# Patient Record
Sex: Male | Born: 1965 | Race: Black or African American | Hispanic: No | Marital: Single | Smoking: Current every day smoker
Health system: Southern US, Community
[De-identification: ages and names within clinical notes are randomized; demographics above are authoritative.]

## PROBLEM LIST (undated history)

## (undated) DIAGNOSIS — N200 Calculus of kidney: Secondary | ICD-10-CM

## (undated) DIAGNOSIS — F329 Major depressive disorder, single episode, unspecified: Secondary | ICD-10-CM

## (undated) DIAGNOSIS — K648 Other hemorrhoids: Secondary | ICD-10-CM

## (undated) DIAGNOSIS — M545 Low back pain, unspecified: Secondary | ICD-10-CM

## (undated) DIAGNOSIS — F32A Depression, unspecified: Secondary | ICD-10-CM

## (undated) DIAGNOSIS — K611 Rectal abscess: Secondary | ICD-10-CM

## (undated) DIAGNOSIS — F419 Anxiety disorder, unspecified: Secondary | ICD-10-CM

## (undated) DIAGNOSIS — G8929 Other chronic pain: Secondary | ICD-10-CM

## (undated) HISTORY — DX: Calculus of kidney: N20.0

## (undated) HISTORY — DX: Rectal abscess: K61.1

## (undated) HISTORY — DX: Other hemorrhoids: K64.8

## (undated) HISTORY — PX: HERNIA REPAIR: SHX51

---

## 2002-03-13 ENCOUNTER — Emergency Department (HOSPITAL_COMMUNITY): Admission: EM | Admit: 2002-03-13 | Discharge: 2002-03-13 | Payer: Self-pay | Admitting: Emergency Medicine

## 2003-06-21 ENCOUNTER — Emergency Department (HOSPITAL_COMMUNITY): Admission: EM | Admit: 2003-06-21 | Discharge: 2003-06-21 | Payer: Self-pay | Admitting: Family Medicine

## 2009-10-07 ENCOUNTER — Emergency Department (HOSPITAL_COMMUNITY): Admission: EM | Admit: 2009-10-07 | Discharge: 2009-10-07 | Payer: Self-pay | Admitting: Emergency Medicine

## 2009-12-19 ENCOUNTER — Emergency Department (HOSPITAL_COMMUNITY): Admission: EM | Admit: 2009-12-19 | Discharge: 2009-12-19 | Payer: Self-pay | Admitting: Emergency Medicine

## 2010-06-29 LAB — COMPREHENSIVE METABOLIC PANEL
CO2: 27 mEq/L (ref 19–32)
Calcium: 9.4 mg/dL (ref 8.4–10.5)
Chloride: 110 mEq/L (ref 96–112)
Creatinine, Ser: 0.83 mg/dL (ref 0.4–1.5)
GFR calc non Af Amer: >60 mL/min (ref >60)
Glucose, Bld: 114 mg/dL — ABNORMAL HIGH (ref 70–99)
Total Bilirubin: 0.6 mg/dL (ref 0.3–1.2)

## 2010-06-29 LAB — DIFFERENTIAL
Basophils Absolute: 0 10*3/uL (ref 0.0–0.1)
Eosinophils Absolute: 0 10*3/uL (ref 0.0–0.7)
Eosinophils Relative: 0 % (ref 0–5)
Lymphocytes Relative: 5 % — ABNORMAL LOW (ref 12–46)
Lymphs Abs: 0.6 10*3/uL — ABNORMAL LOW (ref 0.7–4.0)
Neutrophils Relative %: 93 % — ABNORMAL HIGH (ref 43–77)

## 2010-06-29 LAB — CBC
HCT: 45.5 % (ref 39.0–52.0)
Hemoglobin: 15.5 g/dL (ref 13.0–17.0)
MCH: 31.8 pg (ref 26.0–34.0)
MCHC: 34.1 g/dL (ref 30.0–36.0)
MCV: 93.2 fL (ref 78.0–100.0)
RBC: 4.88 MIL/uL (ref 4.22–5.81)

## 2010-06-29 LAB — URINALYSIS, ROUTINE W REFLEX MICROSCOPIC
Ketones, ur: 40 mg/dL — AB
Leukocytes, UA: NEGATIVE
Nitrite: NEGATIVE
Protein, ur: 30 mg/dL — AB
Urobilinogen, UA: 0.2 mg/dL (ref 0.0–1.0)
pH: 8.5 — ABNORMAL HIGH (ref 5.0–8.0)

## 2010-06-29 LAB — LIPASE, BLOOD: Lipase: 24 U/L (ref 11–59)

## 2010-07-02 LAB — URINALYSIS, ROUTINE W REFLEX MICROSCOPIC
Bilirubin Urine: NEGATIVE
Ketones, ur: NEGATIVE mg/dL
Leukocytes, UA: NEGATIVE
Nitrite: NEGATIVE
Specific Gravity, Urine: 1.024 (ref 1.005–1.030)
Urobilinogen, UA: 1 mg/dL (ref 0.0–1.0)
pH: 6 (ref 5.0–8.0)

## 2010-07-02 LAB — POCT I-STAT, CHEM 8
BUN: 10 mg/dL (ref 6–23)
Calcium, Ion: 1.15 mmol/L (ref 1.12–1.32)
Creatinine, Ser: 1 mg/dL (ref 0.4–1.5)
Hemoglobin: 16 g/dL (ref 13.0–17.0)
Sodium: 142 mEq/L (ref 135–145)
TCO2: 27 mmol/L (ref 0–100)

## 2010-07-02 LAB — URINE CULTURE

## 2010-10-15 HISTORY — PX: INCISION AND DRAINAGE PERIRECTAL ABSCESS: SHX1804

## 2010-10-27 ENCOUNTER — Emergency Department (HOSPITAL_COMMUNITY): Payer: Self-pay

## 2010-10-27 ENCOUNTER — Inpatient Hospital Stay (HOSPITAL_COMMUNITY)
Admission: EM | Admit: 2010-10-27 | Discharge: 2010-10-29 | DRG: 349 | Disposition: A | Discharge status: Home / Self Care | Payer: Self-pay | Attending: General Surgery | Admitting: General Surgery

## 2010-10-27 DIAGNOSIS — K219 Gastro-esophageal reflux disease without esophagitis: Secondary | ICD-10-CM | POA: Diagnosis present

## 2010-10-27 DIAGNOSIS — K649 Unspecified hemorrhoids: Secondary | ICD-10-CM | POA: Diagnosis present

## 2010-10-27 DIAGNOSIS — K612 Anorectal abscess: Secondary | ICD-10-CM

## 2010-10-27 DIAGNOSIS — F17200 Nicotine dependence, unspecified, uncomplicated: Secondary | ICD-10-CM | POA: Diagnosis present

## 2010-10-27 DIAGNOSIS — K5900 Constipation, unspecified: Secondary | ICD-10-CM | POA: Diagnosis present

## 2010-10-27 LAB — COMPREHENSIVE METABOLIC PANEL
AST: 13 U/L (ref 0–37)
CO2: 26 mEq/L (ref 19–32)
Calcium: 9.1 mg/dL (ref 8.4–10.5)
Chloride: 102 mEq/L (ref 96–112)
Creatinine, Ser: 0.85 mg/dL (ref 0.50–1.35)
GFR calc Af Amer: >60 mL/min (ref >60)
GFR calc non Af Amer: >60 mL/min (ref >60)
Glucose, Bld: 100 mg/dL — ABNORMAL HIGH (ref 70–99)
Total Bilirubin: 0.3 mg/dL (ref 0.3–1.2)

## 2010-10-27 LAB — CBC
HCT: 40.3 % (ref 39.0–52.0)
Hemoglobin: 14.4 g/dL (ref 13.0–17.0)
MCH: 32.2 pg (ref 26.0–34.0)
MCV: 90.2 fL (ref 78.0–100.0)
Platelets: 174 10*3/uL (ref 150–400)
RBC: 4.47 MIL/uL (ref 4.22–5.81)
WBC: 19.6 10*3/uL — ABNORMAL HIGH (ref 4.0–10.5)

## 2010-10-27 LAB — DIFFERENTIAL
Eosinophils Absolute: 0 10*3/uL (ref 0.0–0.7)
Lymphocytes Relative: 9 % — ABNORMAL LOW (ref 12–46)
Lymphs Abs: 1.8 10*3/uL (ref 0.7–4.0)
Monocytes Relative: 6 % (ref 3–12)
Neutro Abs: 16.5 10*3/uL — ABNORMAL HIGH (ref 1.7–7.7)
Neutrophils Relative %: 84 % — ABNORMAL HIGH (ref 43–77)

## 2010-10-28 MED ORDER — IOHEXOL 300 MG/ML  SOLN
100.0000 mL | Freq: Once | INTRAMUSCULAR | Status: AC | PRN
  Administered 2010-10-28: 100 mL via INTRAVENOUS

## 2010-10-28 NOTE — H&P (Signed)
  NAME:  Daniel [name], Daniel [name] NO.:  [id number]  MEDICAL RECORD NO.:  [id number]  LOCATION:  5125                         FACILITY:  [facility]  PHYSICIAN:  Juanetta Gosling, MDDATE OF BIRTH:  1966-03-24  DATE OF ADMISSION:  10/27/2010 DATE OF DISCHARGE:                             HISTORY & PHYSICAL   CONSULTING PHYSICIAN:  Bethann Berkshire, MD  CHIEF COMPLAINT:  Perirectal pain.  HISTORY OF PRESENT ILLNESS:  This a 45 year old otherwise fairly healthy male, has a history of hemorrhoids from which he mainly reduces.  These have been irritating for a number of years and have been unchanged.  For the past 3-4 days, he has developed some significant increased perirectal pain associated with subjective fevers.  He is attempted to pack his rectum with Vaseline as well as some home remedies for hemorrhoids, which have not really helped.  He is still having bowel movements, but he is very nervous do that.  He is having significant amount of pain.  He came in the night due to the fact that he had continued perirectal pain.  Nothing was relieving his pain and it was being aggravated by bowel movements and palpation and sitting.  PAST MEDICAL HISTORY:  Negative.  PAST SURGICAL HISTORY:  Hernia repair as an infant.  MEDICATIONS:  None.  He has no known drug allergies.  SOCIAL HISTORY:  He is a smoker and does drink occasionally.  He works in [employer].  REVIEW OF SYSTEMS:  Otherwise is negative.  PHYSICAL EXAMINATION:  VITAL SIGNS:  99.3, 73, 131/88, 20, and 100%. GENERAL:  He is a well-developed, well-nourished male in no apparent distress. NECK:  Supple without adenopathy. RESPIRATORY:  Clear bilaterally. HEART:  Regular rate and rhythm. ABDOMEN:  Soft, nontender, nondistended.  Bowel sounds present. RECTAL:  I am unable to really do a digital rectal exam due to pain, but he does have a palpable fluctuant mass at 9 o'clock with no erythema that is  noted.  LABORATORY EVALUATION:  White blood cell count of 19.6 with a left shift, hematocrit 40.3, platelets 174.  BUN 11, creatinine 0.85, potassium 3.4.  LFTs are normal.  He underwent a CT scan with the emergency room prior to my arrival, which shows a 2.9-cm perirectal abscess on the left side.  IMPRESSION:  Perirectal abscess.  PLAN:  We discussed I do not think he is going to be able to tolerate this in the emergency room, so we discussed a incision and drainage in the operating room.  I discussed he is going to be on antibiotics postoperatively, and we would leave this wound open.  We discussed the risks of procedure.  He also has a small amount of free fluid in his pelvis.  This may just be from the inflammation that is present as well, so we will plan on draining this placing on some antibiotics and following from there.     Juanetta Gosling, MD     MCW/MEDQ  D:  10/28/2010  T:  10/28/2010  Job:  [id number]  cc:   Bethann Berkshire, MD  Electronically Signed by Emelia Loron MD on 10/28/2010 06:58:15 PM

## 2010-10-28 NOTE — Op Note (Signed)
  NAME:  DARE, SANGER NO.:  [id number]  MEDICAL RECORD NO.:  [id number]  LOCATION:  5125                         FACILITY:  [facility]  PHYSICIAN:  Juanetta Gosling, MDDATE OF BIRTH:  [birth year]  DATE OF PROCEDURE:  10/28/2010 DATE OF DISCHARGE:                              OPERATIVE REPORT   PREOPERATIVE DIAGNOSIS:  Perirectal abscess.  POSTOPERATIVE DIAGNOSIS:  Perirectal abscess.  PROCEDURE:  Incision and drainage of perirectal abscess.  SURGEON:  Juanetta Gosling, MD  ASSISTANT:  None.  SUPERVISING ANESTHESIOLOGIST:  Bedelia Person, MD  ANESTHESIA:  General.  SPECIMEN:  Cultures to microbiology.  ESTIMATED BLOOD LOSS:  Minimal.  COMPLICATIONS:  None.  DISPOSITION:  The patient to recovery room in stable condition.  INDICATIONS:  A 45 year old male who is otherwise fairly healthy with a history of hemorrhoids with several days of perirectal pain.  On his exam, it could not really identify anything.  He did on the CT scan, however, have a left-sided perirectal abscess that was several centimeters inside of his rectum.  I discussed taking him to the operating room for anoscopy, incision and drainage.  PROCEDURE:  After informed consent was obtained, the patient was taken to the operating room.  He was administered 3 g of Unasyn prior to going to the operating room.  Sequential compression devices were placed on lower extremities prior to induction with anesthesia, was then placed under general anesthesia without complication.  He was rolled into the prone position and appropriately positioned and padded.  He was then prepped and draped in standard sterile surgical fashion.  A surgical time-out was then performed.  I dilated the anus with two fingers.  I then inserted the anoscope. There was nothing really external to be able to identify this.  Upon inserting the anoscope, and doing an digital examination there was certainly a palpable abscess  about 3 cm from the anal verge that when I palpated it, there was a lot of purulence that came transrectally from this area.  I did obtain cultures of this.  He had some very large hemorrhoids which made them somewhat difficult as well, but I was able to be pretty clearly see and felt that the abscess was drained for the most part.  I was somewhat concerned so I did make an incision in a radial fashion at about 10 o'clock.  I then dissected down to the area of the abscess to ensure there was no further purulence present.  I did not get any purulence out of this and so my feeling was that it drained completely transrectally, I did connect this area.  I did pack this and irrigated as well.  He tolerated this well.  I did do an anal block with 0.25% Marcaine upon completion.  The sterile dressing was placed.  He was extubated in the operating room and transferred to recovery room in stable condition.     Juanetta Gosling, MD     MCW/MEDQ  D:  10/28/2010  T:  10/28/2010  Job:  [id number]  Electronically Signed by Emelia Loron MD on 10/28/2010 06:58:23 PM

## 2010-10-29 LAB — CBC
Hemoglobin: 13.1 g/dL (ref 13.0–17.0)
MCHC: 34.8 g/dL (ref 30.0–36.0)
RDW: 12.9 % (ref 11.5–15.5)
WBC: 13.3 10*3/uL — ABNORMAL HIGH (ref 4.0–10.5)

## 2010-10-30 LAB — CULTURE, ROUTINE-ABSCESS

## 2010-11-02 LAB — ANAEROBIC CULTURE

## 2010-11-17 ENCOUNTER — Ambulatory Visit (INDEPENDENT_AMBULATORY_CARE_PROVIDER_SITE_OTHER): Payer: Self-pay | Admitting: General Surgery

## 2010-11-17 ENCOUNTER — Encounter (INDEPENDENT_AMBULATORY_CARE_PROVIDER_SITE_OTHER): Payer: Self-pay | Admitting: General Surgery

## 2010-11-17 DIAGNOSIS — K648 Other hemorrhoids: Secondary | ICD-10-CM | POA: Insufficient documentation

## 2010-11-17 DIAGNOSIS — K611 Rectal abscess: Secondary | ICD-10-CM | POA: Insufficient documentation

## 2010-11-17 DIAGNOSIS — K612 Anorectal abscess: Secondary | ICD-10-CM

## 2010-11-17 DIAGNOSIS — Z09 Encounter for follow-up examination after completed treatment for conditions other than malignant neoplasm: Secondary | ICD-10-CM

## 2010-11-17 MED ORDER — HYDROCORTISONE ACETATE 25 MG RE SUPP: 25.0000 mg | Freq: Two times a day (BID) | RECTAL | Status: AC

## 2010-11-17 NOTE — Progress Notes (Signed)
Subjective:     Patient ID: Daniel [name], male   DOB: [birth year], 45 y.o.   MRN: [id number]  HPI This is a 45 year old male I saw on call at [facility]. He had a perirectal abscess. I took him to the operating room and performed an exam under anesthesia. His perirectal abscess  drained transrectally. I did make an incision on the right side of his anus and was able to ensure that this was adequately drained and left is packed. This is now completely healed. He only notices a small lump at the site of his incision or scar is right now. He otherwise is having bowel movements normally. She reports no fevers. He does still however have bright red blood per rectum that he describes as coming out like water after he has a bowel movement. He does have a long history of hemorrhoids he did have some moderate internal hemorrhoids upon my examination but not entirely sure that this is completely the source of his bleeding.  Review of Systems     Objective:   Physical Exam No external hemorrhoids, incision healed    Assessment:     Internal hemorrhoids BRBPR S/p perirectal abscess I and D    Plan:       He is aware complaints after his abscess incision and drainage and from that standpoint he can return to do all of his normal activity. I am going to return him to full duty at work on Monday.  I think his bleeding is coming from his internal hemorrhoids at this point. I am giving given some Anusol suppositories. I also recommended he see gastroenterology for consideration of a colonoscopy.

## 2010-11-22 ENCOUNTER — Encounter: Payer: Self-pay | Admitting: Internal Medicine

## 2010-11-23 NOTE — Discharge Summary (Signed)
  NAME:  Daniel [name], Daniel [name] NO.:  [id number]  MEDICAL RECORD NO.:  [id number]  LOCATION:  5125                         FACILITY:  [facility]  PHYSICIAN:  Juanetta Gosling, MDDATE OF BIRTH:  December 05, 1965  DATE OF ADMISSION:  10/27/2010 DATE OF DISCHARGE:  10/29/2010                              DISCHARGE SUMMARY   DISCHARGE DIAGNOSES: 1. Perirectal abscess. 2. Hemorrhoids.  CONSULTANTS:  None.  PROCEDURES:  I and D perirectal abscess by Dr. Dwain Sarna.  HISTORY OF PRESENT ILLNESS:  This is a 45 year old black male who came in with significant perirectal pain with subjective fevers.  Examination revealed a perirectal abscess.  He was taken to the operating room where he had I and D and packing.  He has been transferred to floor for further care.  HOSPITAL COURSE:  The patient did well in the hospital.  He was able to tolerate a regular diet and did not have any problems with his wound. Packing was removed on the day of discharge without difficulty.  His white blood cell count had decreased from 19 to about 13.  He was able to be discharged home in good condition.  The patient did complain of significant constipation and wanted something to aid that.  I suggested to try some magnesium citrate and he is going to get that as an outpatient and tried at home.  DISCHARGE MEDICATIONS: 1. Percocet 5/325 take 2 p.o. q.4 hours p.r.n. pain #60, with no     refill. 2. Phenergan 25 mg take 1 p.o. q.6 hours p.r.n. nausea #30 with no     refill. 3. Augmentin 875 mg take 1 p.o. b.i.d. x7 days #14 with no refill. 4. Magnesium citrate 300 mL by mouth x1. 5. In addition, he may resume his Benadryl 25 mg by mouth daily as     needed for allergies. 6. Ibuprofen 200 mg 1-2 tablets by mouth every 8 hours as needed for     pain.  FOLLOWUP:  The patient will need to follow up with Dr. Dwain Sarna, and we will call the office for an appointment.     Earney Hamburg,  P.A.   ______________________________ Juanetta Gosling, MD    MJ/MEDQ  D:  10/29/2010  T:  10/29/2010  Job:  [id number]  Electronically Signed by Charma Igo P.A. on 11/15/2010 04:07:14 PM Electronically Signed by Emelia Loron MD on 11/23/2010 01:59:59 PM

## 2010-12-15 ENCOUNTER — Ambulatory Visit: Payer: Self-pay | Admitting: Internal Medicine

## 2012-03-08 ENCOUNTER — Encounter (HOSPITAL_COMMUNITY): Payer: Self-pay | Admitting: Emergency Medicine

## 2012-03-08 ENCOUNTER — Emergency Department (HOSPITAL_COMMUNITY)
Admission: EM | Admit: 2012-03-08 | Discharge: 2012-03-08 | Disposition: A | Discharge status: Home / Self Care | Payer: Self-pay | Attending: Emergency Medicine | Admitting: Emergency Medicine

## 2012-03-08 DIAGNOSIS — F17200 Nicotine dependence, unspecified, uncomplicated: Secondary | ICD-10-CM | POA: Insufficient documentation

## 2012-03-08 DIAGNOSIS — R197 Diarrhea, unspecified: Secondary | ICD-10-CM | POA: Insufficient documentation

## 2012-03-08 DIAGNOSIS — Z8719 Personal history of other diseases of the digestive system: Secondary | ICD-10-CM | POA: Insufficient documentation

## 2012-03-08 DIAGNOSIS — R112 Nausea with vomiting, unspecified: Secondary | ICD-10-CM | POA: Insufficient documentation

## 2012-03-08 DIAGNOSIS — E860 Dehydration: Secondary | ICD-10-CM | POA: Insufficient documentation

## 2012-03-08 DIAGNOSIS — Z9889 Other specified postprocedural states: Secondary | ICD-10-CM | POA: Insufficient documentation

## 2012-03-08 DIAGNOSIS — R1013 Epigastric pain: Secondary | ICD-10-CM | POA: Insufficient documentation

## 2012-03-08 LAB — COMPREHENSIVE METABOLIC PANEL
ALT: 16 U/L (ref 0–53)
Alkaline Phosphatase: 80 U/L (ref 39–117)
CO2: 26 mEq/L (ref 19–32)
Chloride: 103 mEq/L (ref 96–112)
GFR calc Af Amer: >90 mL/min (ref >90)
GFR calc non Af Amer: >90 mL/min (ref >90)
Glucose, Bld: 134 mg/dL — ABNORMAL HIGH (ref 70–99)
Potassium: 3.8 mEq/L (ref 3.5–5.1)
Sodium: 141 mEq/L (ref 135–145)
Total Bilirubin: 0.5 mg/dL (ref 0.3–1.2)

## 2012-03-08 LAB — CBC WITH DIFFERENTIAL/PLATELET
Hemoglobin: 15.6 g/dL (ref 13.0–17.0)
Lymphocytes Relative: 11 % — ABNORMAL LOW (ref 12–46)
Lymphs Abs: 1.5 10*3/uL (ref 0.7–4.0)
Neutrophils Relative %: 86 % — ABNORMAL HIGH (ref 43–77)
Platelets: 238 10*3/uL (ref 150–400)
RBC: 5 MIL/uL (ref 4.22–5.81)
WBC: 13.6 10*3/uL — ABNORMAL HIGH (ref 4.0–10.5)

## 2012-03-08 LAB — URINE MICROSCOPIC-ADD ON

## 2012-03-08 LAB — URINALYSIS, ROUTINE W REFLEX MICROSCOPIC
Glucose, UA: NEGATIVE mg/dL
Hgb urine dipstick: NEGATIVE
Specific Gravity, Urine: 1.021 (ref 1.005–1.030)
pH: 8 (ref 5.0–8.0)

## 2012-03-08 MED ORDER — SODIUM CHLORIDE 0.9 % IV BOLUS (SEPSIS)
1000.0000 mL | Freq: Once | INTRAVENOUS | Status: AC
  Administered 2012-03-08: 1000 mL via INTRAVENOUS

## 2012-03-08 MED ORDER — MORPHINE SULFATE 4 MG/ML IJ SOLN
4.0000 mg | Freq: Once | INTRAMUSCULAR | Status: AC
  Administered 2012-03-08: 4 mg via INTRAVENOUS
  Filled 2012-03-08: qty 1

## 2012-03-08 MED ORDER — PROMETHAZINE HCL 25 MG/ML IJ SOLN
25.0000 mg | Freq: Once | INTRAMUSCULAR | Status: AC
  Administered 2012-03-08: 25 mg via INTRAVENOUS
  Filled 2012-03-08: qty 1

## 2012-03-08 MED ORDER — OMEPRAZOLE 20 MG PO CPDR: 40.0000 mg | DELAYED_RELEASE_CAPSULE | Freq: Every day | ORAL | Status: DC

## 2012-03-08 MED ORDER — ONDANSETRON HCL 4 MG/2ML IJ SOLN
4.0000 mg | Freq: Once | INTRAMUSCULAR | Status: AC
  Administered 2012-03-08: 4 mg via INTRAVENOUS

## 2012-03-08 MED ORDER — ONDANSETRON HCL 4 MG/2ML IJ SOLN
INTRAMUSCULAR | Status: AC
  Filled 2012-03-08: qty 2

## 2012-03-08 MED ORDER — FENTANYL CITRATE 0.05 MG/ML IJ SOLN
50.0000 ug | Freq: Once | INTRAMUSCULAR | Status: AC
  Administered 2012-03-08: 50 ug via INTRAVENOUS

## 2012-03-08 MED ORDER — FAMOTIDINE IN NACL 20-0.9 MG/50ML-% IV SOLN
20.0000 mg | Freq: Once | INTRAVENOUS | Status: AC
  Administered 2012-03-08: 20 mg via INTRAVENOUS
  Filled 2012-03-08: qty 50

## 2012-03-08 MED ORDER — ONDANSETRON HCL 4 MG PO TABS: 4.0000 mg | ORAL_TABLET | Freq: Four times a day (QID) | ORAL | Status: DC

## 2012-03-08 MED ORDER — FENTANYL CITRATE 0.05 MG/ML IJ SOLN
INTRAMUSCULAR | Status: AC
  Filled 2012-03-08: qty 2

## 2012-03-08 NOTE — ED Notes (Signed)
Pt had episode of hematemesis while in room, prior to being given 4mg  of Zofran.

## 2012-03-08 NOTE — ED Notes (Signed)
Pt reports central abd pain with n/v/d since yesterday

## 2012-03-08 NOTE — ED Provider Notes (Signed)
History     CSN: [id number]  Arrival date & time 03/08/12  0126   First MD Initiated Contact with Patient 03/08/12 0250      Chief Complaint  Patient presents with  . Abdominal Pain    (Consider location/radiation/quality/duration/timing/severity/associated sxs/prior treatment) HPI  Please note that this is a late entry.  The patient is a 46 year old man in generally good health. He presents with complaints of diarrhea for the past 36 hours. His stools have been watery and nonbloody. He says that he has had innumerable episodes of diarrhea. In addition, he has developed some cramping midline epigastric pain. He is nauseated. He has vomited twice. He denies fever. He has a remote history of hernia repair but denies any other surgeries. He denies genitourinary symptoms. Known ill contacts.  The patient describes his epigastric pain as moderately severe and nonradiating. He has been able to tolerate fluids. He has not appreciated excacerbating or relieving factors with regards to his pain.   Past Medical History  Diagnosis Date  . Rectal abscess   . Internal hemorrhoids with other complication     Past Surgical History  Procedure Date  . Incision and drainage perirectal abscess   . Hernia repair     History reviewed. No pertinent family history.  History  Substance Use Topics  . Smoking status: Current Every Day Smoker -- 0.5 packs/day    Types: Cigarettes  . Smokeless tobacco: Not on file  . Alcohol Use: Yes     Comment: occasional      Review of Systems Gen: no weight loss, fevers, chills, night sweats Eyes: no discharge or drainage, no occular pain or visual changes Nose: no epistaxis or rhinorrhea Mouth: no dental pain, no sore throat Neck: no neck pain Lungs: no SOB, cough, wheezing CV: no chest pain, palpitations, dependent edema or orthopnea Abd: As per history of present illness, otherwise negative GU: no dysuria or gross hematuria MSK: no myalgias or  arthralgias Neuro: no headache, no focal neurologic deficits Skin: no rash Psyche: negative.  Allergies  Review of patient's allergies indicates no known allergies.  Home Medications  No current outpatient prescriptions on file.  BP 133/90  Pulse 73  Temp 97.8 F (36.6 C) (Oral)  Resp 20  SpO2 99%  Physical Exam Gen: well developed and well nourished appearing Head: NCAT Eyes: PERL, EOMI Nose: no epistaixis or rhinorrhea Mouth/throat: mucosa is moist and pink Neck: supple, no stridor Lungs: CTA B, no wheezing, rhonchi or rales CV: Regular rate and rhythm, no murmur, extremities are well perfused, no extremity edema. Abd: soft, notender, nondistended Back: no ttp, no cva ttp Skin: no rashese, wnl Neuro: CN ii-xii grossly intact, no focal deficits Psyche; normal affect,  calm and cooperative.   ED Course  Procedures (including critical care time)  Results for orders placed during the hospital encounter of 03/08/12 (from the past 24 hour(s))  CBC WITH DIFFERENTIAL     Status: Abnormal   Collection Time   03/08/12  2:01 AM      Component Value Range   WBC 13.6 (*) 4.0 - 10.5 K/uL   RBC 5.00  4.22 - 5.81 MIL/uL   Hemoglobin 15.6  13.0 - 17.0 g/dL   HCT 14.7  82.9 - 56.2 %   MCV 91.2  78.0 - 100.0 fL   MCH 31.2  26.0 - 34.0 pg   MCHC 34.2  30.0 - 36.0 g/dL   RDW 13.0  86.5 - 78.4 %   Platelets  238  150 - 400 K/uL   Neutrophils Relative 86 (*) 43 - 77 %   Neutro Abs 11.7 (*) 1.7 - 7.7 K/uL   Lymphocytes Relative 11 (*) 12 - 46 %   Lymphs Abs 1.5  0.7 - 4.0 K/uL   Monocytes Relative 3  3 - 12 %   Monocytes Absolute 0.4  0.1 - 1.0 K/uL   Eosinophils Relative 0  0 - 5 %   Eosinophils Absolute 0.0  0.0 - 0.7 K/uL   Basophils Relative 0  0 - 1 %   Basophils Absolute 0.0  0.0 - 0.1 K/uL  COMPREHENSIVE METABOLIC PANEL     Status: Abnormal   Collection Time   03/08/12  2:01 AM      Component Value Range   Sodium 141  135 - 145 mEq/L   Potassium 3.8  3.5 - 5.1 mEq/L     Chloride 103  96 - 112 mEq/L   CO2 26  19 - 32 mEq/L   Glucose, Bld 134 (*) 70 - 99 mg/dL   BUN 9  6 - 23 mg/dL   Creatinine, Ser 3.08  0.50 - 1.35 mg/dL   Calcium 65.7  8.4 - 84.6 mg/dL   Total Protein 8.6 (*) 6.0 - 8.3 g/dL   Albumin 4.5  3.5 - 5.2 g/dL   AST 21  0 - 37 U/L   ALT 16  0 - 53 U/L   Alkaline Phosphatase 80  39 - 117 U/L   Total Bilirubin 0.5  0.3 - 1.2 mg/dL   GFR calc non Af Amer >90  >90 mL/min   GFR calc Af Amer >90  >90 mL/min  LIPASE, BLOOD     Status: Normal   Collection Time   03/08/12  2:01 AM      Component Value Range   Lipase 25  11 - 59 U/L  URINALYSIS, ROUTINE W REFLEX MICROSCOPIC     Status: Abnormal   Collection Time   03/08/12  3:56 AM      Component Value Range   Color, Urine YELLOW  YELLOW   APPearance TURBID (*) CLEAR   Specific Gravity, Urine 1.021  1.005 - 1.030   pH 8.0  5.0 - 8.0   Glucose, UA NEGATIVE  NEGATIVE mg/dL   Hgb urine dipstick NEGATIVE  NEGATIVE   Bilirubin Urine NEGATIVE  NEGATIVE   Ketones, ur 15 (*) NEGATIVE mg/dL   Protein, ur 30 (*) NEGATIVE mg/dL   Urobilinogen, UA 1.0  0.0 - 1.0 mg/dL   Nitrite NEGATIVE  NEGATIVE   Leukocytes, UA SMALL (*) NEGATIVE  URINE MICROSCOPIC-ADD ON     Status: Abnormal   Collection Time   03/08/12  3:56 AM      Component Value Range   Squamous Epithelial / LPF FEW (*) RARE   WBC, UA 3-6  <3 WBC/hpf   RBC / HPF 3-6  <3 RBC/hpf   Bacteria, UA FEW (*) RARE   Casts GRANULAR CAST (*) NEGATIVE   Urine-Other AMORPHOUS URATES/PHOSPHATES      MDM  Ddx: acute pancreatitis, viral gastroenteritis, gall bladder disease, UTI/pyelonephritis, ibd, ibs, colitis, hepatitis.   Generally healthy 46 yo man with 36 h of watery diarrhea and a couple episodes of vomiting. Abdominal exam is benign. Patient is feeling better after tx with NS, antiemetic and pain medication. His labs are notable for modest leukocytosis and ketones in urine. We will tx with 2nd liter of NS and po challenge. Hepatitis, UTI,  and pancreatitis have been ruled out. The patient's repeat abdominal exam is benign. I do not feel that emergent imaging is indicated.  Anticipate d/c home with plan for continued symptomatic management, close outpatient follow up and counsel to return to the ED for red flag sx.         Brandt Loosen, MD 03/08/12 [phone]

## 2012-03-09 LAB — URINE CULTURE

## 2012-07-14 ENCOUNTER — Emergency Department (HOSPITAL_COMMUNITY)
Admission: EM | Admit: 2012-07-14 | Discharge: 2012-07-14 | Disposition: A | Discharge status: Home / Self Care | Payer: Self-pay | Attending: Emergency Medicine | Admitting: Emergency Medicine

## 2012-07-14 ENCOUNTER — Encounter (HOSPITAL_COMMUNITY): Payer: Self-pay | Admitting: *Deleted

## 2012-07-14 DIAGNOSIS — R112 Nausea with vomiting, unspecified: Secondary | ICD-10-CM | POA: Insufficient documentation

## 2012-07-14 DIAGNOSIS — F17200 Nicotine dependence, unspecified, uncomplicated: Secondary | ICD-10-CM | POA: Insufficient documentation

## 2012-07-14 DIAGNOSIS — Z872 Personal history of diseases of the skin and subcutaneous tissue: Secondary | ICD-10-CM | POA: Insufficient documentation

## 2012-07-14 DIAGNOSIS — R109 Unspecified abdominal pain: Secondary | ICD-10-CM

## 2012-07-14 DIAGNOSIS — R197 Diarrhea, unspecified: Secondary | ICD-10-CM | POA: Insufficient documentation

## 2012-07-14 DIAGNOSIS — R1084 Generalized abdominal pain: Secondary | ICD-10-CM | POA: Insufficient documentation

## 2012-07-14 DIAGNOSIS — Z8679 Personal history of other diseases of the circulatory system: Secondary | ICD-10-CM | POA: Insufficient documentation

## 2012-07-14 DIAGNOSIS — R51 Headache: Secondary | ICD-10-CM | POA: Insufficient documentation

## 2012-07-14 LAB — URINALYSIS, ROUTINE W REFLEX MICROSCOPIC
Bilirubin Urine: NEGATIVE
Glucose, UA: NEGATIVE mg/dL
Hgb urine dipstick: NEGATIVE
Ketones, ur: 40 mg/dL — AB
Protein, ur: 30 mg/dL — AB
pH: 8.5 — ABNORMAL HIGH (ref 5.0–8.0)

## 2012-07-14 LAB — COMPREHENSIVE METABOLIC PANEL
ALT: 18 U/L (ref 0–53)
AST: 24 U/L (ref 0–37)
Alkaline Phosphatase: 93 U/L (ref 39–117)
CO2: 27 mEq/L (ref 19–32)
Chloride: 105 mEq/L (ref 96–112)
Creatinine, Ser: 0.82 mg/dL (ref 0.50–1.35)
GFR calc non Af Amer: >90 mL/min (ref >90)
Potassium: 4 mEq/L (ref 3.5–5.1)
Total Bilirubin: 0.4 mg/dL (ref 0.3–1.2)

## 2012-07-14 LAB — CBC WITH DIFFERENTIAL/PLATELET
Basophils Absolute: 0 10*3/uL (ref 0.0–0.1)
HCT: 47.1 % (ref 39.0–52.0)
Hemoglobin: 16.4 g/dL (ref 13.0–17.0)
Lymphocytes Relative: 5 % — ABNORMAL LOW (ref 12–46)
Monocytes Absolute: 0.2 10*3/uL (ref 0.1–1.0)
Neutro Abs: 13 10*3/uL — ABNORMAL HIGH (ref 1.7–7.7)
Neutrophils Relative %: 94 % — ABNORMAL HIGH (ref 43–77)
RDW: 13.2 % (ref 11.5–15.5)
WBC: 13.9 10*3/uL — ABNORMAL HIGH (ref 4.0–10.5)

## 2012-07-14 LAB — URINE MICROSCOPIC-ADD ON

## 2012-07-14 MED ORDER — SODIUM CHLORIDE 0.9 % IV BOLUS (SEPSIS)
1000.0000 mL | Freq: Once | INTRAVENOUS | Status: AC
  Administered 2012-07-14: 1000 mL via INTRAVENOUS

## 2012-07-14 MED ORDER — ONDANSETRON HCL 4 MG/2ML IJ SOLN
4.0000 mg | Freq: Once | INTRAMUSCULAR | Status: AC
  Administered 2012-07-14: 4 mg via INTRAVENOUS
  Filled 2012-07-14: qty 2

## 2012-07-14 MED ORDER — PROMETHAZINE HCL 50 MG PO TABS: 50.0000 mg | ORAL_TABLET | Freq: Four times a day (QID) | ORAL | Status: DC | PRN

## 2012-07-14 MED ORDER — OXYCODONE-ACETAMINOPHEN 5-325 MG PO TABS
2.0000 | ORAL_TABLET | Freq: Once | ORAL | Status: AC
  Administered 2012-07-14: 2 via ORAL
  Filled 2012-07-14: qty 2

## 2012-07-14 MED ORDER — ONDANSETRON 4 MG PO TBDP
8.0000 mg | ORAL_TABLET | Freq: Once | ORAL | Status: AC
  Administered 2012-07-14: 8 mg via ORAL
  Filled 2012-07-14: qty 2

## 2012-07-14 MED ORDER — PROMETHAZINE HCL 25 MG/ML IJ SOLN
12.5000 mg | Freq: Once | INTRAMUSCULAR | Status: AC
  Administered 2012-07-14: 12.5 mg via INTRAVENOUS
  Filled 2012-07-14: qty 1

## 2012-07-14 MED ORDER — MORPHINE SULFATE 4 MG/ML IJ SOLN
2.0000 mg | Freq: Once | INTRAMUSCULAR | Status: AC
  Administered 2012-07-14: 4 mg via INTRAVENOUS

## 2012-07-14 NOTE — ED Notes (Signed)
Pt with acute, diffuse abd pain, diarrhea emesis and headache at 4 am.  Pt states he is dry heaving b/c he has nothing left to vomit.

## 2012-07-14 NOTE — ED Provider Notes (Signed)
Medical screening examination/treatment/procedure(s) were performed by non-physician practitioner and as supervising physician I was immediately available for consultation/collaboration.  Doug Sou, MD 07/14/12 [phone]

## 2012-07-14 NOTE — ED Notes (Signed)
Pt vomiting.

## 2012-07-14 NOTE — ED Provider Notes (Signed)
History     CSN: [id number]  Arrival date & time 07/14/12  1147   First MD Initiated Contact with Patient 07/14/12 1216      Chief Complaint  Patient presents with  . Abdominal Pain    (Consider location/radiation/quality/duration/timing/severity/associated sxs/prior treatment) HPI  Daniel [name] is a 47 y.o. male complaining of watery diarrhea onset this a.m. At 4:30 this was followed by nausea and vomiting, and subsequent diffuse abdominal pain. Patient denies fever, sick contacts, history of prior abdominal surgeries. Emesis has been nonbloody, nonbilious, non-coffee ground. Patient also endorses a generalized headache rated at 6/10.  Past Medical History  Diagnosis Date  . Rectal abscess   . Internal hemorrhoids with other complication     Past Surgical History  Procedure Laterality Date  . Incision and drainage perirectal abscess    . Hernia repair      No family history on file.  History  Substance Use Topics  . Smoking status: Current Every Day Smoker -- 0.50 packs/day    Types: Cigarettes  . Smokeless tobacco: Not on file  . Alcohol Use: Yes     Comment: occasional      Review of Systems  Constitutional: Negative for fever.  Respiratory: Negative for shortness of breath.   Cardiovascular: Negative for chest pain.  Gastrointestinal: Positive for nausea, vomiting, abdominal pain and diarrhea.  All other systems reviewed and are negative.    Allergies  Review of patient's allergies indicates no known allergies.  Home Medications   Current Outpatient Rx  Name  Route  Sig  Dispense  Refill  . omeprazole (PRILOSEC) 20 MG capsule   Oral   Take 2 capsules (40 mg total) by mouth daily.   30 capsule   0   . ondansetron (ZOFRAN) 4 MG tablet   Oral   Take 1 tablet (4 mg total) by mouth every 6 (six) hours.   12 tablet   0     BP 131/77  Pulse 61  Temp(Src) 97.4 F (36.3 C) (Oral)  Resp 22  Ht 5\' 9"  (1.753 m)  Wt 150 lb (68.04 kg)  BMI 22.14  kg/m2  SpO2 100%  Physical Exam  Nursing note and vitals reviewed. Constitutional: He is oriented to person, place, and time. He appears well-developed and well-nourished. No distress.  HENT:  Head: Normocephalic.  Mouth/Throat: Oropharynx is clear and moist.  Eyes: Conjunctivae and EOM are normal. Pupils are equal, round, and reactive to light.  Neck: Normal range of motion.  Cardiovascular: Normal rate, regular rhythm and intact distal pulses.   Pulmonary/Chest: Effort normal and breath sounds normal. No stridor. No respiratory distress. He has no wheezes. He has no rales. He exhibits no tenderness.  Abdominal: Soft. He exhibits no distension and no mass. There is tenderness. There is no rebound and no guarding.  Hyperactive bowel sounds, diffuse, mild, tenderness to palpation with no guarding or rebound.  Musculoskeletal: Normal range of motion.  Neurological: He is alert and oriented to person, place, and time.  Psychiatric: He has a normal mood and affect.    ED Course  Procedures (including critical care time)  Labs Reviewed  CBC WITH DIFFERENTIAL - Abnormal; Notable for the following:    WBC 13.9 (*)    Neutrophils Relative 94 (*)    Neutro Abs 13.0 (*)    Lymphocytes Relative 5 (*)    Monocytes Relative 1 (*)    All other components within normal limits  COMPREHENSIVE METABOLIC PANEL - Abnormal;  Notable for the following:    Glucose, Bld 120 (*)    Total Protein 8.9 (*)    All other components within normal limits  URINALYSIS, ROUTINE W REFLEX MICROSCOPIC - Abnormal; Notable for the following:    pH 8.5 (*)    Ketones, ur 40 (*)    Protein, ur 30 (*)    Leukocytes, UA SMALL (*)    All other components within normal limits  URINE MICROSCOPIC-ADD ON - Abnormal; Notable for the following:    Bacteria, UA FEW (*)    All other components within normal limits  URINE CULTURE  LIPASE, BLOOD   No results found.  1505 patient seen and examined at the bedside. He states  that the abdominal pain is now gone. He does report a lingering nausea. Abdominal exam remains nonsurgical with mild diffuse tenderness to palpation, but abdominal sounds, no guarding or rebound.   1. Nausea vomiting and diarrhea   2. Abdominal pain       MDM   Daniel [name] is a 47 y.o. male with nausea vomiting diarrhea abdominal pain.  Patient has no electrolyte abnormalities, CBC is remarkable for leukocytosis of 13.9. Urinalysis has 11-20 white blood cells and small leukocytes.  Patient is tolerating by mouth. Serial abdominal exams remained benign. Vital signs are stable and patient is amenable to discharge at this time. Return precautions given.   Filed Vitals:   07/14/12 1152  BP: 131/77  Pulse: 61  Temp: 97.4 F (36.3 C)  TempSrc: Oral  Resp: 22  Height: 5\' 9"  (1.753 m)  Weight: 150 lb (68.04 kg)  SpO2: 100%     Pt verbalized understanding and agrees with care plan. Outpatient follow-up and return precautions given.    New Prescriptions   PROMETHAZINE (PHENERGAN) 50 MG TABLET    Take 1 tablet (50 mg total) by mouth every 6 (six) hours as needed for nausea.           Wynetta Emery, PA-C 07/14/12 1649

## 2012-07-14 NOTE — ED Notes (Addendum)
Pt given 2 percocet and water.  Pt shivering and sweating

## 2012-07-15 LAB — URINE CULTURE
Colony Count: NO GROWTH
Culture: NO GROWTH

## 2012-11-11 ENCOUNTER — Observation Stay (HOSPITAL_COMMUNITY)
Admission: EM | Admit: 2012-11-11 | Discharge: 2012-11-12 | Disposition: A | Discharge status: Home / Self Care | Payer: MEDICAID | Attending: Internal Medicine | Admitting: Internal Medicine

## 2012-11-11 ENCOUNTER — Emergency Department (HOSPITAL_COMMUNITY): Payer: Self-pay

## 2012-11-11 ENCOUNTER — Encounter (HOSPITAL_COMMUNITY): Payer: Self-pay | Admitting: Emergency Medicine

## 2012-11-11 DIAGNOSIS — R197 Diarrhea, unspecified: Secondary | ICD-10-CM

## 2012-11-11 DIAGNOSIS — R918 Other nonspecific abnormal finding of lung field: Secondary | ICD-10-CM

## 2012-11-11 DIAGNOSIS — R1013 Epigastric pain: Secondary | ICD-10-CM | POA: Insufficient documentation

## 2012-11-11 DIAGNOSIS — J189 Pneumonia, unspecified organism: Secondary | ICD-10-CM

## 2012-11-11 DIAGNOSIS — D72829 Elevated white blood cell count, unspecified: Secondary | ICD-10-CM

## 2012-11-11 DIAGNOSIS — F17200 Nicotine dependence, unspecified, uncomplicated: Secondary | ICD-10-CM | POA: Insufficient documentation

## 2012-11-11 DIAGNOSIS — F1210 Cannabis abuse, uncomplicated: Secondary | ICD-10-CM | POA: Insufficient documentation

## 2012-11-11 DIAGNOSIS — R112 Nausea with vomiting, unspecified: Principal | ICD-10-CM

## 2012-11-11 HISTORY — DX: Depression, unspecified: F32.A

## 2012-11-11 HISTORY — DX: Low back pain, unspecified: M54.50

## 2012-11-11 HISTORY — DX: Anxiety disorder, unspecified: F41.9

## 2012-11-11 HISTORY — DX: Low back pain: M54.5

## 2012-11-11 HISTORY — DX: Other chronic pain: G89.29

## 2012-11-11 HISTORY — DX: Major depressive disorder, single episode, unspecified: F32.9

## 2012-11-11 LAB — URINALYSIS, ROUTINE W REFLEX MICROSCOPIC
Nitrite: NEGATIVE
Protein, ur: 30 mg/dL — AB
Specific Gravity, Urine: 1.017 (ref 1.005–1.030)
Urobilinogen, UA: 0.2 mg/dL (ref 0.0–1.0)

## 2012-11-11 LAB — COMPREHENSIVE METABOLIC PANEL
Alkaline Phosphatase: 91 U/L (ref 39–117)
BUN: 13 mg/dL (ref 6–23)
GFR calc Af Amer: >90 mL/min (ref >90)
GFR calc non Af Amer: >90 mL/min (ref >90)
Glucose, Bld: 139 mg/dL — ABNORMAL HIGH (ref 70–99)
Potassium: 3.5 mEq/L (ref 3.5–5.1)
Total Protein: 7.9 g/dL (ref 6.0–8.3)

## 2012-11-11 LAB — CBC WITH DIFFERENTIAL/PLATELET
Basophils Absolute: 0 10*3/uL (ref 0.0–0.1)
Basophils Relative: 0 % (ref 0–1)
Hemoglobin: 14.8 g/dL (ref 13.0–17.0)
MCHC: 33.9 g/dL (ref 30.0–36.0)
Neutro Abs: 15.4 10*3/uL — ABNORMAL HIGH (ref 1.7–7.7)
Neutrophils Relative %: 95 % — ABNORMAL HIGH (ref 43–77)
Platelets: 197 10*3/uL (ref 150–400)
RDW: 12.8 % (ref 11.5–15.5)

## 2012-11-11 LAB — RAPID URINE DRUG SCREEN, HOSP PERFORMED
Amphetamines: NOT DETECTED
Barbiturates: NOT DETECTED
Opiates: POSITIVE — AB
Tetrahydrocannabinol: POSITIVE — AB

## 2012-11-11 LAB — LIPASE, BLOOD: Lipase: 24 U/L (ref 11–59)

## 2012-11-11 LAB — URINE MICROSCOPIC-ADD ON

## 2012-11-11 MED ORDER — AZITHROMYCIN 500 MG PO TABS
500.0000 mg | ORAL_TABLET | Freq: Every day | ORAL | Status: DC
  Administered 2012-11-11 – 2012-11-12 (×2): 500 mg via ORAL
  Filled 2012-11-11 (×2): qty 1

## 2012-11-11 MED ORDER — ONDANSETRON HCL 4 MG/2ML IJ SOLN
4.0000 mg | Freq: Once | INTRAMUSCULAR | Status: AC
  Administered 2012-11-11: 4 mg via INTRAVENOUS
  Filled 2012-11-11: qty 2

## 2012-11-11 MED ORDER — IOHEXOL 300 MG/ML  SOLN
25.0000 mL | INTRAMUSCULAR | Status: AC
  Administered 2012-11-11: 25 mL via ORAL

## 2012-11-11 MED ORDER — IOHEXOL 300 MG/ML  SOLN
100.0000 mL | Freq: Once | INTRAMUSCULAR | Status: AC | PRN
  Administered 2012-11-11: 75 mL via INTRAVENOUS

## 2012-11-11 MED ORDER — HYDROMORPHONE HCL PF 1 MG/ML IJ SOLN
0.5000 mg | Freq: Once | INTRAMUSCULAR | Status: AC
  Administered 2012-11-11: 0.5 mg via INTRAVENOUS
  Filled 2012-11-11: qty 1

## 2012-11-11 MED ORDER — MORPHINE SULFATE 4 MG/ML IJ SOLN
4.0000 mg | Freq: Once | INTRAMUSCULAR | Status: AC
  Administered 2012-11-11: 4 mg via INTRAVENOUS
  Filled 2012-11-11: qty 1

## 2012-11-11 MED ORDER — ONDANSETRON HCL 4 MG/2ML IJ SOLN
4.0000 mg | Freq: Four times a day (QID) | INTRAMUSCULAR | Status: DC | PRN
  Administered 2012-11-11 – 2012-11-12 (×2): 4 mg via INTRAVENOUS
  Filled 2012-11-11 (×2): qty 2

## 2012-11-11 MED ORDER — ONDANSETRON HCL 4 MG PO TABS: 4.0000 mg | ORAL_TABLET | Freq: Four times a day (QID) | ORAL | Status: DC | PRN

## 2012-11-11 MED ORDER — ACETAMINOPHEN 650 MG RE SUPP: 650.0000 mg | Freq: Four times a day (QID) | RECTAL | Status: DC | PRN

## 2012-11-11 MED ORDER — CEFTRIAXONE SODIUM 1 G IJ SOLR
1.0000 g | INTRAMUSCULAR | Status: DC
  Administered 2012-11-11: 1 g via INTRAVENOUS
  Filled 2012-11-11 (×2): qty 10

## 2012-11-11 MED ORDER — METOCLOPRAMIDE HCL 5 MG/ML IJ SOLN
10.0000 mg | Freq: Once | INTRAMUSCULAR | Status: AC
  Administered 2012-11-11: 10 mg via INTRAVENOUS
  Filled 2012-11-11: qty 2

## 2012-11-11 MED ORDER — ACETAMINOPHEN 325 MG PO TABS: 650.0000 mg | ORAL_TABLET | Freq: Four times a day (QID) | ORAL | Status: DC | PRN

## 2012-11-11 MED ORDER — SODIUM CHLORIDE 0.9 % IV BOLUS (SEPSIS)
2000.0000 mL | Freq: Once | INTRAVENOUS | Status: AC
  Administered 2012-11-11: 2000 mL via INTRAVENOUS

## 2012-11-11 MED ORDER — HYDROCODONE-ACETAMINOPHEN 5-325 MG PO TABS
1.0000 | ORAL_TABLET | ORAL | Status: DC | PRN
  Administered 2012-11-11 (×2): 2 via ORAL
  Administered 2012-11-12: 1 via ORAL
  Filled 2012-11-11 (×2): qty 2
  Filled 2012-11-11: qty 1

## 2012-11-11 MED ORDER — SODIUM CHLORIDE 0.9 % IV SOLN
INTRAVENOUS | Status: DC
  Administered 2012-11-11 – 2012-11-12 (×2): via INTRAVENOUS
  Filled 2012-11-11 (×4): qty 1000

## 2012-11-11 MED ORDER — ENOXAPARIN SODIUM 40 MG/0.4ML ~~LOC~~ SOLN
40.0000 mg | SUBCUTANEOUS | Status: DC
  Administered 2012-11-11: 40 mg via SUBCUTANEOUS
  Filled 2012-11-11 (×2): qty 0.4

## 2012-11-11 NOTE — ED Notes (Signed)
To ED via private vehicle from home with c/o nausea/vomiting/diarrhea and severe abd cramping started Friday evening. Dry heaves on arrival , writhing in bed from abd cramps

## 2012-11-11 NOTE — ED Notes (Signed)
Has been staying at sister's house-- not drinking ETOH,

## 2012-11-11 NOTE — ED Notes (Addendum)
Sister-- [phone]-- Daniel [name]--- Please call with update. Sister took wallet home

## 2012-11-11 NOTE — ED Provider Notes (Signed)
CSN: [id number]     Arrival date & time 11/11/12  6578 History     First MD Initiated Contact with Patient 11/11/12 [phone]     No chief complaint on file.  (Consider location/radiation/quality/duration/timing/severity/associated sxs/prior Treatment) HPI Complains of epigastric pain accompanied by multiple episodes of vomiting diarrhea onset 4 days ago.. States emesis may have had slight amount of blood. No blood per rectum he also admits to subjective fevers. Nothing makes symptoms better or worse. Pain is crampy and severe. No blood per rectum. He himself with Pepto-Bismol, without relief. Past Medical History  Diagnosis Date  . Rectal abscess   . Internal hemorrhoids with other complication    Past Surgical History  Procedure Laterality Date  . Incision and drainage perirectal abscess    . Hernia repair     No family history on file. History  Substance Use Topics  . Smoking status: Current Every Day Smoker -- 0.50 packs/day    Types: Cigarettes  . Smokeless tobacco: Not on file  . Alcohol Use: Yes     Comment: occasional   positive marijuana use and occasional alcohol use  Review of Systems  Constitutional: Positive for fever and chills.  HENT: Negative.   Respiratory: Negative.   Cardiovascular: Negative.   Gastrointestinal: Positive for nausea, vomiting, abdominal pain and diarrhea.  Musculoskeletal: Negative.   Skin: Negative.   Neurological: Negative.   Psychiatric/Behavioral: Negative.   All other systems reviewed and are negative.    Allergies  Review of patient's allergies indicates no known allergies.  Home Medications   Current Outpatient Rx  Name  Route  Sig  Dispense  Refill  . omeprazole (PRILOSEC) 20 MG capsule   Oral   Take 40 mg by mouth daily.         . ondansetron (ZOFRAN) 4 MG tablet   Oral   Take 4 mg by mouth every 6 (six) hours as needed for nausea.         . promethazine (PHENERGAN) 50 MG tablet   Oral   Take 1 tablet (50 mg  total) by mouth every 6 (six) hours as needed for nausea.   30 tablet   0    BP 146/80  Pulse 62  Temp(Src) 98.2 F (36.8 C) (Oral)  Resp 18  SpO2 98% Physical Exam  Nursing note and vitals reviewed. Constitutional: He appears well-developed and well-nourished.  HENT:  Head: Normocephalic and atraumatic.  Eyes: Conjunctivae are normal. Pupils are equal, round, and reactive to light.  Neck: Neck supple. No tracheal deviation present. No thyromegaly present.  Cardiovascular: Normal rate and regular rhythm.   No murmur heard. Pulmonary/Chest: Effort normal and breath sounds normal.  Abdominal: Soft. Bowel sounds are normal. He exhibits no distension and no mass. There is tenderness. There is no rebound and no guarding.  Diffusely tender  Genitourinary: Penis normal.  Normal scrotum  Musculoskeletal: Normal range of motion. He exhibits no edema and no tenderness.  Neurological: He is alert. Coordination normal.  Skin: Skin is warm and dry. No rash noted.  Psychiatric: He has a normal mood and affect.    ED Course   Procedures (including critical care time)  Labs Reviewed - No data to display No results found. No diagnosis found. 10 AM feels improved after treatment with intravenous fluids, opioids and antiemetics. 4:15 PM patient comfortable. Asking for something to drink. CT scan discuss with radiologist. Multiple nodules, possibly infectious and other possibility includes multifocal adenocarcinoma. Patient also has a 9  mm nodule at the right middle lobe which has increased in size since 2012 MDM  Spoke with Dr.Tat plan 23 hour observation med surge floor. Urine sent for culture Diagnoses #1 abdominal pain #2 nausea and vomiting diarrhea #3 lung nodules #4 tobacco abuse  Doug Sou, MD 11/11/12 [phone]

## 2012-11-11 NOTE — ED Notes (Signed)
Speaking with sister on phone, will call back later.

## 2012-11-11 NOTE — H&P (Signed)
[facility] History and Physical  Daniel [name]:[id number] DOB: [birth year] DOA: 11/11/2012   PCP: No PCP Per Patient   Chief Complaint: Intractable nausea, vomiting, diarrhea  HPI:  47 year old male with history of rectal abscess without any other major chronic medical problems presents with four-day history of nausea, vomiting. He started having loose stools today. He had 4 loose bowel movements today. Since coming to the emergency department, the patient has not had any emesis nor any bowel movements. He believes he may have seen some blood in one of his episodes of emesis. He denies any hematochezia or melena. He has not had any recent travels. He denies eating any raw or undercooked foods. He has not had any sick contacts. The patient denies any fevers, chills, chest pain, shortness breath, coughing, hemoptysis. The patient denies any recent antibiotics. He denies any dysuria, hematuria. The patient had some dizziness which has improved with IV fluids in the ED. The patient states that he has been smoking some marijuana, but denies any other illegal drug use. He denies any alcohol. He smokes 15 cigarettes per day for the past 20 years. He takes no chronic medications. He denies taking any NSAIDs. In ED, the patient was given 2 L of normal saline and morphine as well as hydromorphone. CT of the abdomen and pelvis did not reveal any acute intra-abdominal processes. CT of the chest did reveal several groundglass nodules scattered in the bilateral lungs as well as a groundglass opacity in the right middle lobe with a central lucency was concerning for possible malignancy. WBC was noted to be 16.3. BMP was unremarkable. Hepatic enzymes were negative. Lipase was 24. Urinalysis showed 11-20 WBCs. The patient was hemodynamically stable. Assessment/Plan: Community acquired pneumonia/pulmonary nodules -Will treat these opacities as an infectious process/CAP -The patient will need close followup and  surveillance CT versus PET scan in the future -Blood cultures x2 sets -Urine Legionella antigen -Urine Streptococcus pneumonia antigen -Start the patient on ceftriaxone and azithromycin -HIV test Intractable nausea/vomiting/diarrhea -Upon my examination of the patient, the patient was extremely comfortable, nontoxi--he wanted a regular diet -Urine drug screen -Cdiff PCR -GI Stool pathogen pane -When necessary antiemetic -IV fluids Abdominal pain with pyuria -Followup urine culture -CT abdomen/pelvis negative -Workup as discussed above Tobacco abuse -Tobacco cessation discussed         Past Medical History  Diagnosis Date  . Rectal abscess   . Internal hemorrhoids with other complication    Past Surgical History  Procedure Laterality Date  . Incision and drainage perirectal abscess    . Hernia repair     Social History:  reports that he has been smoking Cigarettes.  He has been smoking about 0.50 packs per day. He does not have any smokeless tobacco history on file. He reports that  drinks alcohol. He reports that he uses illicit drugs (Marijuana).   Family history reviewed: No pertinent family history  No Known Allergies    Prior to Admission medications   Not on File    Review of Systems:  Constitutional:  No weight loss, night sweats, Fevers,  fatigue.  Head&Eyes: No headache.  No vision loss.  No eye pain or scotoma ENT:  No Difficulty swallowing,Tooth/dental problems,Sore throat,   Cardio-vascular:  No chest pain, Orthopnea, PND, swelling in lower extremities,   palpitations  GI:  Complains of nausea, vomiting, diarrhea. No hematochezia or melena. Resp:  No shortness of breath with exertion or at rest. No cough. No coughing up of blood .  No wheezing.No chest wall deformity  Skin:  no rash or lesions.  GU:  no dysuria, change in color of urine, no urgency or frequency. No flank pain.  Musculoskeletal:  No joint pain or swelling. No decreased  range of motion. No back pain.  Psych:  No change in mood or affect. No depression or anxiety. Neurologic: No headache, no dysesthesia, no focal weakness, no vision loss. No syncope  Physical Exam: Filed Vitals:   11/11/12 1415 11/11/12 1430 11/11/12 1445 11/11/12 1500  BP: 156/84 129/64 126/74 116/63  Pulse: 56 76 74 70  Temp:      TempSrc:      Resp:      SpO2: 100% 100% 100% 100%   General:  A&O x 3, NAD, nontoxic, pleasant/cooperative Head/Eye: No conjunctival hemorrhage, no icterus, [place]/AT, No nystagmus ENT:  No icterus,  No thrush, poor dentition, no pharyngeal exudate Neck:  No masses, no lymphadenpathy, no bruits CV:  RRR, no rub, no gallop, no S3 Lung:  Bibasilar crackles. Left clear to auscultation. No wheezes. Abdomen: soft/NT, +BS, nondistended, no peritoneal signs Ext: No cyanosis, No rashes, No petechiae, No lymphangitis, No edema Neuro: CNII-XII intact, strength 4/5 in bilateral upper and lower extremities, no dysmetria  Labs on Admission:  Basic Metabolic Panel:  Recent Labs Lab 11/11/12 0931  NA 141  K 3.5  CL 102  CO2 26  GLUCOSE 139*  BUN 13  CREATININE 0.84  CALCIUM 9.7   Liver Function Tests:  Recent Labs Lab 11/11/12 0931  AST 15  ALT 13  ALKPHOS 91  BILITOT 0.6  PROT 7.9  ALBUMIN 4.1    Recent Labs Lab 11/11/12 0931  LIPASE 24   No results found for this basename: AMMONIA,  in the last 168 hours CBC:  Recent Labs Lab 11/11/12 0931  WBC 16.3*  NEUTROABS 15.4*  HGB 14.8  HCT 43.7  MCV 92.0  PLT 197   Cardiac Enzymes: No results found for this basename: CKTOTAL, CKMB, CKMBINDEX, TROPONINI,  in the last 168 hours BNP: No components found with this basename: POCBNP,  CBG: No results found for this basename: GLUCAP,  in the last 168 hours  Radiological Exams on Admission: Ct Chest Wo Contrast  11/11/2012   *RADIOLOGY REPORT*  Clinical Data: Nodule seen on CT abdomen.  CT CHEST WITHOUT CONTRAST  Technique:  Multidetector  CT imaging of the chest was performed following the standard protocol without IV contrast.  Comparison: Same date abdomen CT 11/11/2012 as well as abdomen CT 10/28/2010.  Findings: There is no supraclavicular lymphadenopathy seen. Dependent opacity within the trachea (series 2, image 14) is likely a result of secretions.  There is normal contour and caliber of the thoracic aorta.  The main pulmonary artery is of normal caliber. Heart size is normal.  There is no pericardial effusion.  There is no mediastinal or hilar lymphadenopathy seen.  There is no axillary lymphadenopathy.  There is paraseptal emphysema predominately within the upper lobes with bulla in the left lung apex.  Irregular somewhat nodular opacities within the lung apices bilaterally may be related to scarring, with a somewhat ground glass appearing opacity in the right lung apex (series 2, image 12) measuring 1.1 cm.  Additional scattered ground-glass opacities are present, with a ground glass opacity in the left upper lobe (series 2, image 28) measuring 1 cm. A ground glass opacity with central lucency in the right middle lobe (series 2, image 44) measures 0.9 cm, previously 0.7 cm.  No  pneumothorax seen.  No pleural effusions.  Limited noncontrast evaluation through the upper abdomen.  Contrast within the collecting systems is seen compatible with recent IV contrast administration.  There are no acute or aggressive appearing osseous lesions seen.  There is a metallic foreign body within the soft tissues of the left shoulder.  IMPRESSION:   Slight interval enlargement of a ground glass opacity containing a central lucency within the right middle lobe when compared to prior exam from 2012, concerning for a slow growing primary lung malignancy. In addition, several additional ground-glass pulmonary nodules are present which could be infectious or inflammatory, although malignancy not excluded.  Consider short-term interval (3 month) follow-up chest CT  for these additional ground-glass nodules.  Findings were discussed with Dr. Ethelda Chick 4:00 p.m. 11/11/2012.   Original Report Authenticated By: Edwin Cap, M.D.   Ct Abdomen Pelvis W Contrast  11/11/2012   *RADIOLOGY REPORT*  Clinical Data: Periumbilical pain with cramping and vomiting for 5 days.  CT ABDOMEN AND PELVIS WITH CONTRAST  Technique:  Multidetector CT imaging of the abdomen and pelvis was performed following the standard protocol during bolus administration of intravenous contrast.  Contrast: 75mL OMNIPAQUE IOHEXOL 300 MG/ML  SOLN  Comparison: Abdominal pelvic CTs dated 10/28/2010 and 12/19/2009.  Findings: There is motion on the images through the lung bases. There is a focal ground-glass density in the right middle lobe on image #1 which is poorly evaluated due to the motion.  This does appear larger than on the prior studies.  The lung bases are otherwise clear.  There is no pleural or pericardial effusion.  Multiple tiny hepatic cysts are stable.  There is an area of altered density anteriorly in the left hepatic lobe on images 11 - 13 which is unchanged.  This may reflect focal fat or a hemangioma. The spleen, gallbladder, biliary system and pancreas appear normal.  There is no adrenal mass.  The left kidney appears normal.  There is a nonobstructing calculus in the lower pole of the right kidney which measures up to 10 mm on the coronal images.  There is no hydronephrosis or delay in contrast excretion.  There is a paucity of intra-abdominal fat.  No focal inflammatory changes or fluid collections are identified.  The appendix is air- filled and normal in caliber, best seen on axial image 61.  The bladder and prostate gland appear unremarkable.  No recurrent perirectal fluid collection is identified.  The osseous structures appear unremarkable.  IMPRESSION:  1.  No specific explanation for the patient's current symptoms is demonstrated.  The appendix appears normal. 2.  Nonobstructing  calculus lower pole of the right kidney. 3.  Suspected enlargement of a focal ground-glass nodule in the right middle lobe, suboptimally evaluated due to motion. Based on the suspected enlargement, this is worrisome enough to require further evaluation at this time to exclude the possibility of adenocarcinoma.  Full chest CT is recommended.  That does not require additional intravenous contrast.   Original Report Authenticated By: Carey Bullocks, M.D.        Time spent:60 minutes Code Status:   full Family Communication:   No Family at bedside   Dailan Pfalzgraf, DO  [facility] Pager [phone]  If 7PM-7AM, please contact night-coverage [email or web address] Password [facility] 11/11/2012, 5:06 PM

## 2012-11-12 LAB — BASIC METABOLIC PANEL
CO2: 27 mEq/L (ref 19–32)
Chloride: 105 mEq/L (ref 96–112)
GFR calc non Af Amer: >90 mL/min (ref >90)
Glucose, Bld: 94 mg/dL (ref 70–99)
Potassium: 3.7 mEq/L (ref 3.5–5.1)
Sodium: 138 mEq/L (ref 135–145)

## 2012-11-12 LAB — URINE CULTURE
Colony Count: NO GROWTH
Culture: NO GROWTH

## 2012-11-12 LAB — CBC
HCT: 40.2 % (ref 39.0–52.0)
Hemoglobin: 13.7 g/dL (ref 13.0–17.0)
MCH: 31.9 pg (ref 26.0–34.0)
RBC: 4.3 MIL/uL (ref 4.22–5.81)

## 2012-11-12 MED ORDER — AZITHROMYCIN 500 MG PO TABS: 500.0000 mg | ORAL_TABLET | Freq: Every day | ORAL | Status: AC

## 2012-11-12 MED ORDER — ALBUTEROL SULFATE HFA 108 (90 BASE) MCG/ACT IN AERS: 2.0000 | INHALATION_SPRAY | Freq: Four times a day (QID) | RESPIRATORY_TRACT | Status: DC | PRN

## 2012-11-12 MED ORDER — PROMETHAZINE HCL 12.5 MG PO TABS: 12.5000 mg | ORAL_TABLET | Freq: Three times a day (TID) | ORAL | Status: DC | PRN

## 2012-11-12 NOTE — Discharge Summary (Signed)
Physician Discharge Summary  Quade Ramirez Farney ZOX:[id number] DOB: 01-26-1966 DOA: 11/11/2012  PCP: No PCP Per Patient  Admit date: 11/11/2012 Discharge date: 11/12/2012  Time spent: 25 minutes  Recommendations for Outpatient Follow-up:  1. Patient getting referral to [facility]. 2. Patient will have a followup CT scan done without contrast in 3 months to evaluate further his lung nodule  Discharge Diagnoses:  Active Problems:   Lung nodules   CAP (community acquired pneumonia)   Leukocytosis   Diarrhea   Discharge Condition: Improved, being discharged home  Diet recommendation: Regular  Filed Weights   11/12/12 1155  Weight: 68.04 kg (150 lb)    History of present illness:  47 year old male with history of rectal abscess without any other major chronic medical problems presents with four-day history of nausea, vomiting. He started having loose stools today. He had 4 loose bowel movements today. Since coming to the emergency department, the patient has not had any emesis nor any bowel movements.  The patient states that he has been smoking some marijuana, but denies any other illegal drug use.  He smokes 15 cigarettes per day for the past 20 years. He takes no chronic medications. He denies taking any NSAIDs. In ED, the patient was given 2 L of normal saline and morphine as well as hydromorphone. CT of the abdomen and pelvis did not reveal any acute intra-abdominal processes. CT of the chest did reveal several groundglass nodules scattered in the bilateral lungs as well as a groundglass opacity in the right middle lobe with a central lucency was concerning for possible malignancy. WBC was noted to be 16.3.   Hospital Course:  Active Problems:   Lung nodules: CT scan noted enlarged nodule compared to x-ray 3 years ago. If possibly malignant, very slow growing. Recommended CT scan of chest without contrast in 3 months. We'll schedule as outpatient. Was tablet  patient with [facility] to have CT followed up on.    CAP (community acquired pneumonia): Vague findings. Patient not having any respiratory complaints. Given 2 days of IV antibiotics and will get 3 more days of Zithromax by mouth.    Leukocytosis: Gastroenteritis and/or pneumonia. Resolved by the second day.   Nausea and vomiting and Diarrhea: Suspected gastroenteritis. Result. Patient tolerating by mouth.  THC abuse: May because of nausea and vomiting. Counseled.  Tobacco abuse: Patient counseled.  Procedures:  None  Consultations:  None  Discharge Exam: Filed Vitals:   11/12/12 0536 11/12/12 1030 11/12/12 1155 11/12/12 1300  BP: 115/72 138/83  118/72  Pulse: 65 78  76  Temp: 98.6 F (37 C) 98.3 F (36.8 C)  98.2 F (36.8 C)  TempSrc: Oral Oral  Oral  Resp: 18 18  18   Height:   5\' 7"  (1.702 m)   Weight:   68.04 kg (150 lb)   SpO2: 100% 100%  100%    General: Alert and oriented x3 Cardiovascular: Regular rate and rhythm, S1-S2 Respiratory: Clear to auscultation bilaterally Abdomen: Soft, nontender, nondistended, positive bowel sounds Extremities: No clubbing or cyanosis or edema  Discharge Instructions  Discharge Orders   Future Orders Complete By Expires     Diet general  As directed     Increase activity slowly  As directed         Medication List         albuterol 108 (90 BASE) MCG/ACT inhaler  Commonly known as:  PROVENTIL HFA;VENTOLIN HFA  Inhale 2 puffs into the lungs  every 6 (six) hours as needed for wheezing.     azithromycin 500 MG tablet  Commonly known as:  ZITHROMAX  Take 1 tablet (500 mg total) by mouth daily.  Start taking on:  11/13/2012     promethazine 12.5 MG tablet  Commonly known as:  PHENERGAN  Take 1 tablet (12.5 mg total) by mouth every 8 (eight) hours as needed for nausea.       No Known Allergies     Follow-up Information   Follow up with [facility] In 3 months. (CT scan of Chest in 3  months to follow up lung nodule)    Contact information:   [phone]       The results of significant diagnostics from this hospitalization (including imaging, microbiology, ancillary and laboratory) are listed below for reference.    Significant Diagnostic Studies: Ct Chest Wo Contrast  11/11/2012   IMPRESSION:   Slight interval enlargement of a ground glass opacity containing a central lucency within the right middle lobe when compared to prior exam from 2012, concerning for a slow growing primary lung malignancy. In addition, several additional ground-glass pulmonary nodules are present which could be infectious or inflammatory, although malignancy not excluded.  Consider short-term interval (3 month) follow-up chest CT for these additional ground-glass nodules.  Findings were discussed with Dr. Ethelda Chick 4:00 p.m. 11/11/2012.   Original Report Authenticated By: Edwin Cap, M.D.   Ct Abdomen Pelvis W Contrast  11/11/2012     IMPRESSION:  1.  No specific explanation for the patient's current symptoms is demonstrated.  The appendix appears normal. 2.  Nonobstructing calculus lower pole of the right kidney. 3.  Suspected enlargement of a focal ground-glass nodule in the right middle lobe, suboptimally evaluated due to motion. Based on the suspected enlargement, this is worrisome enough to require further evaluation at this time to exclude the possibility of adenocarcinoma.  Full chest CT is recommended.  That does not require additional intravenous contrast.   Original Report Authenticated By: Carey Bullocks, M.D.     Labs: Basic Metabolic Panel:  Recent Labs Lab 11/11/12 0931 11/12/12 0500  NA 141 138  K 3.5 3.7  CL 102 105  CO2 26 27  GLUCOSE 139* 94  BUN 13 8  CREATININE 0.84 0.95  CALCIUM 9.7 9.0   Liver Function Tests:  Recent Labs Lab 11/11/12 0931  AST 15  ALT 13  ALKPHOS 91  BILITOT 0.6  PROT 7.9  ALBUMIN 4.1    Recent Labs Lab 11/11/12 0931  LIPASE  24   CBC:  Recent Labs Lab 11/11/12 0931 11/12/12 0500  WBC 16.3* 9.7  NEUTROABS 15.4*  --   HGB 14.8 13.7  HCT 43.7 40.2  MCV 92.0 93.5  PLT 197 187     Signed:  Kemani Demarais K  [facility] 11/12/2012, 5:43 PM

## 2012-11-12 NOTE — Progress Notes (Signed)
Patient discharged to home with instructions. 

## 2012-11-12 NOTE — Progress Notes (Signed)
MATCH letter given and explained to patient for medication assistance . Patient voiced understanding .   Unable to reach [facility] and [facility] by phone . Emailed Maryan Char requesting follow up appointment , with patient's contact information . Patient aware.   Gave patient Contact information for [employer] and [employer] .

## 2012-11-18 LAB — CULTURE, BLOOD (ROUTINE X 2)
Culture: NO GROWTH
Culture: NO GROWTH

## 2012-11-20 ENCOUNTER — Encounter (HOSPITAL_COMMUNITY): Payer: Self-pay | Admitting: *Deleted

## 2012-11-20 ENCOUNTER — Emergency Department (HOSPITAL_COMMUNITY)
Admission: EM | Admit: 2012-11-20 | Discharge: 2012-11-20 | Disposition: A | Discharge status: Home / Self Care | Payer: Self-pay | Attending: Emergency Medicine | Admitting: Emergency Medicine

## 2012-11-20 DIAGNOSIS — Z8679 Personal history of other diseases of the circulatory system: Secondary | ICD-10-CM | POA: Insufficient documentation

## 2012-11-20 DIAGNOSIS — F17200 Nicotine dependence, unspecified, uncomplicated: Secondary | ICD-10-CM | POA: Insufficient documentation

## 2012-11-20 DIAGNOSIS — R109 Unspecified abdominal pain: Secondary | ICD-10-CM | POA: Insufficient documentation

## 2012-11-20 DIAGNOSIS — R1110 Vomiting, unspecified: Secondary | ICD-10-CM | POA: Insufficient documentation

## 2012-11-20 DIAGNOSIS — G8929 Other chronic pain: Secondary | ICD-10-CM | POA: Insufficient documentation

## 2012-11-20 DIAGNOSIS — Z8719 Personal history of other diseases of the digestive system: Secondary | ICD-10-CM | POA: Insufficient documentation

## 2012-11-20 DIAGNOSIS — R197 Diarrhea, unspecified: Secondary | ICD-10-CM | POA: Insufficient documentation

## 2012-11-20 DIAGNOSIS — Z8659 Personal history of other mental and behavioral disorders: Secondary | ICD-10-CM | POA: Insufficient documentation

## 2012-11-20 LAB — URINALYSIS, ROUTINE W REFLEX MICROSCOPIC
Glucose, UA: NEGATIVE mg/dL
Leukocytes, UA: NEGATIVE
Specific Gravity, Urine: 1.013 (ref 1.005–1.030)
pH: 8 (ref 5.0–8.0)

## 2012-11-20 LAB — COMPREHENSIVE METABOLIC PANEL
AST: 22 U/L (ref 0–37)
Albumin: 4.4 g/dL (ref 3.5–5.2)
Calcium: 10 mg/dL (ref 8.4–10.5)
Creatinine, Ser: 0.82 mg/dL (ref 0.50–1.35)
Sodium: 142 mEq/L (ref 135–145)

## 2012-11-20 LAB — CBC WITH DIFFERENTIAL/PLATELET
Basophils Absolute: 0 10*3/uL (ref 0.0–0.1)
Basophils Relative: 0 % (ref 0–1)
Eosinophils Relative: 0 % (ref 0–5)
HCT: 44.5 % (ref 39.0–52.0)
MCHC: 35.5 g/dL (ref 30.0–36.0)
Monocytes Absolute: 0.3 10*3/uL (ref 0.1–1.0)
Neutro Abs: 15.2 10*3/uL — ABNORMAL HIGH (ref 1.7–7.7)
Platelets: 212 10*3/uL (ref 150–400)
RDW: 12.9 % (ref 11.5–15.5)

## 2012-11-20 MED ORDER — METOCLOPRAMIDE HCL 5 MG/ML IJ SOLN
10.0000 mg | Freq: Once | INTRAMUSCULAR | Status: AC
  Administered 2012-11-20: 10 mg via INTRAVENOUS
  Filled 2012-11-20: qty 2

## 2012-11-20 MED ORDER — LORAZEPAM 2 MG/ML IJ SOLN
1.0000 mg | Freq: Once | INTRAMUSCULAR | Status: AC
  Administered 2012-11-20: 1 mg via INTRAVENOUS
  Filled 2012-11-20: qty 1

## 2012-11-20 MED ORDER — SODIUM CHLORIDE 0.9 % IV BOLUS (SEPSIS)
2000.0000 mL | Freq: Once | INTRAVENOUS | Status: AC
  Administered 2012-11-20: 1000 mL via INTRAVENOUS

## 2012-11-20 MED ORDER — PROMETHAZINE HCL 25 MG PO TABS: 25.0000 mg | ORAL_TABLET | Freq: Four times a day (QID) | ORAL | Status: DC | PRN

## 2012-11-20 MED ORDER — HYDROMORPHONE HCL PF 1 MG/ML IJ SOLN
1.0000 mg | Freq: Once | INTRAMUSCULAR | Status: AC
  Administered 2012-11-20: 1 mg via INTRAVENOUS
  Filled 2012-11-20: qty 1

## 2012-11-20 MED ORDER — OXYCODONE-ACETAMINOPHEN 5-325 MG PO TABS: 1.0000 | ORAL_TABLET | Freq: Four times a day (QID) | ORAL | Status: DC | PRN

## 2012-11-20 NOTE — ED Notes (Signed)
Sister notified of discharge-- will be here to pick up in approx 45 minutes

## 2012-11-20 NOTE — ED Notes (Signed)
Pt denies further pain, sleeping without complaints-- will wait for sister in hallway of pod A in w/c

## 2012-11-20 NOTE — ED Notes (Signed)
Patient with c/o n/v and abdominal pain. Patient seen for same last week with improvement but pain started again this am

## 2012-11-20 NOTE — ED Notes (Signed)
Bednar, MD at bedside. 

## 2012-11-20 NOTE — ED Provider Notes (Signed)
Care assumed at the change of shift. Pt with diffuse abdominal pain, vomiting, similar to previous. Has leukocytosis similar to previous. Pain and vomiting improved. Tolerating PO fluids now and ready to go home. States he is working to get GI followup.   Jung Yurchak B. Bernette Mayers, MD 11/20/12 1000

## 2012-11-20 NOTE — ED Provider Notes (Signed)
CSN: [id number]     Arrival date & time 11/20/12  0614 History     First MD Initiated Contact with Patient 11/20/12 [phone]     Chief Complaint  Patient presents with  . Abdominal Pain  . Emesis   (Consider location/radiation/quality/duration/timing/severity/associated sxs/prior Treatment) HPI This 47 year old male has between 6-7 hours of sudden onset diffuse constant waxing and waning moderate to severe abdominal pain with multiple episodes of nonbloody vomiting and diarrhea today, he is no chest pain cough shortness breath fever confusion rash testicular pain or dysuria, he recently had a CT scan for perirectal abscess, he then presented with an episode of abdominal pain vomiting and diarrhea and a CT scan which showed incidental lung nodule, he then had CT scan of the chest confirming lung nodule and he will follow up with a CT scan of his chest to 3 months for that. There is no treatment prior to arrival. Past Medical History  Diagnosis Date  . Rectal abscess   . Internal hemorrhoids with other complication   . Chronic lower back pain   . Depression   . Anxiety    Past Surgical History  Procedure Laterality Date  . Incision and drainage perirectal abscess  10/2010  . Hernia repair     No family history on file. History  Substance Use Topics  . Smoking status: Current Every Day Smoker -- 0.75 packs/day for 28 years    Types: Cigarettes  . Smokeless tobacco: Not on file  . Alcohol Use: Yes     Comment: 11/11/2012 "probably 2, 40oz beers/month"    Review of Systems 10 Systems reviewed and are negative for acute change except as noted in the HPI. Allergies  Review of patient's allergies indicates no known allergies.  Home Medications   Current Outpatient Rx  Name  Route  Sig  Dispense  Refill  . albuterol (PROVENTIL HFA;VENTOLIN HFA) 108 (90 BASE) MCG/ACT inhaler   Inhalation   Inhale 2 puffs into the lungs every 6 (six) hours as needed for wheezing.   1 Inhaler   2   .  oxyCODONE-acetaminophen (PERCOCET/ROXICET) 5-325 MG per tablet   Oral   Take 1-2 tablets by mouth every 6 (six) hours as needed for pain.   20 tablet   0   . promethazine (PHENERGAN) 25 MG tablet   Oral   Take 1 tablet (25 mg total) by mouth every 6 (six) hours as needed for nausea.   10 tablet   0    BP 128/80  Pulse 66  Temp(Src) 98.8 F (37.1 C) (Oral)  Resp 16  Ht 5\' 8"  (1.727 m)  Wt 150 lb (68.04 kg)  BMI 22.81 kg/m2  SpO2 99% Physical Exam  Nursing note and vitals reviewed. Constitutional:  Awake, alert, nontoxic appearance.  HENT:  Head: Atraumatic.  Eyes: Right eye exhibits no discharge. Left eye exhibits no discharge.  Neck: Neck supple.  Cardiovascular: Normal rate and regular rhythm.   No murmur heard. Pulmonary/Chest: Effort normal and breath sounds normal. No respiratory distress. He has no wheezes. He has no rales. He exhibits no tenderness.  Abdominal: Soft. Bowel sounds are normal. He exhibits no distension and no mass. There is tenderness. There is no rebound and no guarding.  Mild diffuse tenderness  Genitourinary:  Testicles nontender, no palpable inguinal hernias  Musculoskeletal: He exhibits no tenderness.  Baseline ROM, no obvious new focal weakness.  Neurological: He is alert.  Mental status and motor strength appears baseline for patient and  situation.  Skin: No rash noted.  Psychiatric:  Appears anxious    ED Course  Patient / Family / Caregiver understand and agree with initial ED impression and plan with expectations set for ED visit. Procedures (including critical care time)  Labs Reviewed  CBC WITH DIFFERENTIAL - Abnormal; Notable for the following:    WBC 16.4 (*)    Neutrophils Relative % 92 (*)    Neutro Abs 15.2 (*)    Lymphocytes Relative 6 (*)    Monocytes Relative 2 (*)    All other components within normal limits  COMPREHENSIVE METABOLIC PANEL - Abnormal; Notable for the following:    Glucose, Bld 134 (*)    All other  components within normal limits  URINALYSIS, ROUTINE W REFLEX MICROSCOPIC - Abnormal; Notable for the following:    APPearance HAZY (*)    All other components within normal limits  LIPASE, BLOOD   No results found. 1. Abdominal pain     MDM  *Dispo pending; care endorsed to Dr. Bernette Mayers.  Hurman Horn, MD 11/20/12 2056

## 2012-11-21 NOTE — Progress Notes (Signed)
   CARE MANAGEMENT ED NOTE 11/21/2012  Patient:  Daniel [name],Daniel [name]   Account Number:  [id number]  Date Initiated:  11/21/2012  Documentation initiated by:  [facility]  Subjective/Objective Assessment:   presented to ED yesterday with c/o diffuse abdominal pain     Subjective/Objective Assessment Detail:     Action/Plan:   Action/Plan Detail:   Anticipated DC Date:       Status Recommendation to Physician:   Result of Recommendation:      DC Planning Services  CM consult  Medication Assistance    Choice offered to / List presented to:            Status of service:    ED Comments:   ED Comments Detail:  11/21/12-1148-J.Danaka Llera,RN,BSN [phone]      Received call from patient regarding medication assistance. States,"I came back to the emergency department yesterday and they increased the dose of my medications. But that letter expired 11/19/12." Patient confirmed that he had in fact utilized the [facility] letter he was discharged home with. Explained to patient that MATCH was only [name] once [name] rolling calendar year tool and that he would not be elligible for it again until next August. He verbalized understanding. No further needs identified.

## 2013-01-14 ENCOUNTER — Ambulatory Visit: Payer: Self-pay

## 2013-01-22 ENCOUNTER — Ambulatory Visit: Payer: Self-pay

## 2014-05-17 ENCOUNTER — Encounter (HOSPITAL_COMMUNITY): Payer: Self-pay | Admitting: Neurology

## 2014-05-17 ENCOUNTER — Emergency Department (HOSPITAL_COMMUNITY)
Admission: EM | Admit: 2014-05-17 | Discharge: 2014-05-17 | Disposition: A | Discharge status: Home / Self Care | Payer: Self-pay | Attending: Emergency Medicine | Admitting: Emergency Medicine

## 2014-05-17 DIAGNOSIS — Z8659 Personal history of other mental and behavioral disorders: Secondary | ICD-10-CM | POA: Insufficient documentation

## 2014-05-17 DIAGNOSIS — Z79899 Other long term (current) drug therapy: Secondary | ICD-10-CM | POA: Insufficient documentation

## 2014-05-17 DIAGNOSIS — R112 Nausea with vomiting, unspecified: Secondary | ICD-10-CM | POA: Insufficient documentation

## 2014-05-17 DIAGNOSIS — G8929 Other chronic pain: Secondary | ICD-10-CM | POA: Insufficient documentation

## 2014-05-17 DIAGNOSIS — R197 Diarrhea, unspecified: Secondary | ICD-10-CM | POA: Insufficient documentation

## 2014-05-17 DIAGNOSIS — Z8719 Personal history of other diseases of the digestive system: Secondary | ICD-10-CM | POA: Insufficient documentation

## 2014-05-17 DIAGNOSIS — R1084 Generalized abdominal pain: Secondary | ICD-10-CM | POA: Insufficient documentation

## 2014-05-17 DIAGNOSIS — R51 Headache: Secondary | ICD-10-CM | POA: Insufficient documentation

## 2014-05-17 DIAGNOSIS — Z720 Tobacco use: Secondary | ICD-10-CM | POA: Insufficient documentation

## 2014-05-17 LAB — URINE MICROSCOPIC-ADD ON

## 2014-05-17 LAB — URINALYSIS, ROUTINE W REFLEX MICROSCOPIC
Bilirubin Urine: NEGATIVE
Glucose, UA: NEGATIVE mg/dL
Ketones, ur: 40 mg/dL — AB
Nitrite: NEGATIVE
Protein, ur: 100 mg/dL — AB
SPECIFIC GRAVITY, URINE: 1.021 (ref 1.005–1.030)
UROBILINOGEN UA: 0.2 mg/dL (ref 0.0–1.0)
pH: 7.5 (ref 5.0–8.0)

## 2014-05-17 LAB — COMPREHENSIVE METABOLIC PANEL
ALBUMIN: 4.4 g/dL (ref 3.5–5.2)
ALK PHOS: 93 U/L (ref 39–117)
ALT: 21 U/L (ref 0–53)
AST: 28 U/L (ref 0–37)
Anion gap: 12 (ref 5–15)
BUN: 8 mg/dL (ref 6–23)
CO2: 24 mmol/L (ref 19–32)
CREATININE: 0.88 mg/dL (ref 0.50–1.35)
Calcium: 9.9 mg/dL (ref 8.4–10.5)
Chloride: 107 mmol/L (ref 96–112)
GFR calc Af Amer: >90 mL/min (ref >90)
Glucose, Bld: 124 mg/dL — ABNORMAL HIGH (ref 70–99)
POTASSIUM: 4.1 mmol/L (ref 3.5–5.1)
SODIUM: 143 mmol/L (ref 135–145)
TOTAL PROTEIN: 8.8 g/dL — AB (ref 6.0–8.3)
Total Bilirubin: 0.6 mg/dL (ref 0.3–1.2)

## 2014-05-17 LAB — CBC WITH DIFFERENTIAL/PLATELET
BASOS PCT: 0 % (ref 0–1)
Basophils Absolute: 0 10*3/uL (ref 0.0–0.1)
EOS ABS: 0 10*3/uL (ref 0.0–0.7)
EOS PCT: 0 % (ref 0–5)
HEMATOCRIT: 46 % (ref 39.0–52.0)
Hemoglobin: 15.3 g/dL (ref 13.0–17.0)
LYMPHS PCT: 7 % — AB (ref 12–46)
Lymphs Abs: 1.1 10*3/uL (ref 0.7–4.0)
MCH: 30.7 pg (ref 26.0–34.0)
MCHC: 33.3 g/dL (ref 30.0–36.0)
MCV: 92.4 fL (ref 78.0–100.0)
MONOS PCT: 1 % — AB (ref 3–12)
Monocytes Absolute: 0.2 10*3/uL (ref 0.1–1.0)
NEUTROS ABS: 13.3 10*3/uL — AB (ref 1.7–7.7)
Neutrophils Relative %: 92 % — ABNORMAL HIGH (ref 43–77)
PLATELETS: 264 10*3/uL (ref 150–400)
RBC: 4.98 MIL/uL (ref 4.22–5.81)
RDW: 13.3 % (ref 11.5–15.5)
WBC: 14.6 10*3/uL — AB (ref 4.0–10.5)

## 2014-05-17 LAB — LIPASE, BLOOD: LIPASE: 28 U/L (ref 11–59)

## 2014-05-17 MED ORDER — DICYCLOMINE HCL 20 MG PO TABS: 20.0000 mg | ORAL_TABLET | Freq: Two times a day (BID) | ORAL | Status: DC

## 2014-05-17 MED ORDER — ONDANSETRON HCL 4 MG/2ML IJ SOLN
4.0000 mg | INTRAMUSCULAR | Status: AC
  Administered 2014-05-17: 4 mg via INTRAVENOUS
  Filled 2014-05-17: qty 2

## 2014-05-17 MED ORDER — ONDANSETRON 4 MG PO TBDP
8.0000 mg | ORAL_TABLET | Freq: Once | ORAL | Status: AC
  Administered 2014-05-17: 8 mg via ORAL
  Filled 2014-05-17: qty 2

## 2014-05-17 MED ORDER — SODIUM CHLORIDE 0.9 % IV BOLUS (SEPSIS)
1000.0000 mL | Freq: Once | INTRAVENOUS | Status: AC
  Administered 2014-05-17: 1000 mL via INTRAVENOUS

## 2014-05-17 MED ORDER — PROMETHAZINE HCL 25 MG/ML IJ SOLN
25.0000 mg | Freq: Once | INTRAMUSCULAR | Status: AC
  Administered 2014-05-17: 25 mg via INTRAVENOUS
  Filled 2014-05-17 (×2): qty 1

## 2014-05-17 MED ORDER — ONDANSETRON 4 MG PO TBDP: 4.0000 mg | ORAL_TABLET | Freq: Three times a day (TID) | ORAL | Status: DC | PRN

## 2014-05-17 MED ORDER — MORPHINE SULFATE 4 MG/ML IJ SOLN
4.0000 mg | Freq: Once | INTRAMUSCULAR | Status: AC
  Administered 2014-05-17: 4 mg via INTRAVENOUS
  Filled 2014-05-17: qty 1

## 2014-05-17 NOTE — ED Notes (Signed)
Pt reports abdominal pain, n/v/d since 0400 today. Has headache as well. Pt is a x 4

## 2014-05-17 NOTE — Discharge Instructions (Signed)
Nausea and Vomiting Nausea is a sick feeling that often comes before throwing up (vomiting). Vomiting is a reflex where stomach contents come out of your mouth. Vomiting can cause severe loss of body fluids (dehydration). Children and elderly adults can become dehydrated quickly, especially if they also have diarrhea. Nausea and vomiting are symptoms of a condition or disease. It is important to find the cause of your symptoms. CAUSES   Direct irritation of the stomach lining. This irritation can result from increased acid production (gastroesophageal reflux disease), infection, food poisoning, taking certain medicines (such as nonsteroidal anti-inflammatory drugs), alcohol use, or tobacco use.  Signals from the brain.These signals could be caused by a headache, heat exposure, an inner ear disturbance, increased pressure in the brain from injury, infection, a tumor, or a concussion, pain, emotional stimulus, or metabolic problems.  An obstruction in the gastrointestinal tract (bowel obstruction).  Illnesses such as diabetes, hepatitis, gallbladder problems, appendicitis, kidney problems, cancer, sepsis, atypical symptoms of a heart attack, or eating disorders.  Medical treatments such as chemotherapy and radiation.  Receiving medicine that makes you sleep (general anesthetic) during surgery. DIAGNOSIS Your caregiver may ask for tests to be done if the problems do not improve after a few days. Tests may also be done if symptoms are severe or if the reason for the nausea and vomiting is not clear. Tests may include:  Urine tests.  Blood tests.  Stool tests.  Cultures (to look for evidence of infection).  X-rays or other imaging studies. Test results can help your caregiver make decisions about treatment or the need for additional tests. TREATMENT You need to stay well hydrated. Drink frequently but in small amounts.You may wish to drink water, sports drinks, clear broth, or eat frozen  ice pops or gelatin dessert to help stay hydrated.When you eat, eating slowly may help prevent nausea.There are also some antinausea medicines that may help prevent nausea. HOME CARE INSTRUCTIONS   Take all medicine as directed by your caregiver.  If you do not have an appetite, do not force yourself to eat. However, you must continue to drink fluids.  If you have an appetite, eat a normal diet unless your caregiver tells you differently.  Eat a variety of complex carbohydrates (rice, wheat, potatoes, bread), lean meats, yogurt, fruits, and vegetables.  Avoid high-fat foods because they are more difficult to digest.  Drink enough water and fluids to keep your urine clear or pale yellow.  If you are dehydrated, ask your caregiver for specific rehydration instructions. Signs of dehydration may include:  Severe thirst.  Dry lips and mouth.  Dizziness.  Dark urine.  Decreasing urine frequency and amount.  Confusion.  Rapid breathing or pulse. SEEK IMMEDIATE MEDICAL CARE IF:   You have blood or brown flecks (like coffee grounds) in your vomit.  You have black or bloody stools.  You have a severe headache or stiff neck.  You are confused.  You have severe abdominal pain.  You have chest pain or trouble breathing.  You do not urinate at least once every 8 hours.  You develop cold or clammy skin.  You continue to vomit for longer than 24 to 48 hours.  You have a fever. MAKE SURE YOU:   Understand these instructions.  Will watch your condition.  Will get help right away if you are not doing well or get worse. Document Released: 04/02/2005 Document Revised: 06/25/2011 Document Reviewed: 08/30/2010 ExitCare Patient Information 2015 [employer]. This information is not intended   to replace advice given to you by your health care provider. Make sure you discuss any questions you have with your health care provider. Diarrhea Diarrhea is frequent loose and watery  bowel movements. It can cause you to feel weak and dehydrated. Dehydration can cause you to become tired and thirsty, have a dry mouth, and have decreased urination that often is dark yellow. Diarrhea is a sign of another problem, most often an infection that will not last long. In most cases, diarrhea typically lasts 2-3 days. However, it can last longer if it is a sign of something more serious. It is important to treat your diarrhea as directed by your caregiver to lessen or prevent future episodes of diarrhea. CAUSES  Some common causes include:  Gastrointestinal infections caused by viruses, bacteria, or parasites.  Food poisoning or food allergies.  Certain medicines, such as antibiotics, chemotherapy, and laxatives.  Artificial sweeteners and fructose.  Digestive disorders. HOME CARE INSTRUCTIONS  Ensure adequate fluid intake (hydration): Have 1 cup (8 oz) of fluid for each diarrhea episode. Avoid fluids that contain simple sugars or sports drinks, fruit juices, whole milk products, and sodas. Your urine should be clear or pale yellow if you are drinking enough fluids. Hydrate with an oral rehydration solution that you can purchase at pharmacies, retail stores, and online. You can prepare an oral rehydration solution at home by mixing the following ingredients together:   - tsp table salt.   tsp baking soda.   tsp salt substitute containing potassium chloride.  1  tablespoons sugar.  1 L (34 oz) of water.  Certain foods and beverages may increase the speed at which food moves through the gastrointestinal (GI) tract. These foods and beverages should be avoided and include:  Caffeinated and alcoholic beverages.  High-fiber foods, such as raw fruits and vegetables, nuts, seeds, and whole grain breads and cereals.  Foods and beverages sweetened with sugar alcohols, such as xylitol, sorbitol, and mannitol.  Some foods may be well tolerated and may help thicken stool  including:  Starchy foods, such as rice, toast, pasta, low-sugar cereal, oatmeal, grits, baked potatoes, crackers, and bagels.  Bananas.  Applesauce.  Add probiotic-rich foods to help increase healthy bacteria in the GI tract, such as yogurt and fermented milk products.  Wash your hands well after each diarrhea episode.  Only take over-the-counter or prescription medicines as directed by your caregiver.  Take a warm bath to relieve any burning or pain from frequent diarrhea episodes. SEEK IMMEDIATE MEDICAL CARE IF:   You are unable to keep fluids down.  You have persistent vomiting.  You have blood in your stool, or your stools are black and tarry.  You do not urinate in 6-8 hours, or there is only a small amount of very dark urine.  You have abdominal pain that increases or localizes.  You have weakness, dizziness, confusion, or light-headedness.  You have a severe headache.  Your diarrhea gets worse or does not get better.  You have a fever or persistent symptoms for more than 2-3 days.  You have a fever and your symptoms suddenly get worse. MAKE SURE YOU:   Understand these instructions.  Will watch your condition.  Will get help right away if you are not doing well or get worse. Document Released: 03/23/2002 Document Revised: 08/17/2013 Document Reviewed: 12/09/2011 [facility] Patient Information 2015 [place], [place]. This information is not intended to replace advice given to you by your health care provider. Make sure you discuss any  questions you have with your health care provider.  Abdominal Pain Many things can cause abdominal pain. Usually, abdominal pain is not caused by a disease and will improve without treatment. It can often be observed and treated at home. Your health care provider will do a physical exam and possibly order blood tests and X-rays to help determine the seriousness of your pain. However, in many cases, more time must pass before a clear  cause of the pain can be found. Before that point, your health care provider may not know if you need more testing or further treatment. HOME CARE INSTRUCTIONS  Monitor your abdominal pain for any changes. The following actions may help to alleviate any discomfort you are experiencing:  Only take over-the-counter or prescription medicines as directed by your health care provider.  Do not take laxatives unless directed to do so by your health care provider.  Try a clear liquid diet (broth, tea, or water) as directed by your health care provider. Slowly move to a bland diet as tolerated. SEEK MEDICAL CARE IF:  You have unexplained abdominal pain.  You have abdominal pain associated with nausea or diarrhea.  You have pain when you urinate or have a bowel movement.  You experience abdominal pain that wakes you in the night.  You have abdominal pain that is worsened or improved by eating food.  You have abdominal pain that is worsened with eating fatty foods.  You have a fever. SEEK IMMEDIATE MEDICAL CARE IF:   Your pain does not go away within 2 hours.  You keep throwing up (vomiting).  Your pain is felt only in portions of the abdomen, such as the right side or the left lower portion of the abdomen.  You pass bloody or black tarry stools. MAKE SURE YOU:  Understand these instructions.   Will watch your condition.   Will get help right away if you are not doing well or get worse.  Document Released: 01/10/2005 Document Revised: 04/07/2013 Document Reviewed: 12/10/2012 [facility] Patient Information 2015 [place]. This information is not intended to replace advice given to you by your health care provider. Make sure you discuss any questions you have with your health care provider.

## 2014-05-17 NOTE — ED Notes (Signed)
Pt able to tolerate PO liquids.

## 2014-05-17 NOTE — ED Provider Notes (Signed)
CSN: [id number]     Arrival date & time 05/17/14  1225 History   First MD Initiated Contact with Patient 05/17/14 1334     Chief Complaint  Patient presents with  . Abdominal Pain  . Headache   Daniel [name] is a 49 y.o. male with a history of depression, anxiety and chronic low back pain who presents to the emergency department complaining of generalized abdominal pain, nausea, vomiting and diarrhea since 4 AM this morning. He also reports developing a headache after onset of his symptoms. Patient reports he started having nausea and vomiting around 4 AM this morning and progressed to mid abdominal pain as well as diarrhea. Patient reports 10 episodes of vomiting and 6 episodes of diarrhea today. Patient rates his abdominal pain at 8 out of 10 and sharp. He reports it's constant. Patient rates his headache at 8 out of 10 as well. Patient procedures alcohol occasionally and hasn't drank any alcohol recently. Patient denies sick contacts. He denies previous abdominal surgeries. Patient reports chills but no fevers. Patient denies lightheadedness, dizziness, cough, chest pain, shortness of breath, hematemesis, hematochezia, dysuria, hematuria, or changes to his vision.  (Consider location/radiation/quality/duration/timing/severity/associated sxs/prior Treatment) HPI  Past Medical History  Diagnosis Date  . Rectal abscess   . Internal hemorrhoids with other complication   . Chronic lower back pain   . Depression   . Anxiety    Past Surgical History  Procedure Laterality Date  . Incision and drainage perirectal abscess  10/2010  . Hernia repair     No family history on file. History  Substance Use Topics  . Smoking status: Current Every Day Smoker -- 0.75 packs/day for 28 years    Types: Cigarettes  . Smokeless tobacco: Not on file  . Alcohol Use: Yes     Comment: 11/11/2012 "probably 2, 40oz beers/month"    Review of Systems  Constitutional: Positive for chills. Negative for fever.   HENT: Negative for congestion, ear pain, sore throat and trouble swallowing.   Eyes: Negative for pain and visual disturbance.  Respiratory: Negative for cough, shortness of breath and wheezing.   Cardiovascular: Negative for chest pain.  Gastrointestinal: Positive for nausea, vomiting, abdominal pain and diarrhea. Negative for blood in stool.  Genitourinary: Negative for dysuria, hematuria and flank pain.  Musculoskeletal: Negative for back pain and neck pain.  Skin: Negative for rash.  Neurological: Positive for headaches. Negative for dizziness, syncope, weakness, light-headedness and numbness.      Allergies  Review of patient's allergies indicates no known allergies.  Home Medications   Prior to Admission medications   Medication Sig Start Date End Date Taking? Authorizing Provider  albuterol (PROVENTIL HFA;VENTOLIN HFA) 108 (90 BASE) MCG/ACT inhaler Inhale 2 puffs into the lungs every 6 (six) hours as needed for wheezing. 11/12/12   Hollice Espy, MD  dicyclomine (BENTYL) 20 MG tablet Take 1 tablet (20 mg total) by mouth 2 (two) times daily. 05/17/14   Einar Gip Deyanira Fesler, PA-C  ondansetron (ZOFRAN ODT) 4 MG disintegrating tablet Take 1 tablet (4 mg total) by mouth every 8 (eight) hours as needed for nausea or vomiting. 05/17/14   Einar Gip Haivyn Oravec, PA-C  oxyCODONE-acetaminophen (PERCOCET/ROXICET) 5-325 MG per tablet Take 1-2 tablets by mouth every 6 (six) hours as needed for pain. 11/20/12   Charles B. Bernette Mayers, MD  promethazine (PHENERGAN) 25 MG tablet Take 1 tablet (25 mg total) by mouth every 6 (six) hours as needed for nausea. 11/20/12   Charles B. Bernette Mayers,  MD   BP 128/92 mmHg  Pulse 60  Temp(Src) 98.5 F (36.9 C) (Oral)  Resp 16  Ht 5\' 9"  (1.753 m)  Wt 145 lb (65.772 kg)  BMI 21.40 kg/m2  SpO2 91% Physical Exam  Constitutional: He is oriented to person, place, and time. He appears well-developed and well-nourished. No distress.  HENT:  Head: Normocephalic and  atraumatic.  Right Ear: External ear normal.  Left Ear: External ear normal.  Nose: Nose normal.  Mouth/Throat: Oropharynx is clear and moist. No oropharyngeal exudate.  Bilateral tympanic membranes are pearly-gray without erythema or loss of landmarks. No temporal edema.  Eyes: Conjunctivae and EOM are normal. Pupils are equal, round, and reactive to light. Right eye exhibits no discharge. Left eye exhibits no discharge.  Neck: Normal range of motion. Neck supple.  Cardiovascular: Normal rate, regular rhythm, normal heart sounds and intact distal pulses.  Exam reveals no gallop and no friction rub.   No murmur heard. Pulmonary/Chest: Effort normal and breath sounds normal. No respiratory distress. He has no wheezes. He has no rales.  Abdominal: Soft. Bowel sounds are normal. He exhibits no distension and no mass. There is tenderness. There is no rebound and no guarding.  Abdomen is soft. Patient's abdomen is generally tender to palpation. No rebound tenderness. Negative psoas and obturator sign.   Musculoskeletal: He exhibits no edema.  Lymphadenopathy:    He has no cervical adenopathy.  Neurological: He is alert and oriented to person, place, and time. No cranial nerve deficit. Coordination normal.  Skin: Skin is warm and dry. No rash noted. He is not diaphoretic. No erythema. No pallor.  Psychiatric: He has a normal mood and affect. His behavior is normal.  Nursing note and vitals reviewed.   ED Course  Procedures (including critical care time) Labs Review Labs Reviewed  CBC WITH DIFFERENTIAL/PLATELET - Abnormal; Notable for the following:    WBC 14.6 (*)    Neutrophils Relative % 92 (*)    Neutro Abs 13.3 (*)    Lymphocytes Relative 7 (*)    Monocytes Relative 1 (*)    All other components within normal limits  COMPREHENSIVE METABOLIC PANEL - Abnormal; Notable for the following:    Glucose, Bld 124 (*)    Total Protein 8.8 (*)    All other components within normal limits   URINALYSIS, ROUTINE W REFLEX MICROSCOPIC - Abnormal; Notable for the following:    APPearance CLOUDY (*)    Hgb urine dipstick SMALL (*)    Ketones, ur 40 (*)    Protein, ur 100 (*)    Leukocytes, UA SMALL (*)    All other components within normal limits  URINE MICROSCOPIC-ADD ON - Abnormal; Notable for the following:    Squamous Epithelial / LPF FEW (*)    Bacteria, UA FEW (*)    All other components within normal limits  LIPASE, BLOOD    Imaging Review No results found.   EKG Interpretation None      Filed Vitals:   05/17/14 1236 05/17/14 1339 05/17/14 1515 05/17/14 1530  BP: 148/94 130/100 133/93 128/92  Pulse: 64 56 77 60  Temp: 98.5 F (36.9 C)     TempSrc: Oral     Resp: 16     Height: 5\' 9"  (1.753 m)     Weight: 145 lb (65.772 kg)     SpO2: 99% 95% 100% 91%     MDM   Final diagnoses:  Nausea vomiting and diarrhea  Generalized abdominal pain  This is a 49 y.o. male with a history of depression, anxiety and chronic low back pain who presents to the emergency department complaining of generalized abdominal pain, nausea, vomiting and diarrhea since 4 AM this morning. He also reports onset of HA after nausea, vomiting and diarrhea. He is afebrile and non-toxic appearing. Patient has generalized abdominal tenderness. No peritoneal signs. Urinalysis is negative for infection. Patient has a slightly elevated white count of 14.6. Patient's CBC is otherwise unremarkable. Patient's CMP is unremarkable. Lipase is 28.   At revaluation at 1500 the patient reports his pain has resolved, but still has some nausea. He has not had any vomiting or diarrhea since arrival. Will give zofran and then try PO trial with water and revaluate.  At revaluation at 1545 the patient reports feeling better after Zofran and has tolerated water by mouth. Will discharge with zofran and bentyl. Education provided on symptomatic treatment of viral GI illness. I advised the patient to follow-up with  their primary care provider this week. I advised the patient to return to the emergency department with new or worsening symptoms or new concerns. The patient verbalized understanding and agreement with plan.   This patient was discussed with Dr. Anitra Lauth who agrees with assessment and plan.      Lawana Chambers, PA-C 05/17/14 4098  Gwyneth Sprout, MD 05/17/14 [phone]

## 2015-05-03 DIAGNOSIS — H543 Unqualified visual loss, both eyes: Secondary | ICD-10-CM

## 2015-05-03 NOTE — Congregational Nurse Program (Signed)
Congregational Nurse Program Note  Date of Encounter: 05/03/2015  Past Medical History: Past Medical History  Diagnosis Date  . Rectal abscess   . Internal hemorrhoids with other complication   . Chronic lower back pain   . Depression   . Anxiety     Encounter Details:     CNP Questionnaire - 05/03/15 1112    Patient Demographics   Is this a new or existing patient? New   Patient is considered a/an Not Applicable   Race African-American/Black   Patient Assistance   Location of Patient Assistance Not Applicable   Patient's financial/insurance status Self-Pay   Uninsured Patient No   Patient referred to apply for the following financial assistance Not Applicable   Food insecurities addressed Not Applicable   Transportation assistance No   Assistance securing medications No   Educational health offerings Not Applicable   Encounter Details   Primary purpose of visit Other   Was an Emergency Department visit averted? No   Does patient have a medical provider? No   Was a mental health screening completed? (GAINS tool) No   Does patient have dental issues? No   Since previous encounter, have you referred patient for abnormal blood pressure that resulted in a new diagnosis or medication change? No   Since previous encounter, have you referred patient for abnormal blood glucose that resulted in a new diagnosis or medication change? No   For Abstraction Use Only   Does patient have insurance? Yes     S-forty 50 year old black single male seen at [facility]  O- Complained about not being able to see well from both eyes  A- Impaired vision  P- Client needs an eye exam referred to [facility]

## 2015-05-25 ENCOUNTER — Emergency Department (HOSPITAL_COMMUNITY)
Admission: EM | Admit: 2015-05-25 | Discharge: 2015-05-26 | Disposition: A | Discharge status: Home / Self Care | Payer: Self-pay | Attending: Emergency Medicine | Admitting: Emergency Medicine

## 2015-05-25 ENCOUNTER — Encounter (HOSPITAL_COMMUNITY): Payer: Self-pay

## 2015-05-25 DIAGNOSIS — G8929 Other chronic pain: Secondary | ICD-10-CM | POA: Insufficient documentation

## 2015-05-25 DIAGNOSIS — Z8659 Personal history of other mental and behavioral disorders: Secondary | ICD-10-CM | POA: Insufficient documentation

## 2015-05-25 DIAGNOSIS — Z79899 Other long term (current) drug therapy: Secondary | ICD-10-CM | POA: Insufficient documentation

## 2015-05-25 DIAGNOSIS — R6883 Chills (without fever): Secondary | ICD-10-CM | POA: Insufficient documentation

## 2015-05-25 DIAGNOSIS — Z9889 Other specified postprocedural states: Secondary | ICD-10-CM | POA: Insufficient documentation

## 2015-05-25 DIAGNOSIS — Z8719 Personal history of other diseases of the digestive system: Secondary | ICD-10-CM | POA: Insufficient documentation

## 2015-05-25 DIAGNOSIS — R1084 Generalized abdominal pain: Secondary | ICD-10-CM | POA: Insufficient documentation

## 2015-05-25 DIAGNOSIS — F1210 Cannabis abuse, uncomplicated: Secondary | ICD-10-CM | POA: Insufficient documentation

## 2015-05-25 DIAGNOSIS — F17210 Nicotine dependence, cigarettes, uncomplicated: Secondary | ICD-10-CM | POA: Insufficient documentation

## 2015-05-25 DIAGNOSIS — R112 Nausea with vomiting, unspecified: Secondary | ICD-10-CM | POA: Insufficient documentation

## 2015-05-25 LAB — I-STAT CHEM 8, ED
BUN: 11 mg/dL (ref 6–20)
CALCIUM ION: 1.01 mmol/L — AB (ref 1.12–1.23)
CREATININE: 0.8 mg/dL (ref 0.61–1.24)
Chloride: 105 mmol/L (ref 101–111)
GLUCOSE: 107 mg/dL — AB (ref 65–99)
HCT: 48 % (ref 39.0–52.0)
HEMOGLOBIN: 16.3 g/dL (ref 13.0–17.0)
POTASSIUM: 3.8 mmol/L (ref 3.5–5.1)
Sodium: 143 mmol/L (ref 135–145)
TCO2: 27 mmol/L (ref 0–100)

## 2015-05-25 LAB — CBC WITH DIFFERENTIAL/PLATELET
BASOS ABS: 0 10*3/uL (ref 0.0–0.1)
Basophils Relative: 0 %
EOS PCT: 0 %
Eosinophils Absolute: 0 10*3/uL (ref 0.0–0.7)
HEMATOCRIT: 46 % (ref 39.0–52.0)
Hemoglobin: 15.1 g/dL (ref 13.0–17.0)
LYMPHS ABS: 1.3 10*3/uL (ref 0.7–4.0)
LYMPHS PCT: 10 %
MCH: 31.2 pg (ref 26.0–34.0)
MCHC: 32.8 g/dL (ref 30.0–36.0)
MCV: 95 fL (ref 78.0–100.0)
MONO ABS: 0.3 10*3/uL (ref 0.1–1.0)
MONOS PCT: 2 %
NEUTROS ABS: 11.2 10*3/uL — AB (ref 1.7–7.7)
Neutrophils Relative %: 88 %
PLATELETS: 204 10*3/uL (ref 150–400)
RBC: 4.84 MIL/uL (ref 4.22–5.81)
RDW: 13.4 % (ref 11.5–15.5)
WBC: 12.8 10*3/uL — ABNORMAL HIGH (ref 4.0–10.5)

## 2015-05-25 MED ORDER — KETOROLAC TROMETHAMINE 60 MG/2ML IM SOLN
60.0000 mg | Freq: Once | INTRAMUSCULAR | Status: AC
  Administered 2015-05-25: 60 mg via INTRAMUSCULAR
  Filled 2015-05-25: qty 2

## 2015-05-25 MED ORDER — HALOPERIDOL LACTATE 5 MG/ML IJ SOLN
2.0000 mg | Freq: Once | INTRAMUSCULAR | Status: AC
  Administered 2015-05-25: 2 mg via INTRAMUSCULAR
  Filled 2015-05-25: qty 1

## 2015-05-25 NOTE — ED Provider Notes (Signed)
CSN: [id number]     Arrival date & time 05/25/15  2234 History   By signing my name below, I, Arlan Organ, attest that this documentation has been prepared under the direction and in the presence of Theodore Rahrig, MD.  Electronically Signed: Arlan Organ, ED Scribe. 05/25/2015. 11:25 PM.   Chief Complaint  Patient presents with  . Abdominal Pain  . Emesis   Patient is a 50 y.o. male presenting with abdominal pain. The history is provided by the patient. No language interpreter was used.  Abdominal Pain Pain location:  Generalized Pain quality: cramping   Pain radiates to:  Does not radiate Pain severity:  Moderate Onset quality:  Gradual Duration:  3 hours Timing:  Constant Progression:  Unchanged Chronicity:  New Context: not alcohol use and not eating   Relieved by:  None tried Worsened by:  Nothing tried Ineffective treatments:  None tried Associated symptoms: chills, nausea and vomiting   Associated symptoms: no chest pain, no constipation, no cough, no dysuria, no fever and no shortness of breath   Risk factors: no alcohol abuse and no recent hospitalization     HPI Comments: Daniel [name] is a 50 y.o. male without any pertinent past medical history who presents to the Emergency Department complaining of constant, ongoing diffuse abdominal pain onset 8:00 PM this evening. Pt also reports ongoing nausea and vomiting. 7 episodes of vomiting reported since onset of symptoms. No aggravating or alleviating factors at this time. No OTC medications or home remedies attempted prior to arrival. No recent fever, chills, diarrhea, dysuria, or constipation. No known sick contacts. Pt is an every day smoker and typically smokes half a pack a day. He denies any illicit drug use or alcohol consumption.  PCP: No PCP Per Patient    Past Medical History  Diagnosis Date  . Rectal abscess   . Internal hemorrhoids with other complication   . Chronic lower back pain   . Depression   . Anxiety     Past Surgical History  Procedure Laterality Date  . Incision and drainage perirectal abscess  10/2010  . Hernia repair     History reviewed. No pertinent family history. Social History  Substance Use Topics  . Smoking status: Current Every Day Smoker -- 0.75 packs/day for 28 years    Types: Cigarettes  . Smokeless tobacco: None  . Alcohol Use: Yes     Comment: 11/11/2012 "probably 2, 40oz beers/month"    Review of Systems  Constitutional: Positive for chills. Negative for fever.  Respiratory: Negative for cough and shortness of breath.   Cardiovascular: Negative for chest pain.  Gastrointestinal: Positive for nausea, vomiting and abdominal pain. Negative for constipation.  Genitourinary: Negative for dysuria.  Musculoskeletal: Negative for back pain.  Neurological: Negative for headaches.  Psychiatric/Behavioral: Negative for confusion.  All other systems reviewed and are negative.     Allergies  Review of patient's allergies indicates no known allergies.  Home Medications   Prior to Admission medications   Medication Sig Start Date End Date Taking? Authorizing Provider  albuterol (PROVENTIL HFA;VENTOLIN HFA) 108 (90 BASE) MCG/ACT inhaler Inhale 2 puffs into the lungs every 6 (six) hours as needed for wheezing. 11/12/12  Yes Hollice Espy, MD  dicyclomine (BENTYL) 20 MG tablet Take 1 tablet (20 mg total) by mouth 2 (two) times daily. Patient not taking: Reported on 05/25/2015 05/17/14   Everlene Farrier, PA-C  ondansetron (ZOFRAN ODT) 4 MG disintegrating tablet Take 1 tablet (4 mg total)  by mouth every 8 (eight) hours as needed for nausea or vomiting. Patient not taking: Reported on 05/25/2015 05/17/14   Everlene Farrier, PA-C  oxyCODONE-acetaminophen (PERCOCET/ROXICET) 5-325 MG per tablet Take 1-2 tablets by mouth every 6 (six) hours as needed for pain. Patient not taking: Reported on 05/25/2015 11/20/12   Susy Frizzle, MD  promethazine (PHENERGAN) 25 MG tablet Take 1 tablet (25  mg total) by mouth every 6 (six) hours as needed for nausea. Patient not taking: Reported on 05/25/2015 11/20/12   Susy Frizzle, MD   Triage Vitals: BP 147/75 mmHg  Pulse 65  Temp(Src) 98.4 F (36.9 C) (Oral)  Resp 18  SpO2 100%   Physical Exam  Constitutional: He is oriented to person, place, and time. He appears well-developed and well-nourished. No distress.  HENT:  Head: Normocephalic and atraumatic.  Mouth/Throat: Oropharynx is clear and moist. No oropharyngeal exudate.  Trachea is midline  Eyes: EOM are normal. Pupils are equal, round, and reactive to light.  Neck: Normal range of motion. Neck supple.  No carotid bruits   Cardiovascular: Normal rate, regular rhythm, normal heart sounds and intact distal pulses.   Pulmonary/Chest: Effort normal and breath sounds normal. No stridor. No respiratory distress. He has no wheezes. He has no rales.  Abdominal: Soft. He exhibits no distension. There is no tenderness. There is no rebound and no guarding.  Hyperactive bowel sounds   Musculoskeletal: Normal range of motion.  Neurological: He is alert and oriented to person, place, and time. He has normal reflexes.  Skin: Skin is warm and dry.  Psychiatric: He has a normal mood and affect. Judgment normal.  Nursing note and vitals reviewed.   ED Course  Procedures (including critical care time)  DIAGNOSTIC STUDIES: Oxygen Saturation is 100% on RA, Normal by my interpretation.    COORDINATION OF CARE: 11:09 PM- Will order CBC, i-stat chem 8, hepatic function panel, lipase, and urinalysis. Discussed treatment plan with pt at bedside and pt agreed to plan.     Labs Review Labs Reviewed  CBC WITH DIFFERENTIAL/PLATELET - Abnormal; Notable for the following:    WBC 12.8 (*)    Neutro Abs 11.2 (*)    All other components within normal limits  HEPATIC FUNCTION PANEL - Abnormal; Notable for the following:    Total Protein 8.2 (*)    All other components within normal limits   URINALYSIS, ROUTINE W REFLEX MICROSCOPIC (NOT AT [facility]) - Abnormal; Notable for the following:    APPearance CLOUDY (*)    Hgb urine dipstick MODERATE (*)    Ketones, ur 40 (*)    Protein, ur 30 (*)    Leukocytes, UA SMALL (*)    All other components within normal limits  URINE RAPID DRUG SCREEN, HOSP PERFORMED - Abnormal; Notable for the following:    Tetrahydrocannabinol POSITIVE (*)    All other components within normal limits  URINE MICROSCOPIC-ADD ON - Abnormal; Notable for the following:    Squamous Epithelial / LPF 0-5 (*)    Bacteria, UA FEW (*)    Casts GRANULAR CAST (*)    All other components within normal limits  I-STAT CHEM 8, ED - Abnormal; Notable for the following:    Glucose, Bld 107 (*)    Calcium, Ion 1.01 (*)    All other components within normal limits  URINE CULTURE  LIPASE, BLOOD    Imaging Review No results found. I have personally reviewed and evaluated these lab results as part of my medical decision-making.  EKG Interpretation None      MDM   Final diagnoses:  None   Medications  ketorolac (TORADOL) injection 60 mg (60 mg Intramuscular Given 05/25/15 2342)  haloperidol lactate (HALDOL) injection 2 mg (2 mg Intramuscular Given 05/25/15 2341)  gi cocktail (Maalox,Lidocaine,Donnatal) (30 mLs Oral Given 05/26/15 0043)  dicyclomine (BENTYL) injection 20 mg (20 mg Intramuscular Given 05/26/15 0042)  haloperidol lactate (HALDOL) injection 5 mg (5 mg Intramuscular Given 05/26/15 0229)   Results for orders placed or performed during the hospital encounter of 05/25/15  CBC with Differential/Platelet  Result Value Ref Range   WBC 12.8 (H) 4.0 - 10.5 K/uL   RBC 4.84 4.22 - 5.81 MIL/uL   Hemoglobin 15.1 13.0 - 17.0 g/dL   HCT 16.1 09.6 - 04.5 %   MCV 95.0 78.0 - 100.0 fL   MCH 31.2 26.0 - 34.0 pg   MCHC 32.8 30.0 - 36.0 g/dL   RDW 40.9 81.1 - 91.4 %   Platelets 204 150 - 400 K/uL   Neutrophils Relative % 88 %   Neutro Abs 11.2 (H) 1.7 - 7.7 K/uL    Lymphocytes Relative 10 %   Lymphs Abs 1.3 0.7 - 4.0 K/uL   Monocytes Relative 2 %   Monocytes Absolute 0.3 0.1 - 1.0 K/uL   Eosinophils Relative 0 %   Eosinophils Absolute 0.0 0.0 - 0.7 K/uL   Basophils Relative 0 %   Basophils Absolute 0.0 0.0 - 0.1 K/uL  Hepatic function panel  Result Value Ref Range   Total Protein 8.2 (H) 6.5 - 8.1 g/dL   Albumin 4.4 3.5 - 5.0 g/dL   AST 36 15 - 41 U/L   ALT 22 17 - 63 U/L   Alkaline Phosphatase 80 38 - 126 U/L   Total Bilirubin 0.5 0.3 - 1.2 mg/dL   Bilirubin, Direct 0.1 0.1 - 0.5 mg/dL   Indirect Bilirubin 0.4 0.3 - 0.9 mg/dL  Lipase, blood  Result Value Ref Range   Lipase 27 11 - 51 U/L  Urinalysis, Routine w reflex microscopic (not at [facility]  Result Value Ref Range   Color, Urine YELLOW YELLOW   APPearance CLOUDY (A) CLEAR   Specific Gravity, Urine 1.023 1.005 - 1.030   pH 7.0 5.0 - 8.0   Glucose, UA NEGATIVE NEGATIVE mg/dL   Hgb urine dipstick MODERATE (A) NEGATIVE   Bilirubin Urine NEGATIVE NEGATIVE   Ketones, ur 40 (A) NEGATIVE mg/dL   Protein, ur 30 (A) NEGATIVE mg/dL   Nitrite NEGATIVE NEGATIVE   Leukocytes, UA SMALL (A) NEGATIVE  Urine rapid drug screen (hosp performed)  Result Value Ref Range   Opiates NONE DETECTED NONE DETECTED   Cocaine NONE DETECTED NONE DETECTED   Benzodiazepines NONE DETECTED NONE DETECTED   Amphetamines NONE DETECTED NONE DETECTED   Tetrahydrocannabinol POSITIVE (A) NONE DETECTED   Barbiturates NONE DETECTED NONE DETECTED  Urine microscopic-add on  Result Value Ref Range   Squamous Epithelial / LPF 0-5 (A) NONE SEEN   WBC, UA 6-30 0 - 5 WBC/hpf   RBC / HPF 6-30 0 - 5 RBC/hpf   Bacteria, UA FEW (A) NONE SEEN   Casts GRANULAR CAST (A) NEGATIVE  I-stat chem 8, ed  Result Value Ref Range   Sodium 143 135 - 145 mmol/L   Potassium 3.8 3.5 - 5.1 mmol/L   Chloride 105 101 - 111 mmol/L   BUN 11 6 - 20 mg/dL   Creatinine, Ser 7.82 0.61 - 1.24 mg/dL   Glucose, Bld  107 (H) 65 - 99 mg/dL   Calcium,  Ion 4.09 (L) 1.12 - 1.23 mmol/L   TCO2 27 0 - 100 mmol/L   Hemoglobin 16.3 13.0 - 17.0 g/dL   HCT 81.1 91.4 - 78.2 %   Dg Abd Acute W/chest  05/26/2015  CLINICAL DATA:  Acute onset of vomiting and diarrhea. Lower mid abdominal pain and fever. Initial encounter. EXAM: DG ABDOMEN ACUTE W/ 1V CHEST COMPARISON:  CT of the chest, abdomen and pelvis from 11/11/2012 FINDINGS: The lungs are well-aerated. Vascular congestion is noted. There is no evidence of focal opacification, pleural effusion or pneumothorax. The cardiomediastinal silhouette is within normal limits. The visualized bowel gas pattern is unremarkable. Scattered stool and air are seen within the colon; there is no evidence of small bowel dilatation to suggest obstruction. No free intra-abdominal air is identified on the provided decubitus view. A 1.3 cm stone is noted at the right kidney. No acute osseous abnormalities are seen; the sacroiliac joints are unremarkable in appearance. IMPRESSION: 1. Unremarkable bowel gas pattern; no free intra-abdominal air seen. Small amount of stool noted in the colon. 2. Vascular congestion noted.  Lungs remain grossly clear. 3. 1.3 cm nonobstructing right renal stone noted. Electronically Signed   By: Roanna Raider M.D.   On: 05/26/2015 03:18      Marijuana induced hyperemesis and abdominal pain.  PO challenged successfully in the ED  I personally performed the services described in this documentation, which was scribed in my presence. The recorded information has been reviewed and is accurate.     Cy Blamer, MD 05/26/15 [phone]

## 2015-05-25 NOTE — ED Notes (Signed)
BIB EMS. Started having abdominal pain since 2000. Pt states that he drank coffee and began having abdominal pain and vomiting. Vomited > 5 times since 2000. Pain in upper abdomen. 8/10.  CBG 111 150/80 HR80 98%

## 2015-05-25 NOTE — ED Notes (Signed)
Bed: WA07 Expected date:  Expected time:  Means of arrival:  Comments: EMS 50yo M abd pain / Vomiting

## 2015-05-26 ENCOUNTER — Emergency Department (HOSPITAL_COMMUNITY): Payer: Self-pay

## 2015-05-26 LAB — URINALYSIS, ROUTINE W REFLEX MICROSCOPIC
Bilirubin Urine: NEGATIVE
GLUCOSE, UA: NEGATIVE mg/dL
KETONES UR: 40 mg/dL — AB
NITRITE: NEGATIVE
PH: 7 (ref 5.0–8.0)
Protein, ur: 30 mg/dL — AB
SPECIFIC GRAVITY, URINE: 1.023 (ref 1.005–1.030)

## 2015-05-26 LAB — RAPID URINE DRUG SCREEN, HOSP PERFORMED
AMPHETAMINES: NOT DETECTED
BARBITURATES: NOT DETECTED
BENZODIAZEPINES: NOT DETECTED
COCAINE: NOT DETECTED
Opiates: NOT DETECTED
Tetrahydrocannabinol: POSITIVE — AB

## 2015-05-26 LAB — HEPATIC FUNCTION PANEL
ALBUMIN: 4.4 g/dL (ref 3.5–5.0)
ALK PHOS: 80 U/L (ref 38–126)
ALT: 22 U/L (ref 17–63)
AST: 36 U/L (ref 15–41)
Bilirubin, Direct: 0.1 mg/dL (ref 0.1–0.5)
Indirect Bilirubin: 0.4 mg/dL (ref 0.3–0.9)
TOTAL PROTEIN: 8.2 g/dL — AB (ref 6.5–8.1)
Total Bilirubin: 0.5 mg/dL (ref 0.3–1.2)

## 2015-05-26 LAB — URINE MICROSCOPIC-ADD ON

## 2015-05-26 LAB — LIPASE, BLOOD: Lipase: 27 U/L (ref 11–51)

## 2015-05-26 MED ORDER — HALOPERIDOL LACTATE 5 MG/ML IJ SOLN
5.0000 mg | Freq: Once | INTRAMUSCULAR | Status: AC
  Administered 2015-05-26: 5 mg via INTRAMUSCULAR
  Filled 2015-05-26: qty 1

## 2015-05-26 MED ORDER — HALOPERIDOL LACTATE 5 MG/ML IJ SOLN: 5.0000 mg | Freq: Once | INTRAMUSCULAR | Status: DC

## 2015-05-26 MED ORDER — ONDANSETRON 8 MG PO TBDP: ORAL_TABLET | ORAL | Status: DC

## 2015-05-26 MED ORDER — DICYCLOMINE HCL 10 MG/ML IM SOLN
20.0000 mg | Freq: Once | INTRAMUSCULAR | Status: AC
  Administered 2015-05-26: 20 mg via INTRAMUSCULAR
  Filled 2015-05-26: qty 2

## 2015-05-26 MED ORDER — GI COCKTAIL ~~LOC~~
30.0000 mL | Freq: Once | ORAL | Status: AC
  Administered 2015-05-26: 30 mL via ORAL
  Filled 2015-05-26: qty 30

## 2015-05-26 NOTE — ED Notes (Signed)
Pt was able to tolerate 8 oz of ginger ale without vomiting.

## 2015-05-26 NOTE — Discharge Instructions (Signed)

## 2015-05-27 LAB — URINE CULTURE: SPECIAL REQUESTS: NORMAL

## 2015-08-04 ENCOUNTER — Encounter (HOSPITAL_COMMUNITY): Payer: Self-pay | Admitting: Emergency Medicine

## 2015-08-04 ENCOUNTER — Emergency Department (HOSPITAL_COMMUNITY)
Admission: EM | Admit: 2015-08-04 | Discharge: 2015-08-04 | Disposition: A | Discharge status: Home / Self Care | Payer: Self-pay | Attending: Emergency Medicine | Admitting: Emergency Medicine

## 2015-08-04 ENCOUNTER — Emergency Department (HOSPITAL_COMMUNITY): Payer: Self-pay

## 2015-08-04 DIAGNOSIS — R109 Unspecified abdominal pain: Secondary | ICD-10-CM | POA: Insufficient documentation

## 2015-08-04 DIAGNOSIS — R112 Nausea with vomiting, unspecified: Secondary | ICD-10-CM | POA: Insufficient documentation

## 2015-08-04 DIAGNOSIS — Z8719 Personal history of other diseases of the digestive system: Secondary | ICD-10-CM | POA: Insufficient documentation

## 2015-08-04 DIAGNOSIS — F17210 Nicotine dependence, cigarettes, uncomplicated: Secondary | ICD-10-CM | POA: Insufficient documentation

## 2015-08-04 DIAGNOSIS — Z8659 Personal history of other mental and behavioral disorders: Secondary | ICD-10-CM | POA: Insufficient documentation

## 2015-08-04 DIAGNOSIS — G8929 Other chronic pain: Secondary | ICD-10-CM | POA: Insufficient documentation

## 2015-08-04 LAB — COMPREHENSIVE METABOLIC PANEL
ALT: 19 U/L (ref 17–63)
ANION GAP: 17 — AB (ref 5–15)
AST: 30 U/L (ref 15–41)
Albumin: 4.9 g/dL (ref 3.5–5.0)
Alkaline Phosphatase: 82 U/L (ref 38–126)
BILIRUBIN TOTAL: 1.1 mg/dL (ref 0.3–1.2)
BUN: 11 mg/dL (ref 6–20)
CO2: 24 mmol/L (ref 22–32)
Calcium: 10.3 mg/dL (ref 8.9–10.3)
Chloride: 104 mmol/L (ref 101–111)
Creatinine, Ser: 0.94 mg/dL (ref 0.61–1.24)
GFR calc Af Amer: >60 mL/min (ref >60)
Glucose, Bld: 132 mg/dL — ABNORMAL HIGH (ref 65–99)
POTASSIUM: 3.5 mmol/L (ref 3.5–5.1)
Sodium: 145 mmol/L (ref 135–145)
TOTAL PROTEIN: 9.3 g/dL — AB (ref 6.5–8.1)

## 2015-08-04 LAB — CBC WITH DIFFERENTIAL/PLATELET
BASOS PCT: 0 %
Basophils Absolute: 0 10*3/uL (ref 0.0–0.1)
EOS PCT: 0 %
Eosinophils Absolute: 0 10*3/uL (ref 0.0–0.7)
HEMATOCRIT: 45.2 % (ref 39.0–52.0)
Hemoglobin: 15.7 g/dL (ref 13.0–17.0)
LYMPHS PCT: 6 %
Lymphs Abs: 0.9 10*3/uL (ref 0.7–4.0)
MCH: 30.7 pg (ref 26.0–34.0)
MCHC: 34.7 g/dL (ref 30.0–36.0)
MCV: 88.3 fL (ref 78.0–100.0)
MONO ABS: 0.3 10*3/uL (ref 0.1–1.0)
MONOS PCT: 2 %
NEUTROS ABS: 12.6 10*3/uL — AB (ref 1.7–7.7)
Neutrophils Relative %: 92 %
PLATELETS: 280 10*3/uL (ref 150–400)
RBC: 5.12 MIL/uL (ref 4.22–5.81)
RDW: 13.6 % (ref 11.5–15.5)
WBC: 13.7 10*3/uL — ABNORMAL HIGH (ref 4.0–10.5)

## 2015-08-04 LAB — LIPASE, BLOOD: LIPASE: 22 U/L (ref 11–51)

## 2015-08-04 MED ORDER — HALOPERIDOL LACTATE 5 MG/ML IJ SOLN
5.0000 mg | Freq: Once | INTRAMUSCULAR | Status: AC
  Administered 2015-08-04: 5 mg via INTRAMUSCULAR
  Filled 2015-08-04: qty 1

## 2015-08-04 MED ORDER — DICYCLOMINE HCL 20 MG PO TABS: 20.0000 mg | ORAL_TABLET | Freq: Two times a day (BID) | ORAL | Status: DC

## 2015-08-04 MED ORDER — ONDANSETRON HCL 4 MG PO TABS: 4.0000 mg | ORAL_TABLET | Freq: Four times a day (QID) | ORAL | Status: DC

## 2015-08-04 MED ORDER — ONDANSETRON 4 MG PO TBDP
4.0000 mg | ORAL_TABLET | Freq: Once | ORAL | Status: AC
  Administered 2015-08-04: 4 mg via ORAL
  Filled 2015-08-04: qty 1

## 2015-08-04 MED ORDER — KETOROLAC TROMETHAMINE 60 MG/2ML IM SOLN
60.0000 mg | Freq: Once | INTRAMUSCULAR | Status: AC
  Administered 2015-08-04: 60 mg via INTRAMUSCULAR
  Filled 2015-08-04: qty 2

## 2015-08-04 NOTE — ED Notes (Signed)
Bed: WA02 Expected date:  Expected time:  Means of arrival:  Comments: EMS nausea and vomiting

## 2015-08-04 NOTE — ED Provider Notes (Signed)
CSN: [id number]     Arrival date & time 08/04/15  0228 History   First MD Initiated Contact with Patient 08/04/15 0234     Chief Complaint  Patient presents with  . Abdominal Pain     (Consider location/radiation/quality/duration/timing/severity/associated sxs/prior Treatment) HPI   Level V caveat- pt unable to speak due to clinched jaw.  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 He presents to the ER tearful and voicing that his jaw is locked. He is also complaining of diffuse abdominal pain and vomiting x multiple episodes that started at 6 pm on 08/03/2015. The patient has been here for the same cyclical vomiting that is linked to marijuana usage.   Past Medical History  Diagnosis Date  . Rectal abscess   . Internal hemorrhoids with other complication   . Chronic lower back pain   . Depression   . Anxiety    Past Surgical History  Procedure Laterality Date  . Incision and drainage perirectal abscess  10/2010  . Hernia repair     History reviewed. No pertinent family history. Social History  Substance Use Topics  . Smoking status: Current Every Day Smoker -- 0.75 packs/day for 28 years    Types: Cigarettes  . Smokeless tobacco: None  . Alcohol Use: Yes     Comment: 11/11/2012 "probably 2, 40oz beers/month"    Review of Systems   Level V caveat- pt unable to speak due to clinched jaw.   Allergies  Review of patient's allergies indicates no known allergies.  Home Medications   Prior to Admission medications   Medication Sig Start Date End Date Taking? Authorizing Provider  albuterol (PROVENTIL HFA;VENTOLIN HFA) 108 (90 BASE) MCG/ACT inhaler Inhale 2 puffs into the lungs every 6 (six) hours as needed for wheezing. Patient not taking: Reported on 08/04/2015 11/12/12   Hollice Espy, MD  dicyclomine (BENTYL) 20 MG tablet Take 1 tablet (20 mg total) by mouth 2 (two) times  daily. 08/04/15   Makayla Lanter Neva Seat, PA-C  ondansetron (ZOFRAN ODT) 8 MG disintegrating tablet  ODT q8 hours prn nausea Patient not taking: Reported on 08/04/2015 05/26/15   April Palumbo, MD  ondansetron (ZOFRAN) 4 MG tablet Take 1 tablet (4 mg total) by mouth every 6 (six) hours. 08/04/15   Darianna Amy Neva Seat, PA-C   BP 142/82 mmHg  Pulse 69  Temp(Src) 98.6 F (37 C) (Oral)  Resp 26  Ht  (1.753 m)  Wt 63.504 kg  BMI 20.67 kg/m2  SpO2 100% Physical Exam  Constitutional: He appears well-developed and well-nourished. No distress.  HENT:  Head: Normocephalic and atraumatic.  Right Ear: Tympanic membrane and ear canal normal.  Left Ear: Tympanic membrane and ear canal normal.  Nose: Nose normal.  Mouth/Throat: Uvula is midline, oropharynx is clear and moist and mucous membranes are normal.  Patient says he is unable to open his jaw but opens it to dry heave.  Eyes: Pupils are equal, round, and reactive to light.  Neck: Normal range of motion. Neck supple.  Cardiovascular: Normal rate and regular rhythm.   Pulmonary/Chest: Effort normal.  Abdominal: Soft. Bowel sounds are normal. There is tenderness (no peritoneal signs; mild). There is no rigidity, no rebound, no guarding and no CVA tenderness.  No signs of abdominal distention Pt dry heaves but no vomitus noted during exam  Musculoskeletal:  No LE swelling  Neurological: He is alert.  Acting at baseline  Skin: Skin is warm and dry. No rash noted.  Nursing note and vitals reviewed.   ED Course  Procedures (including critical care time) Labs Review Labs Reviewed  CBC WITH DIFFERENTIAL/PLATELET - Abnormal; Notable for the following:    WBC 13.7 (*)    Neutro Abs 12.6 (*)    All other components within normal limits  COMPREHENSIVE METABOLIC PANEL - Abnormal; Notable for the following:    Glucose, Bld 132 (*)    Total Protein 9.3 (*)    Anion gap 17 (*)    All other components within normal limits  LIPASE, BLOOD    Imaging  Review Dg Abd 2 Views  08/04/2015  CLINICAL DATA:  Lower abdominal pain and nausea since 08/03/2015 EXAM: ABDOMEN - 2 VIEW COMPARISON:  05/26/2015 FINDINGS: Scattered gas and stool in the colon. No small or large bowel distention. No free intra-abdominal air. No abnormal air-fluid levels. Right upper quadrant calcification likely corresponds to intrarenal stones seen on previous CT 11/11/2012. Visualized bones appear intact. IMPRESSION: Normal nonobstructive bowel gas pattern. Calcification in the right upper quadrant probably representing a renal stone. Electronically Signed   By: Burman NievesWilliam  Stevens M.D.   On: 08/04/2015 04:30   I have personally reviewed and evaluated these images and lab results as part of my medical decision-making.   EKG Interpretation None      MDM   Final diagnoses:  Abdominal pain  Non-intractable vomiting with nausea, vomiting of unspecified type    Patient discussed with Dr. Daun PeacockPalombo who recommends giving IM Toradol, IM haloperidol and Zofran. His CBC, CMP and lipase are unremarkable. The x-ray of his abdomen shows no significant findings.  After receiving the medications the patient is now able to open his mouth and talk normally. He also endorses improvement of pain is able to tolerate fluid challenge. Patient encouraged to stop smoking marijuana and given a prescription for Bentyl and Zofran.  Discussed return precautions and given follow-up information for primary care doctor and gastroenterology.  Marlon Peliffany Berl Bonfanti, PA-C 08/05/15 [phone]  April Palumbo, MD 08/05/15 [phone]

## 2015-08-04 NOTE — ED Notes (Signed)
Pt tearful.

## 2015-08-04 NOTE — Discharge Instructions (Signed)
Nausea and Vomiting °Nausea is a sick feeling that often comes before throwing up (vomiting). Vomiting is a reflex where stomach contents come out of your mouth. Vomiting can cause severe loss of body fluids (dehydration). Children and elderly adults can become dehydrated quickly, especially if they also have diarrhea. Nausea and vomiting are symptoms of a condition or disease. It is important to find the cause of your symptoms. °CAUSES  °· Direct irritation of the stomach lining. This irritation can result from increased acid production (gastroesophageal reflux disease), infection, food poisoning, taking certain medicines (such as nonsteroidal anti-inflammatory drugs), alcohol use, or tobacco use. °· Signals from the brain. These signals could be caused by a headache, heat exposure, an inner ear disturbance, increased pressure in the brain from injury, infection, a tumor, or a concussion, pain, emotional stimulus, or metabolic problems. °· An obstruction in the gastrointestinal tract (bowel obstruction). °· Illnesses such as diabetes, hepatitis, gallbladder problems, appendicitis, kidney problems, cancer, sepsis, atypical symptoms of a heart attack, or eating disorders. °· Medical treatments such as chemotherapy and radiation. °· Receiving medicine that makes you sleep (general anesthetic) during surgery. °DIAGNOSIS °Your caregiver may ask for tests to be done if the problems do not improve after a few days. Tests may also be done if symptoms are severe or if the reason for the nausea and vomiting is not clear. Tests may include: °· Urine tests. °· Blood tests. °· Stool tests. °· Cultures (to look for evidence of infection). °· X-rays or other imaging studies. °Test results can help your caregiver make decisions about treatment or the need for additional tests. °TREATMENT °You need to stay well hydrated. Drink frequently but in small amounts. You may wish to drink water, sports drinks, clear broth, or eat frozen  ice pops or gelatin dessert to help stay hydrated. When you eat, eating slowly may help prevent nausea. There are also some antinausea medicines that may help prevent nausea. °HOME CARE INSTRUCTIONS  °· Take all medicine as directed by your caregiver. °· If you do not have an appetite, do not force yourself to eat. However, you must continue to drink fluids. °· If you have an appetite, eat a normal diet unless your caregiver tells you differently. °· Eat a variety of complex carbohydrates (rice, wheat, potatoes, bread), lean meats, yogurt, fruits, and vegetables. °· Avoid high-fat foods because they are more difficult to digest. °· Drink enough water and fluids to keep your urine clear or pale yellow. °· If you are dehydrated, ask your caregiver for specific rehydration instructions. Signs of dehydration may include: °· Severe thirst. °· Dry lips and mouth. °· Dizziness. °· Dark urine. °· Decreasing urine frequency and amount. °· Confusion. °· Rapid breathing or pulse. °SEEK IMMEDIATE MEDICAL CARE IF:  °· You have blood or brown flecks (like coffee grounds) in your vomit. °· You have black or bloody stools. °· You have a severe headache or stiff neck. °· You are confused. °· You have severe abdominal pain. °· You have chest pain or trouble breathing. °· You do not urinate at least once every 8 hours. °· You develop cold or clammy skin. °· You continue to vomit for longer than 24 to 48 hours. °· You have a fever. °MAKE SURE YOU:  °· Understand these instructions. °· Will watch your condition. °· Will get help right away if you are not doing well or get worse. °  °This information is not intended to replace advice given to you by your health care provider. Make sure   you discuss any questions you have with your health care provider. °  °Document Released: 04/02/2005 Document Revised: 06/25/2011 Document Reviewed: 08/30/2010 °Elsevier Interactive Patient Education ©2016 [employer] ° °Abdominal Pain, Adult °Many  things can cause abdominal pain. Usually, abdominal pain is not caused by a disease and will improve without treatment. It can often be observed and treated at home. Your health care provider will do a physical exam and possibly order blood tests and X-rays to help determine the seriousness of your pain. However, in many cases, more time must pass before a clear cause of the pain can be found. Before that point, your health care provider may not know if you need more testing or further treatment. °HOME CARE INSTRUCTIONS °Monitor your abdominal pain for any changes. The following actions may help to alleviate any discomfort you are experiencing: °· Only take over-the-counter or prescription medicines as directed by your health care provider. °· Do not take laxatives unless directed to do so by your health care provider. °· Try a clear liquid diet (broth, tea, or water) as directed by your health care provider. Slowly move to a bland diet as tolerated. °SEEK MEDICAL CARE IF: °· You have unexplained abdominal pain. °· You have abdominal pain associated with nausea or diarrhea. °· You have pain when you urinate or have a bowel movement. °· You experience abdominal pain that wakes you in the night. °· You have abdominal pain that is worsened or improved by eating food. °· You have abdominal pain that is worsened with eating fatty foods. °· You have a fever. °SEEK IMMEDIATE MEDICAL CARE IF: °· Your pain does not go away within 2 hours. °· You keep throwing up (vomiting). °· Your pain is felt only in portions of the abdomen, such as the right side or the left lower portion of the abdomen. °· You pass bloody or black tarry stools. °MAKE SURE YOU: °· Understand these instructions. °· Will watch your condition. °· Will get help right away if you are not doing well or get worse. °  °This information is not intended to replace advice given to you by your health care provider. Make sure you discuss any questions you have with  your health care provider. °  °Document Released: 01/10/2005 Document Revised: 12/22/2014 Document Reviewed: 12/10/2012 °Elsevier Interactive Patient Education ©2016 [employer] ° °

## 2015-08-04 NOTE — ED Notes (Signed)
Pt presents to ED via EMS with c/o abdominal pain that associated with nausea/vomiting, onset around 1800.

## 2015-08-04 NOTE — ED Notes (Signed)
Urine sent to lab 

## 2015-10-08 ENCOUNTER — Encounter (HOSPITAL_COMMUNITY): Payer: Self-pay | Admitting: Emergency Medicine

## 2015-10-08 ENCOUNTER — Emergency Department (HOSPITAL_COMMUNITY): Payer: Self-pay

## 2015-10-08 ENCOUNTER — Emergency Department (HOSPITAL_COMMUNITY)
Admission: EM | Admit: 2015-10-08 | Discharge: 2015-10-08 | Disposition: A | Discharge status: Home / Self Care | Payer: Self-pay | Attending: Emergency Medicine | Admitting: Emergency Medicine

## 2015-10-08 DIAGNOSIS — R112 Nausea with vomiting, unspecified: Secondary | ICD-10-CM | POA: Insufficient documentation

## 2015-10-08 DIAGNOSIS — R109 Unspecified abdominal pain: Secondary | ICD-10-CM

## 2015-10-08 DIAGNOSIS — F17210 Nicotine dependence, cigarettes, uncomplicated: Secondary | ICD-10-CM | POA: Insufficient documentation

## 2015-10-08 LAB — CBC WITH DIFFERENTIAL/PLATELET
BASOS ABS: 0 10*3/uL (ref 0.0–0.1)
BASOS PCT: 0 %
EOS ABS: 0 10*3/uL (ref 0.0–0.7)
EOS PCT: 0 %
HCT: 46 % (ref 39.0–52.0)
Hemoglobin: 15.3 g/dL (ref 13.0–17.0)
LYMPHS ABS: 1.2 10*3/uL (ref 0.7–4.0)
Lymphocytes Relative: 10 %
MCH: 30.5 pg (ref 26.0–34.0)
MCHC: 33.3 g/dL (ref 30.0–36.0)
MCV: 91.8 fL (ref 78.0–100.0)
Monocytes Absolute: 0.2 10*3/uL (ref 0.1–1.0)
Monocytes Relative: 2 %
Neutro Abs: 11.3 10*3/uL — ABNORMAL HIGH (ref 1.7–7.7)
Neutrophils Relative %: 88 %
PLATELETS: 281 10*3/uL (ref 150–400)
RBC: 5.01 MIL/uL (ref 4.22–5.81)
RDW: 13 % (ref 11.5–15.5)
WBC: 12.8 10*3/uL — AB (ref 4.0–10.5)

## 2015-10-08 LAB — COMPREHENSIVE METABOLIC PANEL
ALK PHOS: 92 U/L (ref 38–126)
ALT: 23 U/L (ref 17–63)
AST: 33 U/L (ref 15–41)
Albumin: 4.4 g/dL (ref 3.5–5.0)
Anion gap: 13 (ref 5–15)
BILIRUBIN TOTAL: 0.4 mg/dL (ref 0.3–1.2)
BUN: 7 mg/dL (ref 6–20)
CALCIUM: 9.8 mg/dL (ref 8.9–10.3)
CO2: 24 mmol/L (ref 22–32)
CREATININE: 0.76 mg/dL (ref 0.61–1.24)
Chloride: 106 mmol/L (ref 101–111)
GFR calc Af Amer: >60 mL/min (ref >60)
Glucose, Bld: 89 mg/dL (ref 65–99)
Potassium: 4 mmol/L (ref 3.5–5.1)
Sodium: 143 mmol/L (ref 135–145)
TOTAL PROTEIN: 9.1 g/dL — AB (ref 6.5–8.1)

## 2015-10-08 LAB — URINE MICROSCOPIC-ADD ON: Squamous Epithelial / LPF: NONE SEEN

## 2015-10-08 LAB — URINALYSIS, ROUTINE W REFLEX MICROSCOPIC
Bilirubin Urine: NEGATIVE
GLUCOSE, UA: NEGATIVE mg/dL
Ketones, ur: 40 mg/dL — AB
Nitrite: NEGATIVE
PH: 7.5 (ref 5.0–8.0)
Protein, ur: 100 mg/dL — AB
Specific Gravity, Urine: 1.018 (ref 1.005–1.030)

## 2015-10-08 LAB — RAPID URINE DRUG SCREEN, HOSP PERFORMED
AMPHETAMINES: NOT DETECTED
Barbiturates: NOT DETECTED
Benzodiazepines: NOT DETECTED
COCAINE: POSITIVE — AB
OPIATES: NOT DETECTED
TETRAHYDROCANNABINOL: POSITIVE — AB

## 2015-10-08 LAB — LIPASE, BLOOD: LIPASE: 19 U/L (ref 11–51)

## 2015-10-08 MED ORDER — LORAZEPAM 2 MG/ML IJ SOLN: 1.0000 mg | Freq: Once | INTRAMUSCULAR | Status: DC

## 2015-10-08 MED ORDER — ONDANSETRON 4 MG PO TBDP
4.0000 mg | ORAL_TABLET | Freq: Once | ORAL | Status: AC
  Administered 2015-10-08: 4 mg via ORAL
  Filled 2015-10-08: qty 1

## 2015-10-08 MED ORDER — HALOPERIDOL LACTATE 5 MG/ML IJ SOLN
5.0000 mg | Freq: Once | INTRAMUSCULAR | Status: AC
  Administered 2015-10-08: 5 mg via INTRAMUSCULAR
  Filled 2015-10-08: qty 1

## 2015-10-08 MED ORDER — KETOROLAC TROMETHAMINE 30 MG/ML IJ SOLN: 30.0000 mg | Freq: Once | INTRAMUSCULAR | Status: DC

## 2015-10-08 MED ORDER — KETOROLAC TROMETHAMINE 60 MG/2ML IM SOLN
60.0000 mg | Freq: Once | INTRAMUSCULAR | Status: AC
  Administered 2015-10-08: 60 mg via INTRAMUSCULAR
  Filled 2015-10-08: qty 2

## 2015-10-08 MED ORDER — PROMETHAZINE HCL 25 MG/ML IJ SOLN
25.0000 mg | Freq: Once | INTRAMUSCULAR | Status: DC
  Filled 2015-10-08: qty 1

## 2015-10-08 MED ORDER — ONDANSETRON 4 MG PO TBDP: 4.0000 mg | ORAL_TABLET | Freq: Three times a day (TID) | ORAL | Status: DC | PRN

## 2015-10-08 NOTE — ED Notes (Signed)
Pt rolling around in bed howling and crying; family attempting to console.

## 2015-10-08 NOTE — ED Notes (Signed)
Patient is resting comfortably. 

## 2015-10-08 NOTE — ED Provider Notes (Signed)
CSN: [id number]     Arrival date & time 10/08/15  1215 History   First MD Initiated Contact with Patient 10/08/15 1245     Chief Complaint  Patient presents with  . Abdominal Pain    (Consider location/radiation/quality/duration/timing/severity/associated sxs/prior Treatment) The history is provided by the patient and medical records. No language interpreter was used.     Daniel [name] is a 50 y.o. male  who presents to the Emergency Department complaining of generalized abdominal pain that began this morning and is associated with nausea and vomiting. Patient states he has a wedding today and was "partying" last night and wedding events. He has hx of marijuana induced cyclic n/v - denies drug use to me today, however endorsed drug use to RN earlier today. Patient states he has been having this pain off and on over the last year. Symptoms today no different from prior incidents. Denies fever. Had BM this morning and passing gas but states that he does feel constipated. No blood in stool. Denies any other associated symptoms. No alleviating or aggravating factors noted. No treatments prior to arrival.    Past Medical History  Diagnosis Date  . Rectal abscess   . Internal hemorrhoids with other complication   . Chronic lower back pain   . Depression   . Anxiety    Past Surgical History  Procedure Laterality Date  . Incision and drainage perirectal abscess  10/2010  . Hernia repair     History reviewed. No pertinent family history. Social History  Substance Use Topics  . Smoking status: Current Every Day Smoker -- 0.75 packs/day for 28 years    Types: Cigarettes  . Smokeless tobacco: None  . Alcohol Use: Yes     Comment: 11/11/2012 "probably 2, 40oz beers/month"    Review of Systems  Constitutional: Negative for fever and chills.  HENT: Negative for congestion.   Eyes: Negative for visual disturbance.  Respiratory: Negative for cough and shortness of breath.   Cardiovascular:  Negative.   Gastrointestinal: Positive for nausea, vomiting, abdominal pain and constipation. Negative for diarrhea and blood in stool.  Genitourinary: Negative for dysuria.  Musculoskeletal: Negative for back pain and neck pain.  Skin: Negative for rash.  Neurological: Negative for headaches.      Allergies  Review of patient's allergies indicates no known allergies.  Home Medications   Prior to Admission medications   Medication Sig Start Date End Date Taking? Authorizing Provider  loratadine (CLARITIN) 10 MG tablet Take 10 mg by mouth daily as needed for allergies.   Yes Historical Provider, MD  ondansetron (ZOFRAN ODT) 4 MG disintegrating tablet Take 1 tablet (4 mg total) by mouth every 8 (eight) hours as needed for nausea or vomiting. 10/08/15   Chase PicketJaime Pilcher Ward, PA-C   BP 186/77 mmHg  Pulse 55  Temp(Src) 98.2 F (36.8 C) (Oral)  Resp 28  SpO2 97% Physical Exam  Constitutional: He is oriented to person, place, and time. He appears well-developed and well-nourished.  NAD  HENT:  Head: Normocephalic and atraumatic.  Neck: Normal range of motion. Neck supple.  Cardiovascular: Normal rate, regular rhythm and normal heart sounds.   Pulmonary/Chest: Effort normal and breath sounds normal. No respiratory distress. He has no wheezes. He has no rales. He exhibits no tenderness.  Abdominal: Soft. Bowel sounds are normal. He exhibits no distension. There is tenderness (Diffuse, generalized).  Musculoskeletal: He exhibits no edema.  Lymphadenopathy:    He has no cervical adenopathy.  Neurological: He  is alert and oriented to person, place, and time.  Nursing note and vitals reviewed.   ED Course  Procedures (including critical care time) Labs Review Labs Reviewed  URINE RAPID DRUG SCREEN, HOSP PERFORMED - Abnormal; Notable for the following:    Cocaine POSITIVE (*)    Tetrahydrocannabinol POSITIVE (*)    All other components within normal limits  URINALYSIS, ROUTINE W  REFLEX MICROSCOPIC (NOT [facility]) - Abnormal; Notable for the following:    APPearance CLOUDY (*)    Hgb urine dipstick SMALL (*)    Ketones, ur 40 (*)    Protein, ur 100 (*)    Leukocytes, UA SMALL (*)    All other components within normal limits  COMPREHENSIVE METABOLIC PANEL - Abnormal; Notable for the following:    Total Protein 9.1 (*)    All other components within normal limits  CBC WITH DIFFERENTIAL/PLATELET - Abnormal; Notable for the following:    WBC 12.8 (*)    Neutro Abs 11.3 (*)    All other components within normal limits  URINE MICROSCOPIC-ADD ON - Abnormal; Notable for the following:    Bacteria, UA FEW (*)    All other components within normal limits  LIPASE, BLOOD    Imaging Review Dg Abd Acute W/chest  10/08/2015  CLINICAL DATA:  Abdominal pain with vomiting and constipation. EXAM: DG ABDOMEN ACUTE W/ 1V CHEST COMPARISON:  05/26/2015 and abdominal CT 11/11/2012 FINDINGS: Lungs are clear. Normal appearance of the heart and mediastinum. The trachea is midline. Negative for free air. Stable 1.5 cm calcification in the right abdomen and compatible with a right kidney stone. Nonobstructive bowel gas pattern. Small amount of stool in the abdomen and pelvis. IMPRESSION: No acute abnormality in the chest or abdomen. Chronic right kidney stone. Electronically Signed   By: Richarda OverlieAdam  Henn M.D.   On: 10/08/2015 14:42   I have personally reviewed and evaluated these images and lab results as part of my medical decision-making.   EKG Interpretation None      MDM   Final diagnoses:  Abdominal pain  Non-intractable vomiting with nausea, vomiting of unspecified type   Daniel [name] presents to ED for abdominal pain, nausea, vomiting. Hx of same. On exam, nonsurgical abdomen. Likely marijuana induced nausea vomiting.   2:04 PM - Per nursing staff patient is refusing abdominal x-ray and labwork at this time. Meds were given.   Abdominal x-ray was obtained which is reassuring with  a nonobstructive bowel gas pattern. Lab work reviewed.  3:38 PM - A shot reevaluated and symptoms have improved. Patient states he is ready for discharge to home. PCP follow up strongly recommended. Return precautions discussed. All questions answered.     [facility]ime Pilcher Ward, PA-C 10/08/15 1540  Nelva Nayobert Beaton, MD 10/09/15 [phone]

## 2015-10-08 NOTE — ED Notes (Signed)
Pt refused transport to xray at this time.

## 2015-10-08 NOTE — ED Notes (Signed)
Pt drank cup of water no issues.

## 2015-10-08 NOTE — Discharge Instructions (Signed)
Follow-up with your primary care physician in regards to today's visit.  Please seek immediate care if you develop any of the following symptoms: The pain does not go away.  You have a fever.  You keep throwing up (vomiting).  You pass bloody or black tarry stools.  There is bright red blood in the stool.  The constipation stays for more than 4 days There is rectal pain.  You do not seem to be getting better.  You have any questions or concerns.

## 2015-10-08 NOTE — ED Notes (Addendum)
Pt has eyes closed and is diaphoretic. Diffuse belly pain; occasionally drinks; was drinking last night for wedding party. Poor historian. Only "smokes reefer". States pain comes and goes since about a year. Has been seen for this before.

## 2015-10-08 NOTE — ED Notes (Signed)
Attempted IV by 2 RN's

## 2015-10-08 NOTE — ED Notes (Signed)
Phlebotomy was in room attempting to draw blood pt refused and family started to yell at phlebotomist saying " no your not going to get blood, if you were in this much pain would you want to get your blood drawn" family member got louder yelling at phlebotomist " I asked you a question".

## 2015-11-25 ENCOUNTER — Encounter (HOSPITAL_COMMUNITY): Payer: Self-pay | Admitting: *Deleted

## 2015-11-25 ENCOUNTER — Emergency Department (HOSPITAL_COMMUNITY): Payer: Worker's Compensation

## 2015-11-25 ENCOUNTER — Emergency Department (HOSPITAL_COMMUNITY)
Admission: EM | Admit: 2015-11-25 | Discharge: 2015-11-25 | Disposition: A | Discharge status: Home / Self Care | Payer: Worker's Compensation | Attending: Emergency Medicine | Admitting: Emergency Medicine

## 2015-11-25 DIAGNOSIS — Y990 Civilian activity done for income or pay: Secondary | ICD-10-CM | POA: Insufficient documentation

## 2015-11-25 DIAGNOSIS — Y9302 Activity, running: Secondary | ICD-10-CM | POA: Diagnosis not present

## 2015-11-25 DIAGNOSIS — S81812A Laceration without foreign body, left lower leg, initial encounter: Secondary | ICD-10-CM

## 2015-11-25 DIAGNOSIS — Z23 Encounter for immunization: Secondary | ICD-10-CM | POA: Diagnosis not present

## 2015-11-25 DIAGNOSIS — Y929 Unspecified place or not applicable: Secondary | ICD-10-CM | POA: Insufficient documentation

## 2015-11-25 DIAGNOSIS — W458XXA Other foreign body or object entering through skin, initial encounter: Secondary | ICD-10-CM | POA: Diagnosis not present

## 2015-11-25 DIAGNOSIS — F17210 Nicotine dependence, cigarettes, uncomplicated: Secondary | ICD-10-CM | POA: Insufficient documentation

## 2015-11-25 MED ORDER — HYDROCODONE-ACETAMINOPHEN 5-325 MG PO TABS: 1.0000 | ORAL_TABLET | Freq: Four times a day (QID) | ORAL | 0 refills | Status: DC | PRN

## 2015-11-25 MED ORDER — NAPROXEN 500 MG PO TABS: 500.0000 mg | ORAL_TABLET | Freq: Two times a day (BID) | ORAL | 0 refills | Status: DC

## 2015-11-25 MED ORDER — TETANUS-DIPHTH-ACELL PERTUSSIS 5-2.5-18.5 LF-MCG/0.5 IM SUSP
0.5000 mL | Freq: Once | INTRAMUSCULAR | Status: AC
Start: 2015-11-25 — End: 2015-11-25
  Administered 2015-11-25: 0.5 mL via INTRAMUSCULAR
  Filled 2015-11-25: qty 0.5

## 2015-11-25 MED ORDER — LIDOCAINE HCL 2 % IJ SOLN
5.0000 mL | Freq: Once | INTRAMUSCULAR | Status: AC
  Administered 2015-11-25: 100 mg
  Filled 2015-11-25: qty 20

## 2015-11-25 NOTE — ED Notes (Signed)
See providers assessment.  

## 2015-11-25 NOTE — Discharge Instructions (Signed)
Do not drive while taking the narcotic pain medication as it will make you sleepy. If you develop any signs of infection return immediately.  You will need to follow up in 10 days for staple removal. You can go to Urgent Care or return here.

## 2015-11-25 NOTE — ED Provider Notes (Signed)
[facility]-EMERGENCY DEPT Provider Note   CSN: [id number] Arrival date & time: 11/25/15  1456  First Provider Contact:   First MD Initiated Contact with Patient 11/25/15 1604      By signing my name below, I, Placido Sou, attest that this documentation has been prepared under the direction and in the presence of Kerrie Buffalo, NP. Electronically Signed: Placido Sou, ED Scribe. 11/25/15. 4:20 PM.    History   Chief Complaint Chief Complaint  Patient presents with  . Laceration    HPI  HPI Comments: Daniel [name] is a 50 y.o. male who presents to the Emergency Department complaining of a moderate laceration with no active bleeding to his left lateral calf which occurred at 2:45 PM this afternoon. 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 He reports associated 10/10 pain across the region. Pt denies his tetanus vaccination is UTD. He denies any other associated symptoms at this time.    The history is provided by the patient. No language interpreter was used.  Laceration   The incident occurred 1 to 2 hours ago. The laceration is located on the left leg. Size: 7.5 cm. The laceration mechanism was a a metal edge. The pain is at a severity of 10/10. The pain is moderate. The pain has been constant since onset. It is unknown if a foreign body is present. His tetanus status is out of date.   Past Medical History:  Diagnosis Date  . Anxiety   . Chronic lower back pain   . Depression   . Internal hemorrhoids with other complication   . Rectal abscess     Patient Active Problem List   Diagnosis Date Noted  . Lung nodules 11/11/2012  . CAP (community acquired pneumonia) 11/11/2012  . Leukocytosis 11/11/2012  . Diarrhea 11/11/2012  . Abscess, perirectal 11/17/2010  . Hemorrhoids, internal, with bleeding 11/17/2010    Past Surgical History:    Procedure Laterality Date  . HERNIA REPAIR    . INCISION AND DRAINAGE PERIRECTAL ABSCESS  10/2010       Home Medications    Prior to Admission medications   Medication Sig Start Date End Date Taking? Authorizing Provider  HYDROcodone-acetaminophen (NORCO) 5-325 MG tablet Take 1 tablet by mouth every 6 (six) hours as needed. 11/25/15   Colette Dicamillo Orlene Och, NP  loratadine (CLARITIN) 10 MG tablet Take 10 mg by mouth daily as needed for allergies.    Historical Provider, MD  naproxen (NAPROSYN) 500 MG tablet Take 1 tablet (500 mg total) by mouth 2 (two) times daily. 11/25/15   Lenvil Swaim Orlene Och, NP  ondansetron (ZOFRAN ODT) 4 MG disintegrating tablet Take 1 tablet (4 mg total) by mouth every 8 (eight) hours as needed for nausea or vomiting. 10/08/15   Chase Picket Ward, PA-C    Family History History reviewed. No pertinent family history.  Social History Social History  Substance Use Topics  . Smoking status: Current Every Day Smoker    Packs/day: 0.75    Years: 28.00    Types: Cigarettes  . Smokeless tobacco: Former Neurosurgeon  . Alcohol use Yes     Comment: 11/11/2012 "probably 2, 40oz beers/month"     Allergies   Review of patient's allergies indicates no known allergies.   Review of Systems Review of Systems  Constitutional: Negative for chills and fever.  Musculoskeletal: Positive for  myalgias.  Skin: Positive for wound. Negative for color change.  All other systems reviewed and are negative.  Physical Exam Updated Vital Signs BP 127/83 (BP Location: Left Arm)   Pulse 83   Temp 99.9 F (37.7 C) (Oral)   Resp 18   Ht  (1.753 m)   Wt 68 kg   SpO2 97%   BMI 22.15 kg/m   Physical Exam  Constitutional: He is oriented to person, place, and time. He appears well-developed and well-nourished.  HENT:  Head: Normocephalic and atraumatic.  Eyes: EOM are normal.  Neck: Normal range of motion.  Cardiovascular: Normal rate and intact distal pulses.   Left leg: Pt pulse 2+   Pulmonary/Chest: Effort normal. No respiratory distress.  Abdominal: Soft.  Musculoskeletal: Normal range of motion. He exhibits tenderness.  Left leg: Plantar flexion and dorsiflexion w/o difficulty; strength nml  Neurological: He is alert and oriented to person, place, and time. He has normal strength.  Skin: Skin is warm and dry. Laceration noted.  7.5 cm laceration to the left lateral calf which spans to the muscle but not through the muscle.  Psychiatric: He has a normal mood and affect.  Nursing note and vitals reviewed.  ED Treatments / Results  Labs (all labs ordered are listed, but only abnormal results are displayed) Labs Reviewed - No data to display   Radiology Dg Tibia/fibula Left  Result Date: 11/25/2015 CLINICAL DATA:  Pt was at work when another employee accidentally hit leg with weed eater; laceration to posterior left leg proximal to ankle on lateral side EXAM: LEFT TIBIA AND FIBULA - 2 VIEW COMPARISON:  None. FINDINGS: Laceration is identified along the posterior-lateral aspect of the lower leg not associated with radiopaque foreign body or fracture. IMPRESSION: Laceration.  No acute fracture. Electronically Signed   By: Norva Pavlov M.D.   On: 11/25/2015 17:22    Procedures .Marland KitchenLaceration Repair Date/Time: 11/25/2015 4:28 PM Performed by: Janne Napoleon Authorized by: Marily Memos   Consent:    Consent obtained:  Verbal   Consent given by:  Patient   Risks discussed:  Infection, pain, retained foreign body, poor cosmetic result and poor wound healing   Alternatives discussed:  No treatment and delayed treatment Anesthesia (see MAR for exact dosages):    Anesthesia method:  Local infiltration   Local anesthetic:  Lidocaine 2% w/o epi Laceration details:    Location:  Leg   Leg location:  L lower leg   Length (cm):  7.5 Repair type:    Repair type:  Complex Pre-procedure details:    Preparation:  Patient was prepped and draped in usual sterile fashion  and imaging obtained to evaluate for foreign bodies Exploration:    Wound exploration: wound explored through full range of motion and entire depth of wound probed and visualized     Contaminated: no   Treatment:    Wound cleansed with: betadine scrub brush.   Amount of cleaning:  Extensive   Irrigation solution:  Sterile saline   Irrigation volume:  1000 ccs   Irrigation method:  Syringe   Visualized foreign bodies/material removed: yes     Debridement:  Minimal   Scar revision: no   Subcutaneous repair:    Suture size:  5-0   Suture material:  Vicryl   Suture technique:  Simple interrupted   Number of sutures:  3 Skin repair:    Repair method:  Staples   Number of staples:  7 Approximation:  Approximation:  Close Post-procedure details:    Dressing:  Antibiotic ointment and sterile dressing   Patient tolerance of procedure:  Tolerated well, no immediate complications Comments:     Tetanus updated, dressing to wound and crutches.         DIAGNOSTIC STUDIES: Oxygen Saturation is 97% on RA, normal by my interpretation.    COORDINATION OF CARE: 4:10 PM Discussed next steps with pt. Pt verbalized understanding and is agreeable with the plan.    Medications Ordered in ED Medications  lidocaine (XYLOCAINE) 2 % (with pres) injection 100 mg (100 mg Infiltration Given 11/25/15 1816)  Tdap (BOOSTRIX) injection 0.5 mL (0.5 mLs Intramuscular Given 11/25/15 1813)     Initial Impression / Assessment and Plan / ED Course  I have reviewed the triage vital signs and the nursing notes.   Clinical Course    Tetanus updated in the ED. Laceration occurred < 12 hours prior to repair. Discussed laceration care with pt and answered questions. Pt to f-u for suture removal in 7-10 days and wound check sooner should there be signs of dehiscence or infection. Pt is hemodynamically stable with no complaints prior to dc. Pt evaluated by Dr. Clayborne DanaMesner and wound care was discussed. No abx at  this time but signs and symptoms of infection were discussed. Pt understands to return immediately with any signs or symptoms of infection or worsening symptoms.   I personally performed the services described in this documentation, which was scribed in my presence. The recorded information has been reviewed and is accurate.   Final Clinical Impressions(s) / ED Diagnoses  50 y.o. male with laceration to the left lateral calf s/p injury while at work stable for d/c without focal neuro deficits and no fracture or foreign body noted on x-ray. Patient did have small pieces of dirt that were removed with irrigation of the wound prior to laceration repair.   Final diagnoses:  Leg laceration, left, initial encounter    New Prescriptions Discharge Medication List as of 11/25/2015  5:58 PM    START taking these medications   Details  HYDROcodone-acetaminophen (NORCO) 5-325 MG tablet Take 1 tablet by mouth every 6 (six) hours as needed., Starting Fri 11/25/2015, Print    naproxen (NAPROSYN) 500 MG tablet Take 1 tablet (500 mg total) by mouth 2 (two) times daily., Starting Fri 11/25/2015, Print         [place] M Rakeisha Nyce, NP 11/25/15 1843    Marily MemosJason Mesner, MD 11/25/15 2035

## 2015-11-25 NOTE — ED Triage Notes (Signed)
Pt was struck by a weed eater blade on his left lower leg at work today. Pt presents with a 6 cm. Bleeding controlled. Last tetanus shot >10 years ago.

## 2015-11-25 NOTE — ED Provider Notes (Signed)
Medical screening examination/treatment/procedure(s) were conducted as a shared visit with non-physician practitioner(s) and myself.  I personally evaluated the patient during the encounter.  50 year old male who got his left leg cut by a blade on a weed eater. On my examination patient has normal DP pulse, normal motor function and normal sensory of his left foot. The wound is approximately 6-7 cm long with about a 3/4 cm deep. Some necrotic skin on the edges. No obvious foreign bodies. Plan to ensure UTD tetanus, clean, significant irrigation. X-ray and suture/staple as appropriate.   Marily MemosJason Bryam Taborda, MD 11/25/15 2035

## 2015-12-05 ENCOUNTER — Emergency Department (HOSPITAL_COMMUNITY)
Admission: EM | Admit: 2015-12-05 | Discharge: 2015-12-05 | Disposition: A | Discharge status: Home / Self Care | Payer: Worker's Compensation | Attending: Emergency Medicine | Admitting: Emergency Medicine

## 2015-12-05 ENCOUNTER — Encounter (HOSPITAL_COMMUNITY): Payer: Self-pay | Admitting: Nurse Practitioner

## 2015-12-05 DIAGNOSIS — Z4802 Encounter for removal of sutures: Secondary | ICD-10-CM | POA: Insufficient documentation

## 2015-12-05 DIAGNOSIS — F17210 Nicotine dependence, cigarettes, uncomplicated: Secondary | ICD-10-CM | POA: Insufficient documentation

## 2015-12-05 NOTE — ED Notes (Signed)
Declined W/C at D/C and was escorted to lobby by RN. 

## 2015-12-05 NOTE — ED Provider Notes (Signed)
[facility]-EMERGENCY DEPT Provider Note   CSN: [id number] Arrival date & time: 12/05/15  1535  By signing my name below, I, Phillis HaggisGabriella Gaje, attest that this documentation has been prepared under the direction and in the presence of Felicie Mornavid Riyan Gavina, NP-C. Electronically Signed: Phillis HaggisGabriella Gaje, ED Scribe. 12/05/15. 5:09 PM.  History   Chief Complaint Chief Complaint  Patient presents with  . Suture / Staple Removal   The history is provided by the patient. No language interpreter was used.  Suture / Staple Removal  This is a new problem. The current episode started more than 1 week ago. The problem has not changed since onset.He has tried nothing for the symptoms.  HPI Comments: Daniel [name] is a 50 y.o. male who presents to the Emergency Department requesting removal of staples. Pt was seen on 11/25/15 following a laceration to the lateral left calf by a weed eater. He had 7 staples placed. He denies fever, chills, nausea, or vomiting.   Past Medical History:  Diagnosis Date  . Anxiety   . Chronic lower back pain   . Depression   . Internal hemorrhoids with other complication   . Rectal abscess     Patient Active Problem List   Diagnosis Date Noted  . Lung nodules 11/11/2012  . CAP (community acquired pneumonia) 11/11/2012  . Leukocytosis 11/11/2012  . Diarrhea 11/11/2012  . Abscess, perirectal 11/17/2010  . Hemorrhoids, internal, with bleeding 11/17/2010    Past Surgical History:  Procedure Laterality Date  . HERNIA REPAIR    . INCISION AND DRAINAGE PERIRECTAL ABSCESS  10/2010       Home Medications    Prior to Admission medications   Medication Sig Start Date End Date Taking? Authorizing Provider  HYDROcodone-acetaminophen (NORCO) 5-325 MG tablet Take 1 tablet by mouth every 6 (six) hours as needed. 11/25/15   Hope Orlene OchM Neese, NP  loratadine (CLARITIN) 10 MG tablet Take 10 mg by mouth daily as needed for allergies.    Historical Provider, MD  naproxen (NAPROSYN) 500 MG tablet  Take 1 tablet (500 mg total) by mouth 2 (two) times daily. 11/25/15   Hope Orlene OchM Neese, NP  ondansetron (ZOFRAN ODT) 4 MG disintegrating tablet Take 1 tablet (4 mg total) by mouth every 8 (eight) hours as needed for nausea or vomiting. 10/08/15   Chase PicketJaime Pilcher Ward, PA-C    Family History History reviewed. No pertinent family history.  Social History Social History  Substance Use Topics  . Smoking status: Current Every Day Smoker    Packs/day: 0.75    Years: 28.00    Types: Cigarettes  . Smokeless tobacco: Former NeurosurgeonUser  . Alcohol use Yes     Comment: 11/11/2012 "probably 2, 40oz beers/month"     Allergies   Review of patient's allergies indicates no known allergies.   Review of Systems Review of Systems  Constitutional: Negative for chills and fever.  Gastrointestinal: Negative for nausea and vomiting.  Skin: Positive for wound. Negative for color change.  All other systems reviewed and are negative.    Physical Exam Updated Vital Signs BP 113/64   Pulse 86   Temp 98.8 F (37.1 C) (Oral)   Resp 18   SpO2 98%   Physical Exam  Constitutional: He is oriented to person, place, and time. He appears well-developed and well-nourished.  HENT:  Head: Normocephalic and atraumatic.  Eyes: Conjunctivae and EOM are normal. Pupils are equal, round, and reactive to light.  Neck: Normal range of motion. Neck supple.  Musculoskeletal: Normal range of motion.  Neurological: He is alert and oriented to person, place, and time.  Skin: Skin is warm and dry.  Well healing wound to the left calf; 7 staples removed; no draining or other signs of infection.   Psychiatric: He has a normal mood and affect. His behavior is normal.  Nursing note and vitals reviewed.    ED Treatments / Results  DIAGNOSTIC STUDIES: Oxygen Saturation is 98% on RA, normal by my interpretation.    COORDINATION OF CARE: 5:07 PM-Discussed treatment plan which includes staple removal with pt at bedside and pt agreed  to plan.    Labs (all labs ordered are listed, but only abnormal results are displayed) Labs Reviewed - No data to display  EKG  EKG Interpretation None       Radiology No results found.  Procedures Procedures (including critical care time)  Medications Ordered in ED Medications - No data to display   Initial Impression / Assessment and Plan / ED Course  I have reviewed the triage vital signs and the nursing notes.  Pertinent labs & imaging results that were available during my care of the patient were reviewed by me and considered in my medical decision making (see chart for details).  Clinical Course    Well-healing wound  Final Clinical Impressions(s) / ED Diagnoses   Staple removal   Pt to ER for staple removal and wound check as above. Procedure tolerated well. Vitals normal, no signs of infection. Scar minimization & return precautions given at dc.   Final diagnoses:  None  I personally performed the services described in this documentation, which was scribed in my presence. The recorded information has been reviewed and is accurate.   New Prescriptions New Prescriptions   No medications on file     Felicie MornDavid Terel Bann, NP 12/05/15 2358    Nelva Nayobert Beaton, MD 12/07/15 1345

## 2015-12-05 NOTE — ED Triage Notes (Signed)
He is here for staple removal to left outer calf. Placed here 10 days ago. He denies any complaints, signs of infection.

## 2015-12-14 ENCOUNTER — Encounter (HOSPITAL_COMMUNITY): Payer: Self-pay | Admitting: Emergency Medicine

## 2015-12-14 ENCOUNTER — Emergency Department (HOSPITAL_COMMUNITY)
Admission: EM | Admit: 2015-12-14 | Discharge: 2015-12-14 | Disposition: A | Discharge status: Home / Self Care | Payer: Self-pay | Attending: Emergency Medicine | Admitting: Emergency Medicine

## 2015-12-14 DIAGNOSIS — Z79899 Other long term (current) drug therapy: Secondary | ICD-10-CM | POA: Insufficient documentation

## 2015-12-14 DIAGNOSIS — R197 Diarrhea, unspecified: Secondary | ICD-10-CM

## 2015-12-14 DIAGNOSIS — R1084 Generalized abdominal pain: Secondary | ICD-10-CM

## 2015-12-14 DIAGNOSIS — F17210 Nicotine dependence, cigarettes, uncomplicated: Secondary | ICD-10-CM | POA: Insufficient documentation

## 2015-12-14 DIAGNOSIS — R112 Nausea with vomiting, unspecified: Secondary | ICD-10-CM | POA: Insufficient documentation

## 2015-12-14 LAB — URINALYSIS, ROUTINE W REFLEX MICROSCOPIC
BILIRUBIN URINE: NEGATIVE
Glucose, UA: NEGATIVE mg/dL
Nitrite: NEGATIVE
PROTEIN: 100 mg/dL — AB
Specific Gravity, Urine: 1.022 (ref 1.005–1.030)
pH: 8 (ref 5.0–8.0)

## 2015-12-14 LAB — COMPREHENSIVE METABOLIC PANEL
ALK PHOS: 72 U/L (ref 38–126)
ALT: 19 U/L (ref 17–63)
ANION GAP: 15 (ref 5–15)
AST: 31 U/L (ref 15–41)
Albumin: 4.3 g/dL (ref 3.5–5.0)
BUN: 10 mg/dL (ref 6–20)
CALCIUM: 10.1 mg/dL (ref 8.9–10.3)
CO2: 25 mmol/L (ref 22–32)
CREATININE: 1 mg/dL (ref 0.61–1.24)
Chloride: 105 mmol/L (ref 101–111)
GFR calc non Af Amer: >60 mL/min (ref >60)
GLUCOSE: 145 mg/dL — AB (ref 65–99)
Potassium: 3.7 mmol/L (ref 3.5–5.1)
SODIUM: 145 mmol/L (ref 135–145)
Total Bilirubin: 0.9 mg/dL (ref 0.3–1.2)
Total Protein: 8 g/dL (ref 6.5–8.1)

## 2015-12-14 LAB — RAPID URINE DRUG SCREEN, HOSP PERFORMED
Amphetamines: NOT DETECTED
Barbiturates: NOT DETECTED
Benzodiazepines: NOT DETECTED
Cocaine: POSITIVE — AB
OPIATES: NOT DETECTED
TETRAHYDROCANNABINOL: POSITIVE — AB

## 2015-12-14 LAB — CBC
HCT: 45.1 % (ref 39.0–52.0)
Hemoglobin: 14.9 g/dL (ref 13.0–17.0)
MCH: 31.3 pg (ref 26.0–34.0)
MCHC: 33 g/dL (ref 30.0–36.0)
MCV: 94.7 fL (ref 78.0–100.0)
PLATELETS: 259 10*3/uL (ref 150–400)
RBC: 4.76 MIL/uL (ref 4.22–5.81)
RDW: 13.7 % (ref 11.5–15.5)
WBC: 9.9 10*3/uL (ref 4.0–10.5)

## 2015-12-14 LAB — URINE MICROSCOPIC-ADD ON

## 2015-12-14 LAB — LIPASE, BLOOD: Lipase: 21 U/L (ref 11–51)

## 2015-12-14 MED ORDER — ONDANSETRON HCL 4 MG/2ML IJ SOLN
4.0000 mg | Freq: Once | INTRAMUSCULAR | Status: AC | PRN
  Administered 2015-12-14: 4 mg via INTRAVENOUS
  Filled 2015-12-14: qty 2

## 2015-12-14 MED ORDER — HALOPERIDOL LACTATE 5 MG/ML IJ SOLN
2.0000 mg | Freq: Once | INTRAMUSCULAR | Status: AC
  Administered 2015-12-14: 2 mg via INTRAVENOUS
  Filled 2015-12-14: qty 1

## 2015-12-14 MED ORDER — SODIUM CHLORIDE 0.9 % IV BOLUS (SEPSIS)
500.0000 mL | Freq: Once | INTRAVENOUS | Status: AC
  Administered 2015-12-14: 500 mL via INTRAVENOUS

## 2015-12-14 MED ORDER — ONDANSETRON 4 MG PO TBDP: 4.0000 mg | ORAL_TABLET | Freq: Three times a day (TID) | ORAL | 0 refills | Status: DC | PRN

## 2015-12-14 NOTE — ED Notes (Signed)
Pt vomiting after cranberry juice PO challenge. EDP aware.

## 2015-12-14 NOTE — ED Triage Notes (Signed)
Pt states abd pain started yesterday at approx 7pm. Pt states that pain started after he ate a burger. Pt c/o N/V/D since then.

## 2015-12-14 NOTE — ED Notes (Signed)
Pt given cranberry juice PO challenge

## 2015-12-14 NOTE — ED Provider Notes (Signed)
[facility]-EMER[facility]Y DEPT Provider Note   CSN: [id number] Arrival date & time: 12/14/15  [id number]     History   Chief Complaint Chief Complaint  Patient presents with  . Abdominal Pain    HPI Daniel [name] is a 50 y.o. male.  Patient with a history of recurrent abdominal pain associated with nausea, vomiting and diarrhea presents with symptoms similar to previous. No fever. No hematemesis, melena or hematochezia. He reports a history of ulcers "in the bowel". No urinary symptoms. He is not on regular medications and did not attempt anything to relieve symptoms.    The history is provided by the patient. No language interpreter was used.  Abdominal Pain   This is a recurrent problem. Associated symptoms include diarrhea, nausea and vomiting. Pertinent negatives include fever.    Past Medical History:  Diagnosis Date  . Anxiety   . Chronic lower back pain   . Depression   . Internal hemorrhoids with other complication   . Rectal abscess     Patient Active Problem List   Diagnosis Date Noted  . Lung nodules 11/11/2012  . CAP (community acquired pneumonia) 11/11/2012  . Leukocytosis 11/11/2012  . Diarrhea 11/11/2012  . Abscess, perirectal 11/17/2010  . Hemorrhoids, internal, with bleeding 11/17/2010    Past Surgical History:  Procedure Laterality Date  . HERNIA REPAIR    . INCISION AND DRAINAGE PERIRECTAL ABSCESS  10/2010       Home Medications    Prior to Admission medications   Medication Sig Start Date End Date Taking? Authorizing Provider  HYDROcodone-acetaminophen (NORCO) 5-325 MG tablet Take 1 tablet by mouth every 6 (six) hours as needed. Patient not taking: Reported on 12/14/2015 11/25/15   Janne NapoleonHope M Neese, NP  naproxen (NAPROSYN) 500 MG tablet Take 1 tablet (500 mg total) by mouth 2 (two) times daily. Patient not taking: Reported on 12/14/2015 11/25/15   Janne NapoleonHope M Neese, NP  ondansetron (ZOFRAN ODT) 4 MG disintegrating tablet Take 1 tablet (4 mg total) by mouth every 8  (eight) hours as needed for nausea or vomiting. Patient not taking: Reported on 12/14/2015 10/08/15   Chase PicketJaime Pilcher Ward, PA-C    Family History History reviewed. No pertinent family history.  Social History Social History  Substance Use Topics  . Smoking status: Current Every Day Smoker    Packs/day: 0.75    Years: 28.00    Types: Cigarettes  . Smokeless tobacco: Former NeurosurgeonUser  . Alcohol use Yes     Comment: 11/11/2012 "probably 2, 40oz beers/month"     Allergies   Review of patient's allergies indicates no known allergies.   Review of Systems Review of Systems  Constitutional: Negative for chills and fever.  HENT: Negative.   Respiratory: Negative.   Cardiovascular: Negative.   Gastrointestinal: Positive for abdominal pain, diarrhea, nausea and vomiting. Negative for blood in stool.  Musculoskeletal: Negative.   Skin: Negative.   Neurological: Negative.      Physical Exam Updated Vital Signs BP 148/73   Pulse 60   Temp 97.6 F (36.4 C) (Oral)   Resp (!) 32   Ht 5\' 9"  (1.753 m)   Wt 68 kg   SpO2 100%   BMI 22.15 kg/m   Physical Exam  Constitutional: He appears well-developed and well-nourished. No distress.  HENT:  Head: Normocephalic.  Neck: Normal range of motion. Neck supple.  Cardiovascular: Normal rate and regular rhythm.   Pulmonary/Chest: Effort normal and breath sounds normal.  Abdominal: Soft. There is tenderness (diffuse  tenderness). There is no rebound and no guarding.  Musculoskeletal: Normal range of motion.  Neurological: He is alert. No cranial nerve deficit.  Skin: Skin is warm and dry. No rash noted.  Psychiatric: He has a normal mood and affect.     ED Treatments / Results  Labs (all labs ordered are listed, but only abnormal results are displayed) Labs Reviewed  COMPREHENSIVE METABOLIC PANEL - Abnormal; Notable for the following:       Result Value   Glucose, Bld 145 (*)    All other components within normal limits  LIPASE, BLOOD   CBC  URINALYSIS, ROUTINE W REFLEX MICROSCOPIC (NOT AT [facility])  URINE RAPID DRUG SCREEN, HOSP PERFORMED   Results for orders placed or performed during the hospital encounter of 12/14/15  Lipase, blood  Result Value Ref Range   Lipase 21 11 - 51 U/L  Comprehensive metabolic panel  Result Value Ref Range   Sodium 145 135 - 145 mmol/L   Potassium 3.7 3.5 - 5.1 mmol/L   Chloride 105 101 - 111 mmol/L   CO2 25 22 - 32 mmol/L   Glucose, Bld 145 (H) 65 - 99 mg/dL   BUN 10 6 - 20 mg/dL   Creatinine, Ser 1.61 0.61 - 1.24 mg/dL   Calcium 09.6 8.9 - 04.5 mg/dL   Total Protein 8.0 6.5 - 8.1 g/dL   Albumin 4.3 3.5 - 5.0 g/dL   AST 31 15 - 41 U/L   ALT 19 17 - 63 U/L   Alkaline Phosphatase 72 38 - 126 U/L   Total Bilirubin 0.9 0.3 - 1.2 mg/dL   GFR calc non Af Amer >60 >60 mL/min   GFR calc Af Amer >60 >60 mL/min   Anion gap 15 5 - 15  CBC  Result Value Ref Range   WBC 9.9 4.0 - 10.5 K/uL   RBC 4.76 4.22 - 5.81 MIL/uL   Hemoglobin 14.9 13.0 - 17.0 g/dL   HCT 40.9 81.1 - 91.4 %   MCV 94.7 78.0 - 100.0 fL   MCH 31.3 26.0 - 34.0 pg   MCHC 33.0 30.0 - 36.0 g/dL   RDW 78.2 95.6 - 21.3 %   Platelets 259 150 - 400 K/uL  Urine rapid drug screen (hosp performed)  Result Value Ref Range   Opiates NONE DETECTED NONE DETECTED   Cocaine POSITIVE (A) NONE DETECTED   Benzodiazepines NONE DETECTED NONE DETECTED   Amphetamines NONE DETECTED NONE DETECTED   Tetrahydrocannabinol POSITIVE (A) NONE DETECTED   Barbiturates NONE DETECTED NONE DETECTED    EKG  EKG Interpretation None       Radiology No results found.  Procedures Procedures (including critical care time)  Medications Ordered in ED Medications  ondansetron (ZOFRAN) injection 4 mg (4 mg Intravenous Given 12/14/15 0506)  sodium chloride 0.9 % bolus 500 mL (500 mLs Intravenous New Bag/Given 12/14/15 0512)  haloperidol lactate (HALDOL) injection 2 mg (2 mg Intravenous Given 12/14/15 0510)     Initial Impression / Assessment and  Plan / ED Course  I have reviewed the triage vital signs and the nursing notes.  Pertinent labs & imaging results that were available during my care of the patient were reviewed by me and considered in my medical decision making (see chart for details).  Clinical Course    Patient presents with recurrent abdominal pain, N, V, D. He is asymptomatic with 2 mg IV haldol and appears very comfortable. He is tolerating PO fluids. Discussed his repeatedly positive  UDS for marijuana and the possibility that his symptoms are related to daily use. VSS, labs reassuring. He can be discharged to home with referral for PCP.  Final Clinical Impressions(s) / ED Diagnoses   Final diagnoses:  None  1. N, V, D 2. Abdominal pain  New Prescriptions New Prescriptions   No medications on file     Elpidio Anis, Cordelia Poche 12/14/15 1610    Shon Baton, MD 12/17/15 [phone]

## 2015-12-15 ENCOUNTER — Encounter (HOSPITAL_COMMUNITY): Payer: Self-pay | Admitting: Emergency Medicine

## 2015-12-15 ENCOUNTER — Emergency Department (HOSPITAL_COMMUNITY)
Admission: EM | Admit: 2015-12-15 | Discharge: 2015-12-15 | Disposition: A | Discharge status: Home / Self Care | Payer: Self-pay | Attending: Emergency Medicine | Admitting: Emergency Medicine

## 2015-12-15 DIAGNOSIS — Z5321 Procedure and treatment not carried out due to patient leaving prior to being seen by health care provider: Secondary | ICD-10-CM | POA: Insufficient documentation

## 2015-12-15 DIAGNOSIS — F17210 Nicotine dependence, cigarettes, uncomplicated: Secondary | ICD-10-CM | POA: Insufficient documentation

## 2015-12-15 DIAGNOSIS — R109 Unspecified abdominal pain: Secondary | ICD-10-CM | POA: Insufficient documentation

## 2015-12-15 DIAGNOSIS — R112 Nausea with vomiting, unspecified: Secondary | ICD-10-CM | POA: Insufficient documentation

## 2015-12-15 LAB — CBC
HCT: 47.7 % (ref 39.0–52.0)
HEMOGLOBIN: 16 g/dL (ref 13.0–17.0)
MCH: 31.7 pg (ref 26.0–34.0)
MCHC: 33.5 g/dL (ref 30.0–36.0)
MCV: 94.6 fL (ref 78.0–100.0)
Platelets: 282 10*3/uL (ref 150–400)
RBC: 5.04 MIL/uL (ref 4.22–5.81)
RDW: 14 % (ref 11.5–15.5)
WBC: 17.4 10*3/uL — ABNORMAL HIGH (ref 4.0–10.5)

## 2015-12-15 LAB — URINALYSIS, ROUTINE W REFLEX MICROSCOPIC
GLUCOSE, UA: NEGATIVE mg/dL
Ketones, ur: 15 mg/dL — AB
Nitrite: NEGATIVE
PH: 7 (ref 5.0–8.0)
Protein, ur: 100 mg/dL — AB
SPECIFIC GRAVITY, URINE: 1.024 (ref 1.005–1.030)

## 2015-12-15 LAB — COMPREHENSIVE METABOLIC PANEL
ALBUMIN: 4.9 g/dL (ref 3.5–5.0)
ALK PHOS: 70 U/L (ref 38–126)
ALT: 25 U/L (ref 17–63)
ANION GAP: 16 — AB (ref 5–15)
AST: 34 U/L (ref 15–41)
BUN: 15 mg/dL (ref 6–20)
CALCIUM: 10.3 mg/dL (ref 8.9–10.3)
CHLORIDE: 102 mmol/L (ref 101–111)
CO2: 24 mmol/L (ref 22–32)
Creatinine, Ser: 1.09 mg/dL (ref 0.61–1.24)
GFR calc Af Amer: >60 mL/min (ref >60)
GFR calc non Af Amer: >60 mL/min (ref >60)
GLUCOSE: 85 mg/dL (ref 65–99)
Potassium: 3.6 mmol/L (ref 3.5–5.1)
SODIUM: 142 mmol/L (ref 135–145)
Total Bilirubin: 0.8 mg/dL (ref 0.3–1.2)
Total Protein: 9 g/dL — ABNORMAL HIGH (ref 6.5–8.1)

## 2015-12-15 LAB — URINE MICROSCOPIC-ADD ON

## 2015-12-15 LAB — LIPASE, BLOOD: Lipase: 24 U/L (ref 11–51)

## 2015-12-15 MED ORDER — ONDANSETRON 4 MG PO TBDP
4.0000 mg | ORAL_TABLET | Freq: Once | ORAL | Status: AC | PRN
  Administered 2015-12-15: 4 mg via ORAL

## 2015-12-15 MED ORDER — ONDANSETRON 4 MG PO TBDP
ORAL_TABLET | ORAL | Status: AC
  Filled 2015-12-15: qty 1

## 2015-12-15 NOTE — ED Notes (Signed)
Pt was called to have vitals reassessed, but pt did not answer. Will try calling again.

## 2015-12-15 NOTE — ED Notes (Signed)
Pt states he was seen here yesterday and discharged, still feels the same, unable to fill his prescriptions for nausea and pain. C/o abdominal pain and vomiting.

## 2015-12-15 NOTE — ED Notes (Signed)
Pt was called for vitals with no response.

## 2017-05-27 ENCOUNTER — Encounter: Payer: Self-pay | Admitting: Pediatric Intensive Care

## 2017-06-20 NOTE — Congregational Nurse Program (Signed)
Congregational Nurse Program Note  Date of Encounter: 05/27/2017  Past Medical History: Past Medical History:  Diagnosis Date  . Anxiety   . Chronic lower back pain   . Depression   . Internal hemorrhoids with other complication   . Rectal abscess     Encounter Details: CNP Questionnaire - 05/27/17 1000      Questionnaire   Patient Status  Not Applicable    Race  Black or African American    Location Patient Served At  [employer]    Insurance  Not Applicable    Uninsured  Uninsured (NEW 1x/quarter)    Food  No food insecurities    Housing/Utilities  No permanent housing    Transportation  No transportation needs    Interpersonal Safety  Yes, feel physically and emotionally safe where you currently live    Medication  No medication insecurities    Medical Provider  Yes    Referrals  Primary Care Provider/Clinic    ED Visit Averted  Not Applicable    Life-Saving Intervention Made  Not Applicable     Client states ongoing URI symptoms for 3 months. States that he is a daily smoker. [facility] but he has not there for quite some time. BBS- CTA. CN [facility]

## 2017-11-05 ENCOUNTER — Emergency Department (HOSPITAL_COMMUNITY)
Admission: EM | Admit: 2017-11-05 | Discharge: 2017-11-05 | Disposition: A | Discharge status: Home / Self Care | Payer: Self-pay | Attending: Emergency Medicine | Admitting: Emergency Medicine

## 2017-11-05 ENCOUNTER — Encounter (HOSPITAL_COMMUNITY): Payer: Self-pay | Admitting: *Deleted

## 2017-11-05 ENCOUNTER — Other Ambulatory Visit: Payer: Self-pay

## 2017-11-05 DIAGNOSIS — Z202 Contact with and (suspected) exposure to infections with a predominantly sexual mode of transmission: Secondary | ICD-10-CM | POA: Insufficient documentation

## 2017-11-05 DIAGNOSIS — F17210 Nicotine dependence, cigarettes, uncomplicated: Secondary | ICD-10-CM | POA: Insufficient documentation

## 2017-11-05 LAB — URINALYSIS, ROUTINE W REFLEX MICROSCOPIC
BILIRUBIN URINE: NEGATIVE
Bacteria, UA: NONE SEEN
GLUCOSE, UA: NEGATIVE mg/dL
HGB URINE DIPSTICK: NEGATIVE
KETONES UR: NEGATIVE mg/dL
NITRITE: NEGATIVE
PH: 6 (ref 5.0–8.0)
Protein, ur: NEGATIVE mg/dL
SPECIFIC GRAVITY, URINE: 1.02 (ref 1.005–1.030)

## 2017-11-05 MED ORDER — AZITHROMYCIN 250 MG PO TABS: 1000.0000 mg | ORAL_TABLET | Freq: Once | ORAL | Status: DC

## 2017-11-05 MED ORDER — CEFTRIAXONE SODIUM 250 MG IJ SOLR: 250.0000 mg | Freq: Once | INTRAMUSCULAR | Status: DC

## 2017-11-05 NOTE — ED Provider Notes (Signed)
[facility]-EMERGENCY DEPT Provider Note   CSN: [id number] Arrival date & time: 11/05/17  1919     History   Chief Complaint Chief Complaint  Patient presents with  . Exposure to STD    HPI Daniel [name] is a 52 y.o. male.  HPI   Pt 52 y/o male presenting to the ED requesting evaluation for STD testing.  Patient states that he had unprotected sex with someone who recently tested positive for gonorrhea.  He denies any symptoms.  Denies any penile discharge, dysuria, frequency, urgency.  No hematuria.  No abdominal pain, nausea or vomiting.  No fevers at home.  He declines HIV/RPR testing today.  Past Medical History:  Diagnosis Date  . Anxiety   . Chronic lower back pain   . Depression   . Internal hemorrhoids with other complication   . Rectal abscess     Patient Active Problem List   Diagnosis Date Noted  . Lung nodules 11/11/2012  . CAP (community acquired pneumonia) 11/11/2012  . Leukocytosis 11/11/2012  . Diarrhea 11/11/2012  . Abscess, perirectal 11/17/2010  . Hemorrhoids, internal, with bleeding 11/17/2010    Past Surgical History:  Procedure Laterality Date  . HERNIA REPAIR    . INCISION AND DRAINAGE PERIRECTAL ABSCESS  10/2010        Home Medications    Prior to Admission medications   Medication Sig Start Date End Date Taking? Authorizing Provider  HYDROcodone-acetaminophen (NORCO) 5-325 MG tablet Take 1 tablet by mouth every 6 (six) hours as needed. Patient not taking: Reported on 12/14/2015 11/25/15   Janne NapoleonNeese, Hope M, NP  naproxen (NAPROSYN) 500 MG tablet Take 1 tablet (500 mg total) by mouth 2 (two) times daily. Patient not taking: Reported on 12/14/2015 11/25/15   Janne NapoleonNeese, Hope M, NP  ondansetron (ZOFRAN ODT) 4 MG disintegrating tablet Take 1 tablet (4 mg total) by mouth every 8 (eight) hours as needed for nausea or vomiting. Patient not taking: Reported on 12/14/2015 10/08/15   Ward, Chase PicketJaime Pilcher, PA-C  ondansetron (ZOFRAN ODT) 4 MG  disintegrating tablet Take 1 tablet (4 mg total) by mouth every 8 (eight) hours as needed for nausea or vomiting. 12/14/15   Elpidio AnisUpstill, Shari, PA-C    Family History No family history on file.  Social History Social History   Tobacco Use  . Smoking status: Current Every Day Smoker    Packs/day: 0.75    Years: 28.00    Pack years: 21.00    Types: Cigarettes  . Smokeless tobacco: Former Engineer, waterUser  Substance Use Topics  . Alcohol use: Yes    Comment: 11/11/2012 "probably 2, 40oz beers/month"  . Drug use: Yes    Types: Marijuana    Comment: 11/11/2012 "last marijuana ~ 2 days ago"     Allergies   Patient has no known allergies.   Review of Systems Review of Systems  Constitutional: Negative for fever.  Gastrointestinal: Negative for abdominal pain, nausea and vomiting.  Genitourinary: Negative for dysuria, frequency, hematuria, scrotal swelling, testicular pain and urgency.       Exposure to STD  Musculoskeletal: Negative for back pain.  Skin: Negative for wound.     Physical Exam Updated Vital Signs BP (!) 142/88 (BP Location: Left Arm)   Pulse 66   Temp 98.5 F (36.9 C) (Oral)   Resp 16   SpO2 99%   Physical Exam  Constitutional: He is oriented to person, place, and time. He appears well-developed and well-nourished. No distress.  Eyes:  Conjunctivae are normal.  Cardiovascular: Normal rate.  Pulmonary/Chest: Effort normal.  Abdominal: Soft. There is no tenderness.  Genitourinary:  Genitourinary Comments: Chaperone present for genital exam. GC/chlamydia swab obtained. No TTP to the scrotum or testes. No lesions noted to the penis. No penile discharge noted.  Neurological: He is alert and oriented to person, place, and time.  Skin: Skin is warm and dry. Capillary refill takes less than 2 seconds.  Psychiatric: He has a normal mood and affect.   ED Treatments / Results  Labs (all labs ordered are listed, but only abnormal results are displayed) Labs Reviewed    URINALYSIS, ROUTINE W REFLEX MICROSCOPIC - Abnormal; Notable for the following components:      Result Value   Leukocytes, UA TRACE (*)    All other components within normal limits  URINE CULTURE  GC/CHLAMYDIA PROBE AMP ([place]) NOT AT [facility]    EKG None  Radiology No results found.  Procedures Procedures (including critical care time)  Medications Ordered in ED Medications  azithromycin (ZITHROMAX) tablet 1,000 mg (has no administration in time range)  cefTRIAXone (ROCEPHIN) injection 250 mg (has no administration in time range)     Initial Impression / Assessment and Plan / ED Course  I have reviewed the triage vital signs and the nursing notes.  Pertinent labs & imaging results that were available during my care of the patient were reviewed by me and considered in my medical decision making (see chart for details).      Final Clinical Impressions(s) / ED Diagnoses   Final diagnoses:  Exposure to STD   Patient is afebrile without abdominal tenderness, abdominal pain or painful bowel movements to indicate prostatitis.  No tenderness to palpation of the testes or epididymis to suggest orchitis or epididymitis.  STD cultures obtained including gonorrhea , chlamydia, and trichomonas . Pt declined testing for HIV/RPR. Patient to be discharged with instructions to follow up with PCP. Discussed importance of using protection when sexually active. Pt understands that they have GC/Chlamydia cultures pending and that they will need to inform all sexual partners if results return positive. UA positive for leukocytes, RBC and WBC. Suspect leukocytes secondary to STD rather than UTI as pt is asymptomatic, however culture was sent. Patient was offered prophylactic treatment with azithromycin and Rocephin and he declined stating that he would like to wait for results to be available before being treated.   ED Discharge Orders    None       Rayne Du 11/05/17  2146    Rolan Bucco, MD 11/05/17 2325

## 2017-11-05 NOTE — ED Triage Notes (Signed)
Pt arrives ambulatory to triage states his friend told her she was positive for STD, wants to get checked. Denies any symptoms.

## 2017-11-05 NOTE — Discharge Instructions (Addendum)
You have been tested for chlamydia and gonorrhea.  These results will be available in approximately 3 days and you will be contacted by the hospital if the results are positive. Avoid sexual contact until you are aware of the results, and please inform all sexual partners if you test positive for any of these diseases. ° °Please follow up with your primary care provider within 5-7 days for re-evaluation of your symptoms.Please return to the emergency department for any new or worsening symptoms. ° ° °

## 2017-11-05 NOTE — ED Notes (Signed)
Pt does not want abx, states "I'll come back if I'm positive."

## 2017-11-06 LAB — GC/CHLAMYDIA PROBE AMP (~~LOC~~) NOT AT ARMC
Chlamydia: NEGATIVE
Neisseria Gonorrhea: NEGATIVE

## 2017-11-07 LAB — URINE CULTURE: Culture: NO GROWTH

## 2018-11-11 ENCOUNTER — Emergency Department (HOSPITAL_COMMUNITY)
Admission: EM | Admit: 2018-11-11 | Discharge: 2018-11-11 | Disposition: A | Discharge status: Home / Self Care | Payer: Self-pay | Attending: Emergency Medicine | Admitting: Emergency Medicine

## 2018-11-11 ENCOUNTER — Encounter (HOSPITAL_COMMUNITY): Payer: Self-pay | Admitting: Emergency Medicine

## 2018-11-11 ENCOUNTER — Other Ambulatory Visit: Payer: Self-pay

## 2018-11-11 ENCOUNTER — Emergency Department (HOSPITAL_COMMUNITY): Payer: Self-pay

## 2018-11-11 DIAGNOSIS — F17210 Nicotine dependence, cigarettes, uncomplicated: Secondary | ICD-10-CM | POA: Insufficient documentation

## 2018-11-11 DIAGNOSIS — R112 Nausea with vomiting, unspecified: Secondary | ICD-10-CM

## 2018-11-11 DIAGNOSIS — K2970 Gastritis, unspecified, without bleeding: Secondary | ICD-10-CM | POA: Insufficient documentation

## 2018-11-11 DIAGNOSIS — R1084 Generalized abdominal pain: Secondary | ICD-10-CM

## 2018-11-11 LAB — URINALYSIS, ROUTINE W REFLEX MICROSCOPIC
Bilirubin Urine: NEGATIVE
Glucose, UA: NEGATIVE mg/dL
Hgb urine dipstick: NEGATIVE
Ketones, ur: NEGATIVE mg/dL
Nitrite: NEGATIVE
Protein, ur: 30 mg/dL — AB
Specific Gravity, Urine: 1.015 (ref 1.005–1.030)
pH: 9 — ABNORMAL HIGH (ref 5.0–8.0)

## 2018-11-11 LAB — CBC
HCT: 46.7 % (ref 39.0–52.0)
Hemoglobin: 15.6 g/dL (ref 13.0–17.0)
MCH: 31.8 pg (ref 26.0–34.0)
MCHC: 33.4 g/dL (ref 30.0–36.0)
MCV: 95.3 fL (ref 80.0–100.0)
Platelets: 245 10*3/uL (ref 150–400)
RBC: 4.9 MIL/uL (ref 4.22–5.81)
RDW: 13 % (ref 11.5–15.5)
WBC: 12.9 10*3/uL — ABNORMAL HIGH (ref 4.0–10.5)
nRBC: 0 % (ref 0.0–0.2)

## 2018-11-11 LAB — COMPREHENSIVE METABOLIC PANEL
ALT: 30 U/L (ref 0–44)
AST: 50 U/L — ABNORMAL HIGH (ref 15–41)
Albumin: 4.3 g/dL (ref 3.5–5.0)
Alkaline Phosphatase: 81 U/L (ref 38–126)
Anion gap: 12 (ref 5–15)
BUN: 12 mg/dL (ref 6–20)
CO2: 28 mmol/L (ref 22–32)
Calcium: 9.3 mg/dL (ref 8.9–10.3)
Chloride: 100 mmol/L (ref 98–111)
Creatinine, Ser: 1.05 mg/dL (ref 0.61–1.24)
GFR calc Af Amer: >60 mL/min (ref >60)
GFR calc non Af Amer: >60 mL/min (ref >60)
Glucose, Bld: 130 mg/dL — ABNORMAL HIGH (ref 70–99)
Potassium: 4.5 mmol/L (ref 3.5–5.1)
Sodium: 140 mmol/L (ref 135–145)
Total Bilirubin: 2.3 mg/dL — ABNORMAL HIGH (ref 0.3–1.2)
Total Protein: 7.9 g/dL (ref 6.5–8.1)

## 2018-11-11 LAB — LIPASE, BLOOD: Lipase: 25 U/L (ref 11–51)

## 2018-11-11 MED ORDER — HYDROCODONE-ACETAMINOPHEN 5-325 MG PO TABS: 2.0000 | ORAL_TABLET | ORAL | 0 refills | Status: DC | PRN

## 2018-11-11 MED ORDER — ONDANSETRON 8 MG PO TBDP: ORAL_TABLET | ORAL | 0 refills | Status: DC

## 2018-11-11 MED ORDER — ONDANSETRON HCL 4 MG/2ML IJ SOLN
4.0000 mg | Freq: Once | INTRAMUSCULAR | Status: AC
  Administered 2018-11-11: 4 mg via INTRAVENOUS
  Filled 2018-11-11: qty 2

## 2018-11-11 MED ORDER — SODIUM CHLORIDE 0.9% FLUSH
3.0000 mL | Freq: Once | INTRAVENOUS | Status: AC
  Administered 2018-11-11: 3 mL via INTRAVENOUS

## 2018-11-11 MED ORDER — SODIUM CHLORIDE 0.9 % IV BOLUS
1000.0000 mL | Freq: Once | INTRAVENOUS | Status: AC
  Administered 2018-11-11: 1000 mL via INTRAVENOUS

## 2018-11-11 MED ORDER — OMEPRAZOLE 20 MG PO CPDR: 20.0000 mg | DELAYED_RELEASE_CAPSULE | Freq: Two times a day (BID) | ORAL | 0 refills | Status: DC

## 2018-11-11 MED ORDER — IOPAMIDOL (ISOVUE-300) INJECTION 61%
100.0000 mL | Freq: Once | INTRAVENOUS | Status: AC | PRN
  Administered 2018-11-11: 100 mL via INTRAVENOUS

## 2018-11-11 MED ORDER — MORPHINE SULFATE (PF) 4 MG/ML IV SOLN
4.0000 mg | Freq: Once | INTRAVENOUS | Status: AC
  Administered 2018-11-11: 08:00:00 4 mg via INTRAVENOUS
  Filled 2018-11-11: qty 1

## 2018-11-11 NOTE — ED Triage Notes (Signed)
Pt reports generalized abd pain and nausea that began late last night after patient got off of work. Pt denies fever, chills, diarrhea.

## 2018-11-11 NOTE — Discharge Instructions (Signed)
Begin taking Prilosec as prescribed.  Also begin taking Zofran as prescribed as needed for nausea and hydrocodone as prescribed as needed for pain.  Follow-up with your primary doctor if symptoms or not improving in the next few days, and return to the ER if you develop worsening pain, high fever, bloody stool, or other new and concerning symptoms.

## 2018-11-11 NOTE — ED Provider Notes (Signed)
[facility] EMERGENCY DEPARTMENT Provider Note   CSN: [id number] Arrival date & time: 11/11/18  2993     History   Chief Complaint Chief Complaint  Patient presents with  . Abdominal Pain    HPI Daniel [name] is a 53 y.o. male.     Patient is a 53 year old male with past medical history of gastritis, anxiety, chronic back pain, depression.  He presents today for evaluation of abdominal pain, vomiting, and nausea.  This began yesterday evening and worsened through the night.  He has been unable to sleep secondary to the symptoms.  He denies any bloody stool or vomit.  He denies any fevers or chills.  He denies any ill contacts.  Patient has had these episodes in the past, but this feels worse.  He is concerned he may have cancer.  The history is provided by the patient.  Abdominal Pain Pain location:  Generalized Pain quality: cramping   Pain radiates to:  Does not radiate Pain severity:  Moderate Onset quality:  Gradual Duration:  12 hours Timing:  Constant Progression:  Worsening Chronicity:  Recurrent Relieved by:  Nothing Worsened by:  Nothing Ineffective treatments:  None tried   Past Medical History:  Diagnosis Date  . Anxiety   . Chronic lower back pain   . Depression   . Internal hemorrhoids with other complication   . Rectal abscess     Patient Active Problem List   Diagnosis Date Noted  . Lung nodules 11/11/2012  . CAP (community acquired pneumonia) 11/11/2012  . Leukocytosis 11/11/2012  . Diarrhea 11/11/2012  . Abscess, perirectal 11/17/2010  . Hemorrhoids, internal, with bleeding 11/17/2010    Past Surgical History:  Procedure Laterality Date  . HERNIA REPAIR    . INCISION AND DRAINAGE PERIRECTAL ABSCESS  10/2010        Home Medications    Prior to Admission medications   Medication Sig Start Date End Date Taking? Authorizing Provider  HYDROcodone-acetaminophen (NORCO) 5-325 MG tablet Take 1 tablet by mouth every 6 (six)  hours as needed. Patient not taking: Reported on 12/14/2015 11/25/15   Ashley Murrain, NP  naproxen (NAPROSYN) 500 MG tablet Take 1 tablet (500 mg total) by mouth 2 (two) times daily. Patient not taking: Reported on 12/14/2015 11/25/15   Ashley Murrain, NP  ondansetron (ZOFRAN ODT) 4 MG disintegrating tablet Take 1 tablet (4 mg total) by mouth every 8 (eight) hours as needed for nausea or vomiting. Patient not taking: Reported on 12/14/2015 10/08/15   Ward, Ozella Almond, PA-C  ondansetron (ZOFRAN ODT) 4 MG disintegrating tablet Take 1 tablet (4 mg total) by mouth every 8 (eight) hours as needed for nausea or vomiting. 12/14/15   Charlann Lange, PA-C    Family History No family history on file.  Social History Social History   Tobacco Use  . Smoking status: Current Every Day Smoker    Packs/day: 0.75    Years: 28.00    Pack years: 21.00    Types: Cigarettes  . Smokeless tobacco: Former Network engineer Use Topics  . Alcohol use: Yes    Comment: 11/11/2012 "probably 2, 40oz beers/month"  . Drug use: Yes    Types: Marijuana    Comment: 11/11/2012 "last marijuana ~ 2 days ago"     Allergies   Patient has no known allergies.   Review of Systems Review of Systems  Gastrointestinal: Positive for abdominal pain.  All other systems reviewed and are negative.  Physical Exam Updated Vital Signs BP (!) 175/114 (BP Location: Left Arm)   Pulse 64   Temp 98.3 F (36.8 C) (Oral)   Resp 17   SpO2 99%   Physical Exam Vitals signs and nursing note reviewed.  Constitutional:      General: He is not in acute distress.    Appearance: He is well-developed. He is not diaphoretic.  HENT:     Head: Normocephalic and atraumatic.  Neck:     Musculoskeletal: Normal range of motion and neck supple.  Cardiovascular:     Rate and Rhythm: Normal rate and regular rhythm.     Heart sounds: No murmur. No friction rub.  Pulmonary:     Effort: Pulmonary effort is normal. No respiratory distress.      Breath sounds: Normal breath sounds. No wheezing or rales.  Abdominal:     General: Bowel sounds are normal. There is no distension.     Palpations: Abdomen is soft.     Tenderness: There is abdominal tenderness in the epigastric area. There is no right CVA tenderness, left CVA tenderness, guarding or rebound.  Musculoskeletal: Normal range of motion.  Skin:    General: Skin is warm and dry.  Neurological:     Mental Status: He is alert and oriented to person, place, and time.     Coordination: Coordination normal.      ED Treatments / Results  Labs (all labs ordered are listed, but only abnormal results are displayed) Labs Reviewed  LIPASE, BLOOD  COMPREHENSIVE METABOLIC PANEL  CBC  URINALYSIS, ROUTINE W REFLEX MICROSCOPIC    EKG None  Radiology No results found.  Procedures Procedures (including critical care time)  Medications Ordered in ED Medications  sodium chloride flush (NS) 0.9 % injection 3 mL (has no administration in time range)  sodium chloride 0.9 % bolus 1,000 mL (has no administration in time range)  ondansetron (ZOFRAN) injection 4 mg (has no administration in time range)  morphine 4 MG/ML injection 4 mg (has no administration in time range)     Initial Impression / Assessment and Plan / ED Course  I have reviewed the triage vital signs and the nursing notes.  Pertinent labs & imaging results that were available during my care of the patient were reviewed by me and considered in my medical decision making (see chart for details).  Patient presenting here with complaints of abdominal pain, nausea, and vomiting.  From reviewing his chart, it appears as though he has had similar episodes in the past, however not for several years.  Today's episode seems to be settling down and the patient is feeling better after receiving medications and fluids.  Laboratory studies are unremarkable with the exception of a mild leukocytosis.  CT scan is negative for any  acute intra-abdominal process.  At this point, I feel as though patient is appropriate for discharge.  He will be given medication for pain and nausea and also will be prescribed Prilosec for what I suspect to be gastritis.   Final Clinical Impressions(s) / ED Diagnoses   Final diagnoses:  None    ED Discharge Orders    None       Geoffery Lyonselo, Armen Waring, MD 11/11/18 1013

## 2018-11-11 NOTE — ED Notes (Signed)
Patient verbalizes understanding of discharge instructions. Opportunity for questioning and answers were provided. Armband removed by staff, pt discharged from ED.  

## 2018-11-13 ENCOUNTER — Other Ambulatory Visit: Payer: Self-pay

## 2018-11-13 ENCOUNTER — Emergency Department (HOSPITAL_COMMUNITY)
Admission: EM | Admit: 2018-11-13 | Discharge: 2018-11-13 | Disposition: A | Discharge status: Home / Self Care | Payer: Self-pay | Attending: Emergency Medicine | Admitting: Emergency Medicine

## 2018-11-13 DIAGNOSIS — F17210 Nicotine dependence, cigarettes, uncomplicated: Secondary | ICD-10-CM | POA: Insufficient documentation

## 2018-11-13 DIAGNOSIS — R197 Diarrhea, unspecified: Secondary | ICD-10-CM | POA: Insufficient documentation

## 2018-11-13 DIAGNOSIS — R112 Nausea with vomiting, unspecified: Secondary | ICD-10-CM | POA: Insufficient documentation

## 2018-11-13 DIAGNOSIS — Z79899 Other long term (current) drug therapy: Secondary | ICD-10-CM | POA: Insufficient documentation

## 2018-11-13 LAB — COMPREHENSIVE METABOLIC PANEL
ALT: 24 U/L (ref 0–44)
AST: 24 U/L (ref 15–41)
Albumin: 4 g/dL (ref 3.5–5.0)
Alkaline Phosphatase: 77 U/L (ref 38–126)
Anion gap: 12 (ref 5–15)
BUN: 11 mg/dL (ref 6–20)
CO2: 25 mmol/L (ref 22–32)
Calcium: 9.3 mg/dL (ref 8.9–10.3)
Chloride: 102 mmol/L (ref 98–111)
Creatinine, Ser: 0.94 mg/dL (ref 0.61–1.24)
GFR calc Af Amer: >60 mL/min (ref >60)
GFR calc non Af Amer: >60 mL/min (ref >60)
Glucose, Bld: 126 mg/dL — ABNORMAL HIGH (ref 70–99)
Potassium: 3.3 mmol/L — ABNORMAL LOW (ref 3.5–5.1)
Sodium: 139 mmol/L (ref 135–145)
Total Bilirubin: 1 mg/dL (ref 0.3–1.2)
Total Protein: 8 g/dL (ref 6.5–8.1)

## 2018-11-13 LAB — URINALYSIS, ROUTINE W REFLEX MICROSCOPIC
Bilirubin Urine: NEGATIVE
Glucose, UA: NEGATIVE mg/dL
Ketones, ur: 15 mg/dL — AB
Nitrite: NEGATIVE
Protein, ur: NEGATIVE mg/dL
Specific Gravity, Urine: 1.015 (ref 1.005–1.030)
pH: 7.5 (ref 5.0–8.0)

## 2018-11-13 LAB — CBC
HCT: 45.9 % (ref 39.0–52.0)
Hemoglobin: 15.4 g/dL (ref 13.0–17.0)
MCH: 31.6 pg (ref 26.0–34.0)
MCHC: 33.6 g/dL (ref 30.0–36.0)
MCV: 94.3 fL (ref 80.0–100.0)
Platelets: 247 10*3/uL (ref 150–400)
RBC: 4.87 MIL/uL (ref 4.22–5.81)
RDW: 12.5 % (ref 11.5–15.5)
WBC: 13 10*3/uL — ABNORMAL HIGH (ref 4.0–10.5)
nRBC: 0 % (ref 0.0–0.2)

## 2018-11-13 LAB — RAPID URINE DRUG SCREEN, HOSP PERFORMED
Amphetamines: NOT DETECTED
Barbiturates: NOT DETECTED
Benzodiazepines: NOT DETECTED
Cocaine: NOT DETECTED
Opiates: POSITIVE — AB
Tetrahydrocannabinol: POSITIVE — AB

## 2018-11-13 LAB — URINALYSIS, MICROSCOPIC (REFLEX): Squamous Epithelial / LPF: NONE SEEN (ref 0–5)

## 2018-11-13 LAB — LIPASE, BLOOD: Lipase: 28 U/L (ref 11–51)

## 2018-11-13 MED ORDER — PROMETHAZINE HCL 25 MG/ML IJ SOLN
25.0000 mg | Freq: Once | INTRAMUSCULAR | Status: AC
  Administered 2018-11-13: 25 mg via INTRAVENOUS
  Filled 2018-11-13: qty 1

## 2018-11-13 MED ORDER — LOPERAMIDE HCL 2 MG PO CAPS
4.0000 mg | ORAL_CAPSULE | Freq: Once | ORAL | Status: AC
  Administered 2018-11-13: 07:00:00 4 mg via ORAL
  Filled 2018-11-13: qty 2

## 2018-11-13 MED ORDER — ACETAMINOPHEN 500 MG PO TABS
1000.0000 mg | ORAL_TABLET | Freq: Once | ORAL | Status: AC
  Administered 2018-11-13: 1000 mg via ORAL
  Filled 2018-11-13: qty 2

## 2018-11-13 MED ORDER — PROMETHAZINE HCL 25 MG PO TABS: 25.0000 mg | ORAL_TABLET | Freq: Four times a day (QID) | ORAL | 0 refills | Status: DC | PRN

## 2018-11-13 MED ORDER — SODIUM CHLORIDE 0.9 % IV BOLUS (SEPSIS)
1000.0000 mL | Freq: Once | INTRAVENOUS | Status: AC
  Administered 2018-11-13: 07:00:00 1000 mL via INTRAVENOUS

## 2018-11-13 MED ORDER — DICYCLOMINE HCL 20 MG PO TABS: 20.0000 mg | ORAL_TABLET | Freq: Three times a day (TID) | ORAL | 0 refills | Status: DC | PRN

## 2018-11-13 MED ORDER — ONDANSETRON 4 MG PO TBDP
4.0000 mg | ORAL_TABLET | Freq: Once | ORAL | Status: AC | PRN
  Administered 2018-11-13: 4 mg via ORAL
  Filled 2018-11-13: qty 1

## 2018-11-13 MED ORDER — DICYCLOMINE HCL 10 MG/ML IM SOLN
20.0000 mg | Freq: Once | INTRAMUSCULAR | Status: AC
  Administered 2018-11-13: 07:00:00 20 mg via INTRAMUSCULAR
  Filled 2018-11-13: qty 2

## 2018-11-13 NOTE — ED Triage Notes (Signed)
Pt in triage c/o abd pain and crying; states was seen here two days ago for same. States he could not afford his medication

## 2018-11-13 NOTE — Discharge Instructions (Signed)
You may alternate Tylenol 1000 mg every 6 hours as needed for pain and Ibuprofen 800 mg every 8 hours as needed for pain.  Please take Ibuprofen with food. ° °

## 2018-11-13 NOTE — ED Provider Notes (Signed)
TIME SEEN: 6:15 AM  CHIEF COMPLAINT: Nausea, vomiting, diarrhea  HPI: Patient is a 53 year old male with history of substance abuse who presents to the emergency department with several days of nausea, vomiting and diarrhea.  Seen in the emergency department 2 days ago for the same and had a CT scan that showed no acute abnormality.  Discharged with prescription of Zofran but states he could not afford this medication.  Continues to vomit at home.  Complains of a "queasiness" in his abdomen.  No previous abdominal surgery.  Reports subjective fevers at home.  No sick contacts.  ROS: See HPI Constitutional:  fever  Eyes: no drainage  ENT: no runny nose   Cardiovascular:  no chest pain  Resp: no SOB  GI: Vomiting and diarrhea GU: no dysuria Integumentary: no rash  Allergy: no hives  Musculoskeletal: no leg swelling  Neurological: no slurred speech ROS otherwise negative  PAST MEDICAL HISTORY/PAST SURGICAL HISTORY:  Past Medical History:  Diagnosis Date  . Anxiety   . Chronic lower back pain   . Depression   . Internal hemorrhoids with other complication   . Rectal abscess     MEDICATIONS:  Prior to Admission medications   Medication Sig Start Date End Date Taking? Authorizing Provider  HYDROcodone-acetaminophen (NORCO) 5-325 MG tablet Take 2 tablets by mouth every 4 (four) hours as needed. 11/11/18   Veryl Speak, MD  naproxen (NAPROSYN) 500 MG tablet Take 1 tablet (500 mg total) by mouth 2 (two) times daily. Patient not taking: Reported on 12/14/2015 11/25/15   Ashley Murrain, NP  omeprazole (PRILOSEC) 20 MG capsule Take 1 capsule (20 mg total) by mouth 2 (two) times daily before a meal. 11/11/18   Veryl Speak, MD  ondansetron (ZOFRAN ODT) 4 MG disintegrating tablet Take 1 tablet (4 mg total) by mouth every 8 (eight) hours as needed for nausea or vomiting. Patient not taking: Reported on 12/14/2015 10/08/15   Ward, Ozella Almond, PA-C  ondansetron (ZOFRAN ODT) 8 MG disintegrating  tablet 8mg  ODT q4 hours prn nausea 11/11/18   Veryl Speak, MD    ALLERGIES:  No Known Allergies  SOCIAL HISTORY:  Social History   Tobacco Use  . Smoking status: Current Every Day Smoker    Packs/day: 0.75    Years: 28.00    Pack years: 21.00    Types: Cigarettes  . Smokeless tobacco: Former Network engineer Use Topics  . Alcohol use: Yes    Comment: 11/11/2012 "probably 2, 40oz beers/month"    FAMILY HISTORY: No family history on file.  EXAM: BP (!) 157/88 (BP Location: Left Arm)   Pulse 77   Temp 99 F (37.2 C) (Oral)   Resp 16   SpO2 98%  CONSTITUTIONAL: Alert and oriented and responds appropriately to questions. Well-appearing; well-nourished HEAD: Normocephalic EYES: Conjunctivae clear, pupils appear equal, EOMI ENT: normal nose; moist mucous membranes NECK: Supple, no meningismus, no nuchal rigidity, no LAD  CARD: RRR; S1 and S2 appreciated; no murmurs, no clicks, no rubs, no gallops RESP: Normal chest excursion without splinting or tachypnea; breath sounds clear and equal bilaterally; no wheezes, no rhonchi, no rales, no hypoxia or respiratory distress, speaking full sentences ABD/GI: Normal bowel sounds; non-distended; soft, non-tender, no rebound, no guarding, no peritoneal signs, no hepatosplenomegaly BACK:  The back appears normal and is non-tender to palpation, there is no CVA tenderness EXT: Normal ROM in all joints; non-tender to palpation; no edema; normal capillary refill; no cyanosis, no calf tenderness or swelling  SKIN: Normal color for age and race; warm; no rash NEURO: Moves all extremities equally PSYCH: The patient's mood and manner are appropriate. Grooming and personal hygiene are appropriate.  MEDICAL DECISION MAKING: Patient here with complaints of vomiting and diarrhea.  Abdominal exam benign.  Repeat labs obtained in triage are unremarkable.  Urine does show small leukocyte esterase with 6-10 white blood cells but few bacteria.  He is not  having urinary symptoms at this time.  Will add on drug screen given history of substance abuse.  He frequently uses cocaine and marijuana.  Was provided with prescription for Vicodin on discharge on the 28th.  ED PROGRESS: Drug screen positive for opiates and THC.  Patient reports feeling better.  Will discharge after fluid challenge.  Will discharge with Bentyl, Phenergan.  Recommended bland diet.  Patient comfortable with plan.  Suspect viral gastroenteritis.   At this time, I do not feel there is any life-threatening condition present. I have reviewed and discussed all results (EKG, imaging, lab, urine as appropriate) and exam findings with patient/family. I have reviewed nursing notes and appropriate previous records.  I feel the patient is safe to be discharged home without further emergent workup and can continue workup as an outpatient as needed. Discussed usual and customary return precautions. Patient/family verbalize understanding and are comfortable with this plan.  Outpatient follow-up has been provided as needed. All questions have been answered.      Ward, Layla MawKristen N, DO 11/13/18 [phone]

## 2018-12-18 ENCOUNTER — Emergency Department (HOSPITAL_COMMUNITY)
Admission: EM | Admit: 2018-12-18 | Discharge: 2018-12-19 | Discharge status: Against Medical Advice | Payer: Self-pay | Attending: Emergency Medicine | Admitting: Emergency Medicine

## 2018-12-18 DIAGNOSIS — Z5321 Procedure and treatment not carried out due to patient leaving prior to being seen by health care provider: Secondary | ICD-10-CM | POA: Insufficient documentation

## 2018-12-18 LAB — URINALYSIS, ROUTINE W REFLEX MICROSCOPIC
Bilirubin Urine: NEGATIVE
Glucose, UA: NEGATIVE mg/dL
Hgb urine dipstick: NEGATIVE
Ketones, ur: 80 mg/dL — AB
Nitrite: NEGATIVE
Protein, ur: 100 mg/dL — AB
Specific Gravity, Urine: 1.02 (ref 1.005–1.030)
pH: 7 (ref 5.0–8.0)

## 2018-12-18 LAB — COMPREHENSIVE METABOLIC PANEL
ALT: 22 U/L (ref 0–44)
AST: 30 U/L (ref 15–41)
Albumin: 4.4 g/dL (ref 3.5–5.0)
Alkaline Phosphatase: 78 U/L (ref 38–126)
Anion gap: 14 (ref 5–15)
BUN: 9 mg/dL (ref 6–20)
CO2: 22 mmol/L (ref 22–32)
Calcium: 9.9 mg/dL (ref 8.9–10.3)
Chloride: 106 mmol/L (ref 98–111)
Creatinine, Ser: 0.98 mg/dL (ref 0.61–1.24)
GFR calc Af Amer: >60 mL/min (ref >60)
GFR calc non Af Amer: >60 mL/min (ref >60)
Glucose, Bld: 139 mg/dL — ABNORMAL HIGH (ref 70–99)
Potassium: 3.7 mmol/L (ref 3.5–5.1)
Sodium: 142 mmol/L (ref 135–145)
Total Bilirubin: 0.9 mg/dL (ref 0.3–1.2)
Total Protein: 8.8 g/dL — ABNORMAL HIGH (ref 6.5–8.1)

## 2018-12-18 LAB — CBC
HCT: 47.9 % (ref 39.0–52.0)
Hemoglobin: 15.8 g/dL (ref 13.0–17.0)
MCH: 31.3 pg (ref 26.0–34.0)
MCHC: 33 g/dL (ref 30.0–36.0)
MCV: 95 fL (ref 80.0–100.0)
Platelets: 283 10*3/uL (ref 150–400)
RBC: 5.04 MIL/uL (ref 4.22–5.81)
RDW: 12.8 % (ref 11.5–15.5)
WBC: 11.1 10*3/uL — ABNORMAL HIGH (ref 4.0–10.5)
nRBC: 0 % (ref 0.0–0.2)

## 2018-12-18 LAB — TROPONIN I (HIGH SENSITIVITY): Troponin I (High Sensitivity): 5 ng/L (ref <18)

## 2018-12-18 LAB — LIPASE, BLOOD: Lipase: 23 U/L (ref 11–51)

## 2018-12-18 MED ORDER — SODIUM CHLORIDE 0.9% FLUSH: 3.0000 mL | Freq: Once | INTRAVENOUS | Status: DC

## 2018-12-18 MED ORDER — ONDANSETRON 4 MG PO TBDP
4.0000 mg | ORAL_TABLET | Freq: Once | ORAL | Status: AC | PRN
  Administered 2018-12-18: 21:00:00 4 mg via ORAL
  Filled 2018-12-18: qty 1

## 2018-12-18 NOTE — ED Notes (Signed)
Called pt for labs, no answer x1 

## 2018-12-18 NOTE — ED Notes (Signed)
Called pt for labs, no answer x2 

## 2018-12-18 NOTE — ED Triage Notes (Signed)
To ED for eval of abd pain and cp - started today after lunch. States he has had this pain in the past but unsure what the dx was. Vomiting approx 10 times today. Pt moaning loudly in triage. Skin w/d, resp e/u

## 2018-12-18 NOTE — ED Notes (Signed)
Pt assisted to waiting room via wheelchair and wanting to lay on bench in waiting room. Blanket given. Pt thankful.

## 2018-12-19 NOTE — ED Notes (Signed)
Called for recheck vitals, no answer °

## 2018-12-25 ENCOUNTER — Telehealth (HOSPITAL_COMMUNITY): Payer: Self-pay | Admitting: Emergency Medicine

## 2018-12-25 ENCOUNTER — Encounter (HOSPITAL_COMMUNITY): Payer: Self-pay

## 2018-12-25 ENCOUNTER — Ambulatory Visit (HOSPITAL_COMMUNITY)
Admission: EM | Admit: 2018-12-25 | Discharge: 2018-12-25 | Disposition: A | Discharge status: Home / Self Care | Payer: Self-pay | Attending: Emergency Medicine | Admitting: Emergency Medicine

## 2018-12-25 ENCOUNTER — Other Ambulatory Visit: Payer: Self-pay

## 2018-12-25 DIAGNOSIS — R1084 Generalized abdominal pain: Secondary | ICD-10-CM

## 2018-12-25 DIAGNOSIS — R112 Nausea with vomiting, unspecified: Secondary | ICD-10-CM

## 2018-12-25 MED ORDER — OMEPRAZOLE 20 MG PO CPDR: 20.0000 mg | DELAYED_RELEASE_CAPSULE | Freq: Every day | ORAL | 0 refills | Status: DC

## 2018-12-25 MED ORDER — ONDANSETRON 4 MG PO TBDP
4.0000 mg | ORAL_TABLET | Freq: Once | ORAL | Status: AC
  Administered 2018-12-25: 4 mg via ORAL

## 2018-12-25 MED ORDER — DICYCLOMINE HCL 20 MG PO TABS: 20.0000 mg | ORAL_TABLET | Freq: Three times a day (TID) | ORAL | 0 refills | Status: DC

## 2018-12-25 MED ORDER — ONDANSETRON 4 MG PO TBDP
ORAL_TABLET | ORAL | Status: AC
  Filled 2018-12-25: qty 1

## 2018-12-25 MED ORDER — ONDANSETRON 4 MG PO TBDP: 4.0000 mg | ORAL_TABLET | Freq: Three times a day (TID) | ORAL | 0 refills | Status: DC | PRN

## 2018-12-25 MED ORDER — MECLIZINE HCL 12.5 MG PO TABS: 12.5000 mg | ORAL_TABLET | Freq: Three times a day (TID) | ORAL | 0 refills | Status: DC | PRN

## 2018-12-25 NOTE — ED Triage Notes (Signed)
Patient presents to Urgent Care with complaints of abdominal pain since about 1.5 months ago. Patient reports it is worse at night. Family told pt to come here for an ultrasound.

## 2018-12-25 NOTE — Telephone Encounter (Signed)
Resent medicines over to [facility] on [address].  Added in meclizine to help with patient's nauseousness and dizziness at bedtime.  Also informed of referral placed to low our gastroenterology for follow-up.

## 2018-12-25 NOTE — Discharge Instructions (Addendum)
Please continue to use Zofran, dissolves underneath tongue every 8 hours as needed for nausea and vomiting Please use Bentyl before meals and bedtime to help with abdominal discomfort/cramping Prilosec daily to help reduce acid Please read attached and avoid trigger foods  If symptoms continuing to be recurrent/persistent please follow-up with gastroenterology for further evaluation  Follow-up in emergency room if developing worsening pain, fever, pain localizing to right upper area or right lower area, dizziness, lightheadedness, persistent vomiting

## 2018-12-25 NOTE — ED Provider Notes (Addendum)
[facility]    CSN: [id number] Arrival date & time: 12/25/18  1237      History   Chief Complaint Chief Complaint  Patient presents with  . Abdominal Pain    HPI Daniel [name] is a 53 y.o. male history of anxiety, depression, presenting today for evaluation of abdominal pain nausea and vomiting.  Patient states that for the past 1 to 2 months he has had abdominal pain with episodes of nausea and vomiting.  States that he feels it is often triggered at nighttime.  Last night he had a wave of nausea and has had persistent vomiting into today.  His pain has worsened since this is started.  States it is generalized.  Denies any specific area that hurts worse than the other.  Denies radiation to back or shoulder.  Denies chest pain or shortness of breath.  Denies fevers chills body aches.  Denies urinary symptoms of dysuria or hematuria.  He states that he believes his symptoms started after his aunt passed away from gastric cancer.  He was seen in the emergency room twice at the end of July with negative CT scans and unremarkable blood work.  Denies previous surgeries on abdomen.  He is concerned he may have an ulcer.  States that prior to symptoms starting he was drinking alcohol, he has not drink any alcohol recently.  Feels his pain is related to frequent vomiting.  HPI  Past Medical History:  Diagnosis Date  . Anxiety   . Chronic lower back pain   . Depression   . Internal hemorrhoids with other complication   . Rectal abscess     Patient Active Problem List   Diagnosis Date Noted  . Lung nodules 11/11/2012  . CAP (community acquired pneumonia) 11/11/2012  . Leukocytosis 11/11/2012  . Diarrhea 11/11/2012  . Abscess, perirectal 11/17/2010  . Hemorrhoids, internal, with bleeding 11/17/2010    Past Surgical History:  Procedure Laterality Date  . HERNIA REPAIR    . INCISION AND DRAINAGE PERIRECTAL ABSCESS  10/2010       Home Medications    Prior to Admission  medications   Medication Sig Start Date End Date Taking? Authorizing Provider  dicyclomine (BENTYL) 20 MG tablet Take 1 tablet (20 mg total) by mouth 4 (four) times daily -  before meals and at bedtime. 12/25/18   Rily Nickey C, PA-C  omeprazole (PRILOSEC) 20 MG capsule Take 1 capsule (20 mg total) by mouth daily for 14 days. 12/25/18 01/08/19  Diavian Furgason C, PA-C  ondansetron (ZOFRAN ODT) 4 MG disintegrating tablet Take 1 tablet (4 mg total) by mouth every 8 (eight) hours as needed for nausea or vomiting. 12/25/18   Ebbie Sorenson C, PA-C  promethazine (PHENERGAN) 25 MG tablet Take 1 tablet (25 mg total) by mouth every 6 (six) hours as needed for nausea or vomiting. 11/13/18 12/25/18  Ward, Layla Maw, DO    Family History Family History  Problem Relation Age of Onset  . Cancer Sister     Social History Social History   Tobacco Use  . Smoking status: Current Every Day Smoker    Packs/day: 0.75    Years: 28.00    Pack years: 21.00    Types: Cigarettes  . Smokeless tobacco: Former Engineer, water Use Topics  . Alcohol use: Yes  . Drug use: Yes    Types: Marijuana     Allergies   Patient has no known allergies.   Review of Systems Review  of Systems  Constitutional: Negative for activity change, appetite change, chills, fatigue and fever.  HENT: Negative for congestion, ear pain, rhinorrhea, sinus pressure, sore throat and trouble swallowing.   Eyes: Negative for discharge and redness.  Respiratory: Negative for cough, chest tightness and shortness of breath.   Cardiovascular: Negative for chest pain.  Gastrointestinal: Positive for abdominal pain, nausea and vomiting. Negative for diarrhea.  Genitourinary: Negative for dysuria, hematuria and urgency.  Musculoskeletal: Negative for myalgias.  Skin: Negative for rash.  Neurological: Negative for dizziness, light-headedness and headaches.     Physical Exam Triage Vital Signs ED Triage Vitals  Enc Vitals Group     BP  12/25/18 1304 125/80     Pulse Rate 12/25/18 1304 77     Resp 12/25/18 1304 16     Temp 12/25/18 1304 98.5 F (36.9 C)     Temp Source 12/25/18 1304 Oral     SpO2 12/25/18 1304 100 %     Weight --      Height --      Head Circumference --      Peak Flow --      Pain Score 12/25/18 1302 4     Pain Loc --      Pain Edu? --      Excl. in [place]? --    No data found.  Updated Vital Signs BP 125/80 (BP Location: Right Arm)   Pulse 77   Temp 98.5 F (36.9 C) (Oral)   Resp 16   SpO2 100%   Visual Acuity Right Eye Distance:   Left Eye Distance:   Bilateral Distance:    Right Eye Near:   Left Eye Near:    Bilateral Near:     Physical Exam Vitals signs and nursing note reviewed.  Constitutional:      Appearance: He is well-developed.  HENT:     Head: Normocephalic and atraumatic.     Mouth/Throat:     Comments: Oral mucosa pink and moist, no tonsillar enlargement or exudate. Posterior pharynx patent and nonerythematous, no uvula deviation or swelling. Normal phonation. Eyes:     Conjunctiva/sclera: Conjunctivae normal.  Neck:     Musculoskeletal: Neck supple.  Cardiovascular:     Rate and Rhythm: Normal rate and regular rhythm.     Heart sounds: No murmur.  Pulmonary:     Effort: Pulmonary effort is normal. No respiratory distress.     Breath sounds: Normal breath sounds.     Comments: Breathing comfortably at rest, CTABL, no wheezing, rales or other adventitious sounds auscultated  Abdominal:     Palpations: Abdomen is soft.     Tenderness: There is abdominal tenderness.     Comments: Soft, nondistended, generalized tenderness throughout entire abdomen, patient appears uncomfortable with entire palpation of abdomen, no focal tenderness, negative rebound  Skin:    General: Skin is warm and dry.  Neurological:     Mental Status: He is alert.      UC Treatments / Results  Labs (all labs ordered are listed, but only abnormal results are displayed) Labs Reviewed -  No data to display  EKG   Radiology No results found.  Procedures Procedures (including critical care time)  Medications Ordered in UC Medications  ondansetron (ZOFRAN-ODT) disintegrating tablet 4 mg (4 mg Oral Given 12/25/18 1331)  ondansetron (ZOFRAN-ODT) 4 MG disintegrating tablet (has no administration in time range)    Initial Impression / Assessment and Plan / UC Course  I have reviewed the  triage vital signs and the nursing notes.  Pertinent labs & imaging results that were available during my care of the patient were reviewed by me and considered in my medical decision making (see chart for details).     Patient with abdominal pain that has waxed and waned over 2 months.  Previous CT imaging negative.  Has had recent onset of new spell of nausea and vomiting.  Vital signs stable today without fever, tachycardia. Discussed with patient given amount of tenderness he may warrant repeat imaging.  Feels his tenderness is mainly related to frequent vomiting.  Wishing to try outpatient therapy before returning to emergency room.  Will place on Zofran, daily omeprazole to help with any acid, gastritis related to symptoms.  Bentyl as needed for  cramping sensation.  Follow-up with gastroenterology outpatient for further evaluation, possible EGD given symptoms becoming more chronic.  Follow-up in ED if worsening.Discussed strict return precautions. Patient verbalized understanding and is agreeable with plan.  Final Clinical Impressions(s) / UC Diagnoses    Final diagnoses:  Generalized abdominal pain  Non-intractable vomiting with nausea, unspecified vomiting type     Discharge Instructions     Please continue to use Zofran, dissolves underneath tongue every 8 hours as needed for nausea and vomiting Please use Bentyl before meals and bedtime to help with abdominal discomfort/cramping Prilosec daily to help reduce acid Please read attached and avoid trigger foods  If symptoms  continuing to be recurrent/persistent please follow-up with gastroenterology for further evaluation  Follow-up in emergency room if developing worsening pain, fever, pain localizing to right upper area or right lower area, dizziness, lightheadedness, persistent vomiting   ED Prescriptions    Medication Sig Dispense Auth. Provider   ondansetron (ZOFRAN ODT) 4 MG disintegrating tablet Take 1 tablet (4 mg total) by mouth every 8 (eight) hours as needed for nausea or vomiting. 20 tablet Staley Budzinski C, PA-C   dicyclomine (BENTYL) 20 MG tablet Take 1 tablet (20 mg total) by mouth 4 (four) times daily -  before meals and at bedtime. 20 tablet Marcell Pfeifer C, PA-C   omeprazole (PRILOSEC) 20 MG capsule Take 1 capsule (20 mg total) by mouth daily for 14 days. 14 capsule Namiyah Grantham C, PA-C     Controlled Substance Prescriptions [place] Controlled Substance Registry consulted? Not Applicable   Lew DawesWieters, [place] Fritze C, PA-C 12/25/18 1344    [place], [place] GroveHallie C, [place] JerseyPA-C 12/25/18 1345

## 2018-12-25 NOTE — Telephone Encounter (Signed)
*  [facility] gastro[facility]ology referral placed; spelling error from previous note

## 2018-12-29 ENCOUNTER — Encounter: Payer: Self-pay | Admitting: Gastroenterology

## 2019-01-05 ENCOUNTER — Ambulatory Visit (INDEPENDENT_AMBULATORY_CARE_PROVIDER_SITE_OTHER): Payer: Self-pay | Admitting: Gastroenterology

## 2019-01-05 ENCOUNTER — Encounter: Payer: Self-pay | Admitting: Gastroenterology

## 2019-01-05 VITALS — BP 104/62 | HR 73 | Temp 98.5°F | Ht 68.0 in | Wt 133.0 lb

## 2019-01-05 DIAGNOSIS — Z800 Family history of malignant neoplasm of digestive organs: Secondary | ICD-10-CM

## 2019-01-05 DIAGNOSIS — R112 Nausea with vomiting, unspecified: Secondary | ICD-10-CM

## 2019-01-05 DIAGNOSIS — Z1211 Encounter for screening for malignant neoplasm of colon: Secondary | ICD-10-CM

## 2019-01-05 MED ORDER — ONDANSETRON HCL 4 MG PO TABS: 4.0000 mg | ORAL_TABLET | Freq: Two times a day (BID) | ORAL | 4 refills | Status: DC

## 2019-01-05 MED ORDER — PEG 3350-KCL-NA BICARB-NACL 420 G PO SOLR: 4000.0000 mL | ORAL | 0 refills | Status: DC

## 2019-01-05 NOTE — Patient Instructions (Signed)
You have been scheduled for a colonoscopy/EGD. Please follow written instructions given to you at your visit today.  Please pick up your prep supplies at the pharmacy within the next 1-3 days. If you use inhalers (even only as needed), please bring them with you on the day of your procedure. Your physician has requested that you go to www.startemmi.com and enter the access code given to you at your visit today. This web site gives a general overview about your procedure. However, you should still follow specific instructions given to you by our office regarding your preparation for the procedure.  We have sent the following medications to your pharmacy for you to pick up at your convenience:  Thank you for entrusting me with your care and choosing [facility].  Dr Ardis Hughs

## 2019-01-05 NOTE — Progress Notes (Signed)
HPI: This is a very pleasant 53 year old man who was referred to me by Lew Dawes, PA-C  to evaluate abdominal pain, nausea, vomiting, weight loss.    Chief complaint is abdominal pain, nausea, vomiting, weight loss  He has had nausea vomiting and epigastric pains for many months.  The abdominal pain is in his epigastrium, it is nonradiating.  It is associate with nausea and vomiting.  He has lost 15 to 20 pounds throughout this.  He does periodically get heartburn but not very often.  He takes NSAIDs only rarely.  He tells me that taking a hot water bath really helps his nausea.  He smokes marijuana 2 or 3 times a day and has been doing this for about 30 years.  He denies using any other illicit chemicals.  His sister recently passed away from stomach cancer.  His aunt died of stomach cancer a month or 2 prior to that.  Old Data Reviewed: CT scan abdomen pelvis with IV and oral contrast July 2020.  Cyst in the liver, nonobstructive right nephrolithiasis.  Otherwise normal examination of the abdomen.  Blood work September 2020; CBC was normal except for slightly elevated white blood cell count at 11,000, normal lipase, normal complete metabolic profile except for total protein of 8.8, urinalysis seemed positive to my reading.    July 2020 labs urine tox screen was positive for opiates and THC  Review of systems: Pertinent positive and negative review of systems were noted in the above HPI section. All other review negative.   Past Medical History:  Diagnosis Date  . Anxiety   . Chronic lower back pain   . Depression   . Internal hemorrhoids with other complication   . Rectal abscess     Past Surgical History:  Procedure Laterality Date  . HERNIA REPAIR    . INCISION AND DRAINAGE PERIRECTAL ABSCESS  10/2010    Current Outpatient Medications  Medication Sig Dispense Refill  . dicyclomine (BENTYL) 20 MG tablet Take 1 tablet (20 mg total) by mouth 4 (four) times daily -   before meals and at bedtime. 20 tablet 0  . meclizine (ANTIVERT) 12.5 MG tablet Take 1 tablet (12.5 mg total) by mouth 3 (three) times daily as needed for dizziness or nausea. 16 tablet 0  . omeprazole (PRILOSEC) 20 MG capsule Take 1 capsule (20 mg total) by mouth daily for 14 days. 14 capsule 0  . ondansetron (ZOFRAN ODT) 4 MG disintegrating tablet Take 1 tablet (4 mg total) by mouth every 8 (eight) hours as needed for nausea or vomiting. 20 tablet 0   No current facility-administered medications for this visit.     Allergies as of 01/05/2019  . (No Known Allergies)    Family History  Problem Relation Age of Onset  . Cancer Sister     Social History   Socioeconomic History  . Marital status: Single    Spouse name: Not on file  . Number of children: Not on file  . Years of education: Not on file  . Highest education level: Not on file  Occupational History  . Not on file  Social Needs  . Financial resource strain: Not on file  . Food insecurity    Worry: Not on file    Inability: Not on file  . Transportation needs    Medical: Not on file    Non-medical: Not on file  Tobacco Use  . Smoking status: Current Every Day Smoker    Packs/day:  0.75    Years: 28.00    Pack years: 21.00    Types: Cigarettes  . Smokeless tobacco: Former Network engineer and Sexual Activity  . Alcohol use: Yes  . Drug use: Yes    Types: Marijuana  . Sexual activity: Not Currently  Lifestyle  . Physical activity    Days per week: Not on file    Minutes per session: Not on file  . Stress: Not on file  Relationships  . Social Herbalist on phone: Not on file    Gets together: Not on file    Attends religious service: Not on file    Active member of club or organization: Not on file    Attends meetings of clubs or organizations: Not on file    Relationship status: Not on file  . Intimate partner violence    Fear of current or ex partner: Not on file    Emotionally abused: Not on  file    Physically abused: Not on file    Forced sexual activity: Not on file  Other Topics Concern  . Not on file  Social History Narrative  . Not on file     Physical Exam: BP 104/62 (BP Location: Right Arm, Patient Position: Sitting, Cuff Size: Normal)   Pulse 73   Temp 98.5 F (36.9 C) (Oral)   Ht 5\' 8"  (1.727 m)   Wt 133 lb (60.3 kg)   BMI 20.22 kg/m  Constitutional: generally well-appearing, smells of marijuana Psychiatric: alert and oriented x3 Eyes: extraocular movements intact Mouth: oral pharynx moist, no lesions Neck: supple no lymphadenopathy Cardiovascular: heart regular rate and rhythm Lungs: clear to auscultation bilaterally Abdomen: soft, nontender, nondistended, no obvious ascites, no peritoneal signs, normal bowel sounds Extremities: no lower extremity edema bilaterally Skin: no lesions on visible extremities   Assessment and plan: 53 y.o. male with nausea, vomiting, abdominal pain, weight loss, family history of stomach cancer  First he has been smoking marijuana 2 or 3 times a day for the past 30 years.  The examination room actually smelled of marijuana however I think that was just from his close and skin, I do not think he was smoking it in the room.  This is very likely playing a role in his symptoms.  Without prompting he told me that hot water baths significantly help his nausea which is a hallmark of cannabis hyperemesis.  I think the hyperemesis is probably was causing his abdominal pains.  CT scan this month exonerate much of the GI tract however with his sister having gastric cancer recently it is certainly reasonable to proceed with EGD at his soonest convenience to rule that out.  At the same time I will get him up-to-date on colon cancer screening with a colonoscopy.  I am calling in Zofran 4 mg pills 1 pill twice daily as needed for nausea for now and I recommended very strongly to him that he try significantly cutting back his marijuana  usage    Please see the "Patient Instructions" section for addition details about the plan.   Owens Loffler, MD [facility] 01/05/2019, 1:48 PM  Cc: Wieters, [place] C, PA-C

## 2019-01-15 ENCOUNTER — Telehealth: Payer: Self-pay

## 2019-01-15 NOTE — Telephone Encounter (Signed)
Covid-19 screening questions   Do you now or have you had a fever in the last 14 days?  Do you have any respiratory symptoms of shortness of breath or cough now or in the last 14 days?  Do you have any family members or close contacts with diagnosed or suspected Covid-19 in the past 14 days?  Have you been tested for Covid-19 and found to be positive?       

## 2019-01-16 ENCOUNTER — Encounter: Payer: Self-pay | Admitting: Gastroenterology

## 2019-01-16 ENCOUNTER — Telehealth: Payer: Self-pay | Admitting: Gastroenterology

## 2019-01-16 NOTE — Telephone Encounter (Signed)
Dr. Ardis Hughs,  I spoke with patient regarding today's ENDO/COLON he missed. Pt stated he thought it was for the 21 st of October when in fact his OV was on the 21 st of September. Pt was confused and had wrong date. Pt has rescheduled for 02-20-2019 @ 1:30.

## 2019-02-20 ENCOUNTER — Encounter: Payer: Self-pay | Admitting: Gastroenterology

## 2019-02-20 ENCOUNTER — Telehealth: Payer: Self-pay | Admitting: Gastroenterology

## 2019-02-20 NOTE — Telephone Encounter (Signed)
This is the second time he has missed his colonoscopy and upper endoscopy [place] appointment.   Please contact him.  Do not offer to reschedule the [place] appointment again.  Rather I would like to see him in the office (my next available OV) before committing another 1 hour time spot to him in the [place].

## 2019-02-20 NOTE — Telephone Encounter (Signed)
Unable to reach pt voice mail is full. Letter has been mailed.

## 2019-03-02 NOTE — Telephone Encounter (Signed)
Fyi, this pt called looking to confirm his procedure with Dr. Ardis Hughs for 11/21. I told pt that his procedure was on 11/6. Pt apologized as he was under the impression that it was on 11/21. I was not able to schedule him for an ov with Dr. Ardis Hughs because he is fully booked until next year. Pt will cb at the beginning of December to r/s. We also had an old phone number for him so I updated it. If pt can see a PA pls let me know to call him. Thank you.

## 2019-05-15 ENCOUNTER — Ambulatory Visit: Payer: Self-pay | Attending: Internal Medicine

## 2020-12-07 ENCOUNTER — Emergency Department (HOSPITAL_COMMUNITY)
Admission: EM | Admit: 2020-12-07 | Discharge: 2020-12-07 | Disposition: A | Discharge status: Home / Self Care | Payer: Self-pay | Attending: Emergency Medicine | Admitting: Emergency Medicine

## 2020-12-07 ENCOUNTER — Other Ambulatory Visit: Payer: Self-pay

## 2020-12-07 ENCOUNTER — Encounter (HOSPITAL_COMMUNITY): Payer: Self-pay | Admitting: *Deleted

## 2020-12-07 DIAGNOSIS — M25571 Pain in right ankle and joints of right foot: Secondary | ICD-10-CM | POA: Insufficient documentation

## 2020-12-07 DIAGNOSIS — M25562 Pain in left knee: Secondary | ICD-10-CM | POA: Insufficient documentation

## 2020-12-07 DIAGNOSIS — M25572 Pain in left ankle and joints of left foot: Secondary | ICD-10-CM | POA: Insufficient documentation

## 2020-12-07 DIAGNOSIS — M25561 Pain in right knee: Secondary | ICD-10-CM | POA: Insufficient documentation

## 2020-12-07 DIAGNOSIS — F17210 Nicotine dependence, cigarettes, uncomplicated: Secondary | ICD-10-CM | POA: Insufficient documentation

## 2020-12-07 MED ORDER — NAPROXEN 375 MG PO TABS: 375.0000 mg | ORAL_TABLET | Freq: Two times a day (BID) | ORAL | 0 refills | Status: DC

## 2020-12-07 MED ORDER — ALBUTEROL (5 MG/ML) CONTINUOUS INHALATION SOLN
INHALATION_SOLUTION | RESPIRATORY_TRACT | Status: AC
  Filled 2020-12-07: qty 20

## 2020-12-07 MED ORDER — NAPROXEN 250 MG PO TABS
250.0000 mg | ORAL_TABLET | Freq: Once | ORAL | Status: AC
  Administered 2020-12-07: 250 mg via ORAL
  Filled 2020-12-07: qty 1

## 2020-12-07 NOTE — ED Triage Notes (Signed)
Pt reports several days of bilateral knee and ankle pain. Denies injury but works long period of time on his feet.

## 2020-12-07 NOTE — Discharge Instructions (Addendum)
He was seen and evaluated in the emergency department today for bilateral knee and ankle pain.  As we discussed, this is likely due to your new shoes and overuse on your joints and muscles.  Please rest as able, ice, and take medication as prescribed.  Please return to the emergency department for worsening pain, swelling, fever, chills, or any other concerns you might have.  Thank you for letting me participate in your care.

## 2020-12-07 NOTE — ED Provider Notes (Signed)
[facility] EMERGENCY DEPARTMENT Provider Note   CSN: [id number] Arrival date & time: 12/07/20  0604     History Chief Complaint  Patient presents with   Knee Pain    Daniel [name] is a 55 y.o. male who presents to the emergency department today for bilateral knee and ankle pain that began 4 days ago.  He states he recently began a new job where he is on his feet all day.  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  His knee and ankle pain is constant, worse with walking up steps, and rated 6 out of 10 in severity. He reports associated swelling to his joints but denies any fever, chills, chest pain, shortness of breath, urinary complaints, bowel complaints, or injury to the area. Has not been sick recently. Denies any history of antritis or similar symptoms int he past.   Knee Pain Associated symptoms: no fever       Past Medical History:  Diagnosis Date   Anxiety    Chronic lower back pain    Depression    Internal hemorrhoids with other complication    Rectal abscess     Patient Active Problem List   Diagnosis Date Noted   Lung nodules 11/11/2012   CAP (community acquired pneumonia) 11/11/2012   Leukocytosis 11/11/2012   Diarrhea 11/11/2012   Abscess, perirectal 11/17/2010   Hemorrhoids, internal, with bleeding 11/17/2010    Past Surgical History:  Procedure Laterality Date   HERNIA REPAIR     INCISION AND DRAINAGE PERIRECTAL ABSCESS  10/2010       Family History  Problem Relation Age of Onset   Cancer Sister    HIV/AIDS Father    Stomach cancer Brother     Social History   Tobacco Use   Smoking status: Every Day    Packs/day: 0.75    Years: 28.00    Pack years: 21.00    Types: Cigarettes   Smokeless tobacco: Former  Building services engineer Use: Never used  Substance Use Topics   Alcohol use: Yes   Drug use: Yes    Types: Marijuana     Home Medications Prior to Admission medications   Medication Sig Start Date End Date Taking? Authorizing Provider  dicyclomine (BENTYL) 20 MG tablet Take 1 tablet (20 mg total) by mouth 4 (four) times daily -  before meals and at bedtime. 12/25/18   Wieters, Hallie C, PA-C  meclizine (ANTIVERT) 12.5 MG tablet Take 1 tablet (12.5 mg total) by mouth 3 (three) times daily as needed for dizziness or nausea. 12/25/18   Wieters, Hallie C, PA-C  omeprazole (PRILOSEC) 20 MG capsule Take 1 capsule (20 mg total) by mouth daily for 14 days. 12/25/18 01/08/19  Wieters, Hallie C, PA-C  ondansetron (ZOFRAN ODT) 4 MG disintegrating tablet Take 1 tablet (4 mg total) by mouth every 8 (eight) hours as needed for nausea or vomiting. 12/25/18   Wieters, Hallie C, PA-C  ondansetron (ZOFRAN) 4 MG tablet Take 1 tablet (4 mg total) by mouth 2 (two) times daily. 01/05/19   Rachael Fee, MD  polyethylene glycol-electrolytes (NULYTELY/GOLYTELY) 420 g solution Take 4,000 mLs by mouth as directed. 01/05/19   Rachael Fee, MD  promethazine (PHENERGAN) 25 MG tablet Take 1 tablet (25 mg total) by mouth every 6 (six) hours as needed for nausea or vomiting. 11/13/18 12/25/18  Ward, Layla Maw, DO    Allergies    Patient has no known allergies.  Review of Systems   Review of Systems  Constitutional:  Positive for activity change. Negative for chills and fever.  HENT:  Negative for congestion, sinus pain and sore throat.   Respiratory:  Negative for shortness of breath.   Cardiovascular:  Negative for chest pain.  Gastrointestinal:  Negative for abdominal pain, nausea and vomiting.  Genitourinary:  Negative for difficulty urinating, dysuria and penile discharge.  Musculoskeletal:  Positive for arthralgias and joint swelling.   Physical Exam Updated Vital Signs BP 138/79 (BP Location: Right Arm)   Pulse 67   Temp 98.4 F (36.9 C)   Resp 18   SpO2 100%   Physical Exam Constitutional:      Appearance: Normal  appearance.  HENT:     Head: Normocephalic and atraumatic.  Eyes:     General:        Right eye: No discharge.        Left eye: No discharge.  Cardiovascular:     Rate and Rhythm: Normal rate and regular rhythm.     Pulses:          Popliteal pulses are 2+ on the right side and 2+ on the left side.       Dorsalis pedis pulses are 2+ on the right side and 2+ on the left side.  Pulmonary:     Effort: Pulmonary effort is normal.     Breath sounds: Normal breath sounds.  Abdominal:     General: Bowel sounds are normal.     Palpations: Abdomen is soft.     Tenderness: There is no abdominal tenderness.  Musculoskeletal:     Cervical back: Neck supple.     Comments: There is no bony tenderness to the bilateral knees or ankles.  There is minimal swelling to the bilateral ankles primarily posterior to the lateral and medial malleolus.  Patient does have good range of motion however it is painful with passive range of motion.  The bilateral knees and ankles are not erythematous and are without warmth.  Patient does have tenderness to the patella tendon and proximally into the quad bilaterally.  Patient had good strength and sensation bilaterally.  Neurological:     Mental Status: He is alert.     Comments: Patient ambulated without difficulty.  He had some trouble squatting down secondary to pain.  Psychiatric:        Mood and Affect: Mood and affect normal.    ED Results / Procedures / Treatments   Labs (all labs ordered are listed, but only abnormal results are displayed) Labs Reviewed - No data to display  EKG None  Radiology No results found.  Procedures Procedures   Medications Ordered in ED Medications  albuterol (VENTOLIN) (5 MG/ML) 0.5% continuous inhalation solution (has no administration in time range)  naproxen (NAPROSYN) tablet 250 mg (has no administration in time range)    ED Course  I have reviewed the triage vital signs and the nursing notes.  Pertinent labs  & imaging results that were available during my care of the patient were reviewed by me and considered in my medical decision making (see chart for details).  Clinical Course as of 12/07/20 1023  Wed Dec 07, 2020  [id number] Discussed with patient the utility of x-ray and offered imaging.  Patient declines at this time. [CF]    Clinical Course User Index [CF] Teressa Lower, [place]  MDM Rules/Calculators/A&P                           Daniel [name] is a 55 year old male who presents to the emergency department today for evaluation of bilateral knee and ankle pain.  Patient appears well.  Patient is normotensive, afebrile, and not tachycardic.  Given there is no bony tenderness on exam lack of trauma I believe acute fracture/dislocation is less likely.  Patient was ambulatory. There was no isolated joint tenderness and joints were not erythematous and not warm to palpation making septic joint less likely.  He has been otherwise healthy without any penile discharge, conjunctivitis, sore throat or any other sickness making reactive or gonococcal arthropathy less likely.  I believe patient can be safely discharged home.  Return precautions given.  Final Clinical Impression(s) / ED Diagnoses Final diagnoses:  Bilateral ankle pain, unspecified chronicity  Pain in both knees, unspecified chronicity    Rx / DC Orders ED Discharge Orders     None        Teressa Lower, [place] 12/07/20 1028    Horton, Mayer Masker, MD 12/12/20 [phone]

## 2021-03-17 ENCOUNTER — Ambulatory Visit: Payer: Self-pay | Admitting: Gastroenterology

## 2021-07-19 ENCOUNTER — Emergency Department (HOSPITAL_COMMUNITY): Payer: Self-pay

## 2021-07-19 ENCOUNTER — Emergency Department (HOSPITAL_COMMUNITY)
Admission: EM | Admit: 2021-07-19 | Discharge: 2021-07-19 | Disposition: A | Discharge status: Home / Self Care | Payer: Self-pay | Attending: Emergency Medicine | Admitting: Emergency Medicine

## 2021-07-19 ENCOUNTER — Encounter (HOSPITAL_COMMUNITY): Payer: Self-pay

## 2021-07-19 DIAGNOSIS — D72829 Elevated white blood cell count, unspecified: Secondary | ICD-10-CM | POA: Insufficient documentation

## 2021-07-19 DIAGNOSIS — R1084 Generalized abdominal pain: Secondary | ICD-10-CM | POA: Insufficient documentation

## 2021-07-19 DIAGNOSIS — R112 Nausea with vomiting, unspecified: Secondary | ICD-10-CM | POA: Insufficient documentation

## 2021-07-19 LAB — URINALYSIS, ROUTINE W REFLEX MICROSCOPIC
Bacteria, UA: NONE SEEN
Bilirubin Urine: NEGATIVE
Glucose, UA: NEGATIVE mg/dL
Hgb urine dipstick: NEGATIVE
Ketones, ur: NEGATIVE mg/dL
Nitrite: NEGATIVE
Protein, ur: 30 mg/dL — AB
Specific Gravity, Urine: 1.016 (ref 1.005–1.030)
pH: 8 (ref 5.0–8.0)

## 2021-07-19 LAB — CBC
HCT: 44.5 % (ref 39.0–52.0)
Hemoglobin: 14.7 g/dL (ref 13.0–17.0)
MCH: 31.7 pg (ref 26.0–34.0)
MCHC: 33 g/dL (ref 30.0–36.0)
MCV: 96.1 fL (ref 80.0–100.0)
Platelets: 250 K/uL (ref 150–400)
RBC: 4.63 MIL/uL (ref 4.22–5.81)
RDW: 13.3 % (ref 11.5–15.5)
WBC: 14.8 K/uL — ABNORMAL HIGH (ref 4.0–10.5)
nRBC: 0 % (ref 0.0–0.2)

## 2021-07-19 LAB — COMPREHENSIVE METABOLIC PANEL WITH GFR
ALT: 21 U/L (ref 0–44)
AST: 29 U/L (ref 15–41)
Albumin: 4.6 g/dL (ref 3.5–5.0)
Alkaline Phosphatase: 68 U/L (ref 38–126)
Anion gap: 10 (ref 5–15)
BUN: 15 mg/dL (ref 6–20)
CO2: 25 mmol/L (ref 22–32)
Calcium: 9.4 mg/dL (ref 8.9–10.3)
Chloride: 107 mmol/L (ref 98–111)
Creatinine, Ser: 0.74 mg/dL (ref 0.61–1.24)
GFR, Estimated: >60 mL/min
Glucose, Bld: 96 mg/dL (ref 70–99)
Potassium: 3.9 mmol/L (ref 3.5–5.1)
Sodium: 142 mmol/L (ref 135–145)
Total Bilirubin: 0.4 mg/dL (ref 0.3–1.2)
Total Protein: 8.2 g/dL — ABNORMAL HIGH (ref 6.5–8.1)

## 2021-07-19 LAB — LIPASE, BLOOD: Lipase: 33 U/L (ref 11–51)

## 2021-07-19 MED ORDER — HYDROMORPHONE HCL 1 MG/ML IJ SOLN
1.0000 mg | Freq: Once | INTRAMUSCULAR | Status: AC
  Administered 2021-07-19: 1 mg via INTRAVENOUS
  Filled 2021-07-19: qty 1

## 2021-07-19 MED ORDER — IOHEXOL 300 MG/ML  SOLN
100.0000 mL | Freq: Once | INTRAMUSCULAR | Status: AC | PRN
  Administered 2021-07-19: 100 mL via INTRAVENOUS

## 2021-07-19 MED ORDER — FENTANYL CITRATE PF 50 MCG/ML IJ SOSY
50.0000 ug | PREFILLED_SYRINGE | Freq: Once | INTRAMUSCULAR | Status: AC
  Administered 2021-07-19: 50 ug via INTRAVENOUS
  Filled 2021-07-19: qty 1

## 2021-07-19 MED ORDER — ONDANSETRON 4 MG PO TBDP: 4.0000 mg | ORAL_TABLET | Freq: Three times a day (TID) | ORAL | 0 refills | Status: DC | PRN

## 2021-07-19 MED ORDER — DROPERIDOL 2.5 MG/ML IJ SOLN
1.2500 mg | Freq: Once | INTRAMUSCULAR | Status: AC
  Administered 2021-07-19: 1.25 mg via INTRAVENOUS
  Filled 2021-07-19: qty 2

## 2021-07-19 MED ORDER — ONDANSETRON HCL 4 MG/2ML IJ SOLN
4.0000 mg | Freq: Once | INTRAMUSCULAR | Status: AC
  Administered 2021-07-19: 4 mg via INTRAVENOUS
  Filled 2021-07-19: qty 2

## 2021-07-19 MED ORDER — PANTOPRAZOLE SODIUM 40 MG IV SOLR
40.0000 mg | Freq: Once | INTRAVENOUS | Status: AC
  Administered 2021-07-19: 40 mg via INTRAVENOUS
  Filled 2021-07-19: qty 10

## 2021-07-19 NOTE — ED Triage Notes (Signed)
Pt c/o generalized abdominal pain that started around 11am after eating a burger.  ? ?C/o N/V and diarrhea  ? ?8/10 pain  ? ?A/Ox4 ?Ambulatory in triage  ?

## 2021-07-19 NOTE — ED Notes (Signed)
Patient drank a beverage. No vomiting noted. ?

## 2021-07-19 NOTE — ED Provider Notes (Signed)
?[facility]-EMERGENCY DEPT ?Provider Note ? ? ?CSN: [id number] ?Arrival date & time: 07/19/21  1547 ? ?  ? ?History ? ?Chief Complaint  ?Patient presents with  ? Abdominal Pain  ? ? ?Daniel PoreJohn A Kirtley is a 56 y.o. male.  Presented to the emergency department with concern for abdominal pain.  Started around 11 AM after eating a burger.  Has been relatively constant since, upper, cramping, 8 out of 10 in severity, some associated vomiting, nonbloody nonbilious.  Burning at times.  Similar to gastric reflux but more intense than prior.  Has tried over-the-counter meds with no relief.  Denies any major medical problems. ? ?HPI ? ?  ? ?Home Medications ?Prior to Admission medications   ?Medication Sig Start Date End Date Taking? Authorizing Provider  ?dicyclomine (BENTYL) 20 MG tablet Take 1 tablet (20 mg total) by mouth 4 (four) times daily -  before meals and at bedtime. 12/25/18   Wieters, Hallie C, PA-C  ?meclizine (ANTIVERT) 12.5 MG tablet Take 1 tablet (12.5 mg total) by mouth 3 (three) times daily as needed for dizziness or nausea. 12/25/18   Wieters, Hallie C, PA-C  ?naproxen (NAPROSYN) 375 MG tablet Take 1 tablet (375 mg total) by mouth 2 (two) times daily. 12/07/20   Teressa LowerFleming, Conner M, PA-C  ?omeprazole (PRILOSEC) 20 MG capsule Take 1 capsule (20 mg total) by mouth daily for 14 days. 12/25/18 01/08/19  Wieters, Hallie C, PA-C  ?ondansetron (ZOFRAN ODT) 4 MG disintegrating tablet Take 1 tablet (4 mg total) by mouth every 8 (eight) hours as needed for nausea or vomiting. 07/19/21   Milagros Lollykstra, Donne Robillard S, MD  ?ondansetron (ZOFRAN) 4 MG tablet Take 1 tablet (4 mg total) by mouth 2 (two) times daily. 01/05/19   Rachael FeeJacobs, Daniel P, MD  ?polyethylene glycol-electrolytes (NULYTELY/GOLYTELY) 420 g solution Take 4,000 mLs by mouth as directed. 01/05/19   Rachael FeeJacobs, Daniel P, MD  ?promethazine (PHENERGAN) 25 MG suppository Place 1 suppository (25 mg total) rectally every 6 (six) hours as needed for nausea or vomiting. 07/20/21    Melene PlanFloyd, Dan, DO  ?   ? ?Allergies    ?Patient has no known allergies.   ? ?Review of Systems   ?Review of Systems  ?Constitutional:  Positive for fatigue. Negative for chills and fever.  ?HENT:  Negative for ear pain and sore throat.   ?Eyes:  Negative for pain and visual disturbance.  ?Respiratory:  Negative for cough and shortness of breath.   ?Cardiovascular:  Negative for chest pain and palpitations.  ?Gastrointestinal:  Positive for abdominal pain, nausea and vomiting.  ?Genitourinary:  Negative for dysuria and hematuria.  ?Musculoskeletal:  Negative for arthralgias and back pain.  ?Skin:  Negative for color change and rash.  ?Neurological:  Negative for seizures and syncope.  ?All other systems reviewed and are negative. ? ?Physical Exam ?Updated Vital Signs ?BP (!) 182/87   Pulse 68   Temp 98.9 ?F (37.2 ?C) (Oral)   Resp 18   SpO2 97%  ?Physical Exam ?Vitals and nursing note reviewed.  ?Constitutional:   ?   General: He is not in acute distress. ?   Appearance: He is well-developed.  ?HENT:  ?   Head: Normocephalic and atraumatic.  ?Eyes:  ?   Conjunctiva/sclera: Conjunctivae normal.  ?Cardiovascular:  ?   Rate and Rhythm: Normal rate and regular rhythm.  ?   Heart sounds: No murmur heard. ?Pulmonary:  ?   Effort: Pulmonary effort is normal. No respiratory distress.  ?  Breath sounds: Normal breath sounds.  ?Abdominal:  ?   Palpations: Abdomen is soft.  ?   Tenderness: There is abdominal tenderness in the right upper quadrant, epigastric area and left upper quadrant. There is no guarding or rebound.  ?Musculoskeletal:     ?   General: No swelling.  ?   Cervical back: Neck supple.  ?Skin: ?   General: Skin is warm and dry.  ?   Capillary Refill: Capillary refill takes less than 2 seconds.  ?Neurological:  ?   Mental Status: He is alert.  ?Psychiatric:     ?   Mood and Affect: Mood normal.  ? ? ?ED Results / Procedures / Treatments   ?Labs ?(all labs ordered are listed, but only abnormal results are  displayed) ?Labs Reviewed  ?COMPREHENSIVE METABOLIC PANEL - Abnormal; Notable for the following components:  ?    Result Value  ? Total Protein 8.2 (*)   ? All other components within normal limits  ?CBC - Abnormal; Notable for the following components:  ? WBC 14.8 (*)   ? All other components within normal limits  ?URINALYSIS, ROUTINE W REFLEX MICROSCOPIC - Abnormal; Notable for the following components:  ? APPearance CLOUDY (*)   ? Protein, ur 30 (*)   ? Leukocytes,Ua SMALL (*)   ? All other components within normal limits  ?LIPASE, BLOOD  ? ? ?EKG ?None ? ?Radiology ?CT ABDOMEN PELVIS W CONTRAST ? ?Result Date: 07/19/2021 ?CLINICAL DATA:  Abdominal pain after eating a hamburger earlier today, with nausea. EXAM: CT ABDOMEN AND PELVIS WITH CONTRAST TECHNIQUE: Multidetector CT imaging of the abdomen and pelvis was performed using the standard protocol following bolus administration of intravenous contrast. RADIATION DOSE REDUCTION: This exam was performed according to the departmental dose-optimization program which includes automated exposure control, adjustment of the mA and/or kV according to patient size and/or use of iterative reconstruction technique. CONTRAST:  OMNIPAQUE IOHEXOL 300 MG/ML  SOLN COMPARISON:  CT with IV contrast 11/11/2018, CT with IV and oral contrast 11/11/2012 FINDINGS: Lower chest: There are small scattered areas of peribronchovascular clustered microcystic change in the left-greater-than-right lower lobes, 2 small peripheral areas of similar microcystic change in the right middle lobe. These findings were seen previously probably reflecting sequela of either a chronic inflammatory process or post pneumonic residua. Lung bases demonstrate no acute process. The cardiac size is normal. Hepatobiliary: Liver again demonstrates several scattered tiny hypodensities which are too small to characterize which show no interval change in appearance and numbers. There is no mass enhancement. The  gallbladder and bile ducts are unremarkable. Pancreas: No focal abnormality or ductal dilatation. The peripancreatic fat planes are not well seen due to a general paucity of body fat but there is no peripancreatic fluid. Spleen: Unremarkable. Adrenals/Urinary Tract: No focal adrenal or renal cortical mass enhancement. Small cyst or caliceal diverticulum again noted in the parapelvic right lower pole as well as nonobstructing lower pole right renal caliceal stones up to 1.5 cm. No hydronephrosis, left intrarenal stones or ureteral stones are seen. The bladder thickness is normal. Stomach/Bowel: There are thickened folds in the stomach, mild thickened folds in the left abdominal small bowel. No small bowel dilatation or inflammation is seen. The appendix is distally obscured by overlapping structures but the proximal appendix is normal caliber. There is mild wall thickening or nondistention in the distal descending and sigmoid colon. Vascular/Lymphatic: Aortoiliac atherosclerosis again noted without aneurysm. No portal vein dilatation. No adenopathy is seen allowing for limited  body fat. Reproductive: Mild prominence of the prostate measuring 4.5 cm transversely. Other: Small amount of posterior deep pelvic ascites is again noted, similar to 2020. No free hemorrhage, abscess or free air is seen. Musculoskeletal: There is new demonstration of partially healed subacute fractures of the left L2 and L3 transverse processes. There is degenerative disc change with spondylosis at L5-S1. No acute osseous findings. IMPRESSION: 1. Descending and sigmoid colitis versus nondistention. 2. Gastroenteritis without evidence of bowel obstruction or inflammatory change. 3. Nonobstructive nephrolithiasis. 4. Small amount of ascites in the posterior deep pelvis, similar to 2020, nonspecific but unusual in males. 5. Nonobstructive right-sided nephrolithiasis. 6. Stable too small to characterize scattered hepatic hypodensities. No mass  enhancement. 7. Subacute partially healed transverse process fractures on the left at L2 and 3. 8. Appendix obscured distally but normal where seen. Electronically Signed   By: Almira Bar M.D.   On: 07/19/2021

## 2021-07-20 ENCOUNTER — Emergency Department (HOSPITAL_COMMUNITY)
Admission: EM | Admit: 2021-07-20 | Discharge: 2021-07-20 | Disposition: A | Discharge status: Home / Self Care | Payer: Self-pay | Attending: Emergency Medicine | Admitting: Emergency Medicine

## 2021-07-20 ENCOUNTER — Other Ambulatory Visit: Payer: Self-pay

## 2021-07-20 ENCOUNTER — Encounter (HOSPITAL_COMMUNITY): Payer: Self-pay

## 2021-07-20 DIAGNOSIS — R1084 Generalized abdominal pain: Secondary | ICD-10-CM | POA: Insufficient documentation

## 2021-07-20 DIAGNOSIS — R197 Diarrhea, unspecified: Secondary | ICD-10-CM | POA: Insufficient documentation

## 2021-07-20 DIAGNOSIS — R112 Nausea with vomiting, unspecified: Secondary | ICD-10-CM | POA: Insufficient documentation

## 2021-07-20 DIAGNOSIS — R61 Generalized hyperhidrosis: Secondary | ICD-10-CM | POA: Insufficient documentation

## 2021-07-20 LAB — CBC WITH DIFFERENTIAL/PLATELET
Abs Immature Granulocytes: 0.03 10*3/uL (ref 0.00–0.07)
Basophils Absolute: 0 10*3/uL (ref 0.0–0.1)
Basophils Relative: 0 %
Eosinophils Absolute: 0 10*3/uL (ref 0.0–0.5)
Eosinophils Relative: 0 %
HCT: 47.6 % (ref 39.0–52.0)
Hemoglobin: 15.8 g/dL (ref 13.0–17.0)
Immature Granulocytes: 0 %
Lymphocytes Relative: 6 %
Lymphs Abs: 0.7 10*3/uL (ref 0.7–4.0)
MCH: 31.9 pg (ref 26.0–34.0)
MCHC: 33.2 g/dL (ref 30.0–36.0)
MCV: 96.2 fL (ref 80.0–100.0)
Monocytes Absolute: 0.4 10*3/uL (ref 0.1–1.0)
Monocytes Relative: 4 %
Neutro Abs: 10.4 10*3/uL — ABNORMAL HIGH (ref 1.7–7.7)
Neutrophils Relative %: 90 %
Platelets: 275 10*3/uL (ref 150–400)
RBC: 4.95 MIL/uL (ref 4.22–5.81)
RDW: 13.3 % (ref 11.5–15.5)
WBC: 11.6 10*3/uL — ABNORMAL HIGH (ref 4.0–10.5)
nRBC: 0 % (ref 0.0–0.2)

## 2021-07-20 LAB — COMPREHENSIVE METABOLIC PANEL
ALT: 22 U/L (ref 0–44)
AST: 31 U/L (ref 15–41)
Albumin: 4.7 g/dL (ref 3.5–5.0)
Alkaline Phosphatase: 76 U/L (ref 38–126)
Anion gap: 12 (ref 5–15)
BUN: 13 mg/dL (ref 6–20)
CO2: 25 mmol/L (ref 22–32)
Calcium: 9.9 mg/dL (ref 8.9–10.3)
Chloride: 104 mmol/L (ref 98–111)
Creatinine, Ser: 0.84 mg/dL (ref 0.61–1.24)
GFR, Estimated: >60 mL/min (ref >60)
Glucose, Bld: 131 mg/dL — ABNORMAL HIGH (ref 70–99)
Potassium: 3.6 mmol/L (ref 3.5–5.1)
Sodium: 141 mmol/L (ref 135–145)
Total Bilirubin: 0.8 mg/dL (ref 0.3–1.2)
Total Protein: 8.9 g/dL — ABNORMAL HIGH (ref 6.5–8.1)

## 2021-07-20 LAB — LIPASE, BLOOD: Lipase: 59 U/L — ABNORMAL HIGH (ref 11–51)

## 2021-07-20 LAB — URINALYSIS, ROUTINE W REFLEX MICROSCOPIC
Bilirubin Urine: NEGATIVE
Glucose, UA: NEGATIVE mg/dL
Ketones, ur: 20 mg/dL — AB
Nitrite: NEGATIVE
Protein, ur: 100 mg/dL — AB
Specific Gravity, Urine: 1.026 (ref 1.005–1.030)
pH: 7 (ref 5.0–8.0)

## 2021-07-20 MED ORDER — CAPSAICIN 0.075 % EX CREA
TOPICAL_CREAM | Freq: Two times a day (BID) | CUTANEOUS | Status: DC
  Filled 2021-07-20: qty 60

## 2021-07-20 MED ORDER — LACTATED RINGERS IV BOLUS
1000.0000 mL | Freq: Once | INTRAVENOUS | Status: AC
  Administered 2021-07-20: 1000 mL via INTRAVENOUS

## 2021-07-20 MED ORDER — PROMETHAZINE HCL 25 MG RE SUPP: 25.0000 mg | Freq: Four times a day (QID) | RECTAL | 0 refills | Status: DC | PRN

## 2021-07-20 MED ORDER — ALUM & MAG HYDROXIDE-SIMETH 200-200-20 MG/5ML PO SUSP
30.0000 mL | Freq: Once | ORAL | Status: AC
  Administered 2021-07-20: 30 mL via ORAL
  Filled 2021-07-20: qty 30

## 2021-07-20 MED ORDER — DROPERIDOL 2.5 MG/ML IJ SOLN
1.2500 mg | Freq: Once | INTRAMUSCULAR | Status: AC
  Administered 2021-07-20: 1.25 mg via INTRAVENOUS
  Filled 2021-07-20: qty 2

## 2021-07-20 NOTE — ED Provider Notes (Signed)
?[facility]-EMERGENCY DEPT ?Provider Note ? ? ?CSN: [id number] ?Arrival date & time: 07/20/21  0437 ? ?  ? ?History ? ?Chief Complaint  ?Patient presents with  ? Abdominal Pain  ? Emesis  ? ? ?Daniel [name] is a 56 y.o. male. ? ?The history is provided by the patient and medical records.  ?Abdominal Pain ?Associated symptoms: vomiting   ?Emesis ?Associated symptoms: abdominal pain   ?Daniel [name] is a 56 y.o. male who presents to the Emergency Department complaining of abdominal pain.  He presents to the ED for evaluation of central abdominal pain that started around 11am yesterday after he ate a burger.  He has associated emesis that is clear in color, vomited 20+ times.  Has diarrhea - now improving.  No dysuria.  No fever.  Pain gets better with heat.  ? ?No known medical problems, no medications.  No prior abdominal surgeries.   ? ?Uses tobacco.  Occasional alcohol.  Uses marijuana daily.   ? ?Seen in the ED earlier for similar sxs, states sxs flared up after discharge.   ?  ? ?Home Medications ?Prior to Admission medications   ?Medication Sig Start Date End Date Taking? Authorizing Provider  ?dicyclomine (BENTYL) 20 MG tablet Take 1 tablet (20 mg total) by mouth 4 (four) times daily -  before meals and at bedtime. 12/25/18   Wieters, Hallie C, PA-C  ?meclizine (ANTIVERT) 12.5 MG tablet Take 1 tablet (12.5 mg total) by mouth 3 (three) times daily as needed for dizziness or nausea. 12/25/18   Wieters, Hallie C, PA-C  ?naproxen (NAPROSYN) 375 MG tablet Take 1 tablet (375 mg total) by mouth 2 (two) times daily. 12/07/20   Teressa LowerFleming, Conner M, PA-C  ?omeprazole (PRILOSEC) 20 MG capsule Take 1 capsule (20 mg total) by mouth daily for 14 days. 12/25/18 01/08/19  Wieters, Hallie C, PA-C  ?ondansetron (ZOFRAN ODT) 4 MG disintegrating tablet Take 1 tablet (4 mg total) by mouth every 8 (eight) hours as needed for nausea or vomiting. 07/19/21   Milagros Lollykstra, Richard S, MD  ?ondansetron (ZOFRAN) 4 MG tablet Take 1 tablet  (4 mg total) by mouth 2 (two) times daily. 01/05/19   Rachael FeeJacobs, Daniel P, MD  ?polyethylene glycol-electrolytes (NULYTELY/GOLYTELY) 420 g solution Take 4,000 mLs by mouth as directed. 01/05/19   Rachael FeeJacobs, Daniel P, MD  ?promethazine (PHENERGAN) 25 MG tablet Take 1 tablet (25 mg total) by mouth every 6 (six) hours as needed for nausea or vomiting. 11/13/18 12/25/18  Ward, Layla MawKristen N, DO  ?   ? ?Allergies    ?Patient has no known allergies.   ? ?Review of Systems   ?Review of Systems  ?Gastrointestinal:  Positive for abdominal pain and vomiting.  ?All other systems reviewed and are negative. ? ?Physical Exam ?Updated Vital Signs ?BP (!) 160/85 (BP Location: Left Arm)   Pulse (!) 58   Temp 98.3 ?F (36.8 ?C) (Oral)   Resp 17   Ht 5\' 8"  (1.727 m)   Wt 60.3 kg   SpO2 100%   BMI 20.22 kg/m?  ?Physical Exam ?Vitals and nursing note reviewed.  ?Constitutional:   ?   Appearance: He is well-developed. He is diaphoretic.  ?HENT:  ?   Head: Normocephalic and atraumatic.  ?Cardiovascular:  ?   Rate and Rhythm: Normal rate and regular rhythm.  ?Pulmonary:  ?   Effort: Pulmonary effort is normal. No respiratory distress.  ?Abdominal:  ?   Palpations: Abdomen is soft.  ?  Tenderness: There is abdominal tenderness. There is no guarding or rebound.  ?   Comments: Mild generalized abdominal tenderness  ?Musculoskeletal:     ?   General: No tenderness.  ?Skin: ?   General: Skin is warm.  ?Neurological:  ?   Mental Status: He is alert and oriented to person, place, and time.  ?Psychiatric:     ?   Behavior: Behavior normal.  ? ? ?ED Results / Procedures / Treatments   ?Labs ?(all labs ordered are listed, but only abnormal results are displayed) ?Labs Reviewed  ?CBC WITH DIFFERENTIAL/PLATELET - Abnormal; Notable for the following components:  ?    Result Value  ? WBC 11.6 (*)   ? Neutro Abs 10.4 (*)   ? All other components within normal limits  ?COMPREHENSIVE METABOLIC PANEL  ?LIPASE, BLOOD  ?URINALYSIS, ROUTINE W REFLEX MICROSCOPIC   ? ? ?EKG ?None ? ?Radiology ?CT ABDOMEN PELVIS W CONTRAST ? ?Result Date: 07/19/2021 ?CLINICAL DATA:  Abdominal pain after eating a hamburger earlier today, with nausea. EXAM: CT ABDOMEN AND PELVIS WITH CONTRAST TECHNIQUE: Multidetector CT imaging of the abdomen and pelvis was performed using the standard protocol following bolus administration of intravenous contrast. RADIATION DOSE REDUCTION: This exam was performed according to the departmental dose-optimization program which includes automated exposure control, adjustment of the mA and/or kV according to patient size and/or use of iterative reconstruction technique. CONTRAST:  OMNIPAQUE IOHEXOL 300 MG/ML  SOLN COMPARISON:  CT with IV contrast 11/11/2018, CT with IV and oral contrast 11/11/2012 FINDINGS: Lower chest: There are small scattered areas of peribronchovascular clustered microcystic change in the left-greater-than-right lower lobes, 2 small peripheral areas of similar microcystic change in the right middle lobe. These findings were seen previously probably reflecting sequela of either a chronic inflammatory process or post pneumonic residua. Lung bases demonstrate no acute process. The cardiac size is normal. Hepatobiliary: Liver again demonstrates several scattered tiny hypodensities which are too small to characterize which show no interval change in appearance and numbers. There is no mass enhancement. The gallbladder and bile ducts are unremarkable. Pancreas: No focal abnormality or ductal dilatation. The peripancreatic fat planes are not well seen due to a general paucity of body fat but there is no peripancreatic fluid. Spleen: Unremarkable. Adrenals/Urinary Tract: No focal adrenal or renal cortical mass enhancement. Small cyst or caliceal diverticulum again noted in the parapelvic right lower pole as well as nonobstructing lower pole right renal caliceal stones up to 1.5 cm. No hydronephrosis, left intrarenal stones or ureteral stones are  seen. The bladder thickness is normal. Stomach/Bowel: There are thickened folds in the stomach, mild thickened folds in the left abdominal small bowel. No small bowel dilatation or inflammation is seen. The appendix is distally obscured by overlapping structures but the proximal appendix is normal caliber. There is mild wall thickening or nondistention in the distal descending and sigmoid colon. Vascular/Lymphatic: Aortoiliac atherosclerosis again noted without aneurysm. No portal vein dilatation. No adenopathy is seen allowing for limited body fat. Reproductive: Mild prominence of the prostate measuring 4.5 cm transversely. Other: Small amount of posterior deep pelvic ascites is again noted, similar to 2020. No free hemorrhage, abscess or free air is seen. Musculoskeletal: There is new demonstration of partially healed subacute fractures of the left L2 and L3 transverse processes. There is degenerative disc change with spondylosis at L5-S1. No acute osseous findings. IMPRESSION: 1. Descending and sigmoid colitis versus nondistention. 2. Gastroenteritis without evidence of bowel obstruction or inflammatory change. 3. Nonobstructive  nephrolithiasis. 4. Small amount of ascites in the posterior deep pelvis, similar to 2020, nonspecific but unusual in males. 5. Nonobstructive right-sided nephrolithiasis. 6. Stable too small to characterize scattered hepatic hypodensities. No mass enhancement. 7. Subacute partially healed transverse process fractures on the left at L2 and 3. 8. Appendix obscured distally but normal where seen. Electronically Signed   By: Almira Bar M.D.   On: 07/19/2021 21:21  ? ?US Abdomen Limited RUQ (LIVER/GB) ? ?Result Date: 07/19/2021 ?CLINICAL DATA:  Right upper quadrant pain. EXAM: ULTRASOUND ABDOMEN LIMITED RIGHT UPPER QUADRANT COMPARISON:  CT abdomen and pelvis 11/11/2018. FINDINGS: Gallbladder: No gallstones or wall thickening visualized. Sludge is present. No sonographic Murphy sign noted by  sonographer. Common bile duct: Diameter: 3.5 mm. Liver: No focal lesion identified. Within normal limits in parenchymal echogenicity. Portal vein is patent on color Doppler imaging with normal direction of blood

## 2021-07-20 NOTE — ED Notes (Signed)
This RN attempting to get pt to drink the mylanta, pt laying in bed with his eyes shut. RN asked how the cream was working for his pain and he responded that it was helping a bit, but when asking him to sit up and drink the mylanta he acts if he is sleeping. Mylanta left at bedside and pt informed to drink it when he can sit up and do so.  ?

## 2021-07-20 NOTE — ED Notes (Signed)
D/c instructions reviewed w/ pt. Pt still lying in bed. Pt informed he needs to get dressed so he can go home. Pt continues to lay in bed.  ?

## 2021-07-20 NOTE — ED Triage Notes (Signed)
Pt reports with upper and lower abdominal pain and vomiting since yesterday. Pt reports that he was recently seen but nothing was done.  ?

## 2021-07-20 NOTE — ED Notes (Signed)
Unable to collect labs in triage ?

## 2021-07-20 NOTE — Discharge Instructions (Addendum)
There is no abnormality in your blood work today that was concerning.  Please follow-up with your family doctor in the office.  If you have recurrent nausea and vomiting then sometimes they will refer you to a gastroenterologist to assess for ulcer. ? ?Try pepcid or tagamet up to twice a day.  Try to avoid things that may make this worse, most commonly these are spicy foods tomato based products fatty foods chocolate and peppermint.  Alcohol and tobacco can also make this worse.  Return to the emergency department for sudden worsening pain fever or inability to eat or drink. ? ?

## 2021-07-20 NOTE — ED Provider Notes (Signed)
Received in signout from Dr. Madilyn Hook, briefly patient with recurrent nausea and vomiting.  Seems to only be relieved with warmth.  Had been seen yesterday and had CT imaging and blood work.  Plan to reassess post medications.  Patient tells me he is feeling better on reassessment.  Has been tolerating by mouth.  Will discharge home.  PCP follow-up. ? ? ?  ?Melene Plan, DO ?07/20/21 0913 ? ?

## 2021-08-02 ENCOUNTER — Encounter: Payer: Self-pay | Admitting: Physician Assistant

## 2021-08-02 NOTE — Progress Notes (Signed)
Pt seen by Dr Delford Field. ? ?He is living in [place] because his sons are here.  ?His mother lives here, but is currently in rehab after a fall.  ?Most of his other relatives have died, he feels he does not have anyone to lean on. ?He works at [employer], generally gets off a 6. Got off early to see [place] here.  ? ?He drinks on the weekends, 2- 40 oz at night.  ? ?He was in the ER on 04/06 w/ abd pain. He was actually seen twice that day, due to recurrent sx of N&V. He was given phenergan supp and disintegrating Zofran in the ER. ? ?He was also having problems w/ diarrhea, watery.  ? ?He was given Culterelle, 1 tab daily, and omeprazole 20 mg qd.   ? ?Had a CT, it showed gastroenteritis, colitis, R nephrolithiasis, L>R lower lobe microcystic changes, also in RML ? ?C/O tooth pain, has fractured R 3rd molar. We will refer him to the [facility].  ? ?He also has a callus on his L foot, toenails are too long. Has several callused areas on the sole of his foot. He will be referred to [facility].  ? ?He does not work on Fridays, can make appointments then. Needs help w/ transportation. ? ?Phone number is good. ? ?PW will send his name in, to get a PCP appt.  ? ?Theodore Demark, PA-C ?08/02/2021 ?5:32 PM ? ? ? ? ? ? ? ? ? ? ? ?

## 2021-08-23 ENCOUNTER — Ambulatory Visit: Payer: Self-pay | Admitting: Critical Care Medicine

## 2021-08-23 NOTE — Progress Notes (Deleted)
New Patient Office Visit  Subjective    Patient ID: Daniel [name], male    DOB: 05-Jun-1965  Age: 56 y.o. MRN: [id number]  CC: No chief complaint on file.   HPI Daniel [name] presents to establish care [facility] patient 07/2021 Pt seen by Dr Delford Field.   He is living in [place] because his sons are here.  His mother lives here, but is currently in rehab after a fall.  Most of his other relatives have died, he feels he does not have anyone to lean on. He works at [employer], generally gets off a 6. Got off early to see [place] here.    He drinks on the weekends, 2- 40 oz at night.    He was in the ER on 04/06 w/ abd pain. He was actually seen twice that day, due to recurrent sx of N&V. He was given phenergan supp and disintegrating Zofran in the ER.   He was also having problems w/ diarrhea, watery.    He was given Culterelle, 1 tab daily, and omeprazole 20 mg qd.     Had a CT, it showed gastroenteritis, colitis, R nephrolithiasis, L>R lower lobe microcystic changes, also in RML   C/O tooth pain, has fractured R 3rd molar. We will refer him to the [facility].    He also has a callus on his L foot, toenails are too long. Has several callused areas on the sole of his foot. He will be referred to [facility].    He does not work on Fridays, can make appointments then. Needs help w/ transportation.   Phone number is good.   PW will send his name in, to get a PCP appt.     Outpatient Encounter Medications as of 08/23/2021  Medication Sig   dicyclomine (BENTYL) 20 MG tablet Take 1 tablet (20 mg total) by mouth 4 (four) times daily -  before meals and at bedtime.   meclizine (ANTIVERT) 12.5 MG tablet Take 1 tablet (12.5 mg total) by mouth 3 (three) times daily as needed for dizziness or nausea.   naproxen (NAPROSYN) 375 MG tablet Take 1 tablet (375 mg total) by mouth 2 (two) times daily.   omeprazole (PRILOSEC) 20 MG capsule Take 1 capsule (20 mg total) by mouth daily for 14  days.   ondansetron (ZOFRAN ODT) 4 MG disintegrating tablet Take 1 tablet (4 mg total) by mouth every 8 (eight) hours as needed for nausea or vomiting.   ondansetron (ZOFRAN) 4 MG tablet Take 1 tablet (4 mg total) by mouth 2 (two) times daily.   polyethylene glycol-electrolytes (NULYTELY/GOLYTELY) 420 g solution Take 4,000 mLs by mouth as directed.   promethazine (PHENERGAN) 25 MG suppository Place 1 suppository (25 mg total) rectally every 6 (six) hours as needed for nausea or vomiting.   No facility-administered encounter medications on file as of 08/23/2021.    Past Medical History:  Diagnosis Date   Anxiety    Chronic lower back pain    Depression    Internal hemorrhoids with other complication    Rectal abscess     Past Surgical History:  Procedure Laterality Date   HERNIA REPAIR     INCISION AND DRAINAGE PERIRECTAL ABSCESS  10/2010    Family History  Problem Relation Age of Onset   Cancer Sister    HIV/AIDS Father    Stomach cancer Brother     Social History   Socioeconomic History   Marital status: Single  Spouse name: Not on file   Number of children: Not on file   Years of education: Not on file   Highest education level: Not on file  Occupational History   Not on file  Tobacco Use   Smoking status: Every Day    Packs/day: 0.75    Years: 28.00    Pack years: 21.00    Types: Cigarettes   Smokeless tobacco: Former  Building services engineer Use: Never used  Substance and Sexual Activity   Alcohol use: Yes   Drug use: Yes    Types: Marijuana   Sexual activity: Not Currently  Other Topics Concern   Not on file  Social History Narrative   Not on file   Social Determinants of Health   Financial Resource Strain: Not on file  Food Insecurity: Not on file  Transportation Needs: Not on file  Physical Activity: Not on file  Stress: Not on file  Social Connections: Not on file  Intimate Partner Violence: Not on file    ROS      Objective    There  were no vitals taken for this visit.  Physical Exam  {Labs (Optional):23779}    Assessment & Plan:   Problem List Items Addressed This Visit   None   No follow-ups on file.   Shan Levans, MD

## 2021-11-29 ENCOUNTER — Other Ambulatory Visit: Payer: Self-pay | Admitting: Critical Care Medicine

## 2021-11-29 ENCOUNTER — Encounter: Payer: Self-pay | Admitting: Critical Care Medicine

## 2021-11-29 MED ORDER — DICYCLOMINE HCL 20 MG PO TABS: 20.0000 mg | ORAL_TABLET | Freq: Three times a day (TID) | ORAL | 0 refills | Status: DC

## 2021-11-29 MED ORDER — ONDANSETRON 4 MG PO TBDP: 4.0000 mg | ORAL_TABLET | Freq: Three times a day (TID) | ORAL | 0 refills | Status: DC | PRN

## 2021-11-29 MED ORDER — OMEPRAZOLE 20 MG PO CPDR: 20.0000 mg | DELAYED_RELEASE_CAPSULE | Freq: Every day | ORAL | 0 refills | Status: DC

## 2021-11-30 NOTE — Progress Notes (Signed)
This is a 56 year old male seen at the [facility] we saw him earlier this year in April and he left to go take care of his elderly mother in [place] and is now returned to the shelter.  He has prior history of chronic gastritis and abdominalComplaints he also has a fractured right lower posterior molar with his significant pain.  He is drinking alcohol to 40 ounces every weekend.  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  On arrival blood pressure 149/94  On exam the right lower jaw shows fractured third molar with some impaction effect chest is clear abdomen is benign  Impression is that a prior history of gastritis and irritable bowel syndrome  Fractured tooth with impacted molar  Plan is to refer to urgent tooth he has a 7 AM appointment in the morning  We will get him back into health and wellness to establish for primary care  We sent to [facility] pharmacy omeprazole 20 mg daily also Zofran as needed

## 2021-12-02 ENCOUNTER — Other Ambulatory Visit: Payer: Self-pay

## 2021-12-02 ENCOUNTER — Emergency Department (HOSPITAL_COMMUNITY)
Admission: EM | Admit: 2021-12-02 | Discharge: 2021-12-02 | Disposition: A | Discharge status: Home / Self Care | Payer: Self-pay | Attending: Emergency Medicine | Admitting: Emergency Medicine

## 2021-12-02 DIAGNOSIS — R112 Nausea with vomiting, unspecified: Secondary | ICD-10-CM | POA: Insufficient documentation

## 2021-12-02 DIAGNOSIS — R1013 Epigastric pain: Secondary | ICD-10-CM | POA: Insufficient documentation

## 2021-12-02 DIAGNOSIS — F17210 Nicotine dependence, cigarettes, uncomplicated: Secondary | ICD-10-CM | POA: Insufficient documentation

## 2021-12-02 LAB — CBC WITH DIFFERENTIAL/PLATELET
Abs Immature Granulocytes: 0.04 10*3/uL (ref 0.00–0.07)
Basophils Absolute: 0 10*3/uL (ref 0.0–0.1)
Basophils Relative: 0 %
Eosinophils Absolute: 0 10*3/uL (ref 0.0–0.5)
Eosinophils Relative: 0 %
HCT: 43.7 % (ref 39.0–52.0)
Hemoglobin: 14.7 g/dL (ref 13.0–17.0)
Immature Granulocytes: 0 %
Lymphocytes Relative: 7 %
Lymphs Abs: 0.8 10*3/uL (ref 0.7–4.0)
MCH: 32 pg (ref 26.0–34.0)
MCHC: 33.6 g/dL (ref 30.0–36.0)
MCV: 95.2 fL (ref 80.0–100.0)
Monocytes Absolute: 0.4 10*3/uL (ref 0.1–1.0)
Monocytes Relative: 4 %
Neutro Abs: 9.8 10*3/uL — ABNORMAL HIGH (ref 1.7–7.7)
Neutrophils Relative %: 89 %
Platelets: 253 10*3/uL (ref 150–400)
RBC: 4.59 MIL/uL (ref 4.22–5.81)
RDW: 13.3 % (ref 11.5–15.5)
WBC: 11 10*3/uL — ABNORMAL HIGH (ref 4.0–10.5)
nRBC: 0 % (ref 0.0–0.2)

## 2021-12-02 LAB — COMPREHENSIVE METABOLIC PANEL
ALT: 18 U/L (ref 0–44)
AST: 26 U/L (ref 15–41)
Albumin: 4.4 g/dL (ref 3.5–5.0)
Alkaline Phosphatase: 74 U/L (ref 38–126)
Anion gap: 11 (ref 5–15)
BUN: 10 mg/dL (ref 6–20)
CO2: 24 mmol/L (ref 22–32)
Calcium: 9.1 mg/dL (ref 8.9–10.3)
Chloride: 108 mmol/L (ref 98–111)
Creatinine, Ser: 0.78 mg/dL (ref 0.61–1.24)
GFR, Estimated: >60 mL/min (ref >60)
Glucose, Bld: 111 mg/dL — ABNORMAL HIGH (ref 70–99)
Potassium: 3.7 mmol/L (ref 3.5–5.1)
Sodium: 143 mmol/L (ref 135–145)
Total Bilirubin: 0.5 mg/dL (ref 0.3–1.2)
Total Protein: 8.6 g/dL — ABNORMAL HIGH (ref 6.5–8.1)

## 2021-12-02 LAB — URINALYSIS, ROUTINE W REFLEX MICROSCOPIC
Bacteria, UA: NONE SEEN
Bilirubin Urine: NEGATIVE
Glucose, UA: NEGATIVE mg/dL
Ketones, ur: 5 mg/dL — AB
Nitrite: NEGATIVE
Protein, ur: 30 mg/dL — AB
Specific Gravity, Urine: 1.012 (ref 1.005–1.030)
pH: 7 (ref 5.0–8.0)

## 2021-12-02 LAB — LIPASE, BLOOD: Lipase: 27 U/L (ref 11–51)

## 2021-12-02 MED ORDER — LACTATED RINGERS IV BOLUS
1000.0000 mL | Freq: Once | INTRAVENOUS | Status: AC
  Administered 2021-12-02: 1000 mL via INTRAVENOUS

## 2021-12-02 MED ORDER — DICYCLOMINE HCL 10 MG/5ML PO SOLN
10.0000 mg | Freq: Once | ORAL | Status: AC
  Administered 2021-12-02: 10 mg via ORAL
  Filled 2021-12-02: qty 5

## 2021-12-02 MED ORDER — ALUM & MAG HYDROXIDE-SIMETH 200-200-20 MG/5ML PO SUSP
30.0000 mL | Freq: Once | ORAL | Status: DC
Start: 2021-12-02 — End: 2021-12-02
  Filled 2021-12-02: qty 30

## 2021-12-02 MED ORDER — MORPHINE SULFATE (PF) 4 MG/ML IV SOLN
4.0000 mg | Freq: Once | INTRAVENOUS | Status: AC
  Administered 2021-12-02: 4 mg via INTRAVENOUS
  Filled 2021-12-02: qty 1

## 2021-12-02 MED ORDER — METOCLOPRAMIDE HCL 5 MG/ML IJ SOLN
10.0000 mg | Freq: Once | INTRAMUSCULAR | Status: AC
  Administered 2021-12-02: 10 mg via INTRAVENOUS
  Filled 2021-12-02: qty 2

## 2021-12-02 NOTE — ED Notes (Signed)
Pt still needs CBC

## 2021-12-02 NOTE — ED Provider Notes (Signed)
[facility]-EMERGENCY DEPT Provider Note   CSN: [id number] Arrival date & time: 12/02/21  0221     History  Chief Complaint  Patient presents with   Abdominal Pain   Emesis    Daniel [name] is a 56 y.o. male with history of anxiety, depression, chronic gastritis and abdominal pain who presents emergency department for evaluation of abdominal pain with nausea and bloating that started at about 9 PM last night.  Pain is epigastric and quite severe.  Denies blood or coffee-ground emesis.  He called EMS who gave him fentanyl 50 mcg along with Zofran 4 mg with temporary improvement in symptoms although at my evaluation medication had worn off.  Patient states that this is happened before and he is actually scheduled for endoscopy on 12/20/2021 to evaluate for cause of his abdominal pain.  Denies fevers, chills, shortness of breath, chest pain, dysuria and diarrhea.  He receives care at the [facility] and was most recently seen 3 days ago and had refill for omeprazole 20 mg and Zofran ordered.   Abdominal Pain Associated symptoms: nausea and vomiting   Associated symptoms: no chest pain, no diarrhea, no dysuria, no fever and no shortness of breath   Emesis Associated symptoms: abdominal pain   Associated symptoms: no diarrhea and no fever        Home Medications Prior to Admission medications   Medication Sig Start Date End Date Taking? Authorizing Provider  dicyclomine (BENTYL) 20 MG tablet Take 1 tablet (20 mg total) by mouth 4 (four) times daily -  before meals and at bedtime. 11/29/21   [name], MD  meclizine (ANTIVERT) 12.5 MG tablet Take 1 tablet (12.5 mg total) by mouth 3 (three) times daily as needed for dizziness or nausea. 12/25/18   Wieters, Hallie C, PA-C  naproxen (NAPROSYN) 375 MG tablet Take 1 tablet (375 mg total) by mouth 2 (two) times daily. 12/07/20   Honor Loh M, PA-C  omeprazole (PRILOSEC) 20 MG capsule Take 1 capsule (20 mg  total) by mouth daily for 14 days. 11/29/21 12/13/21  [name], MD  ondansetron (ZOFRAN ODT) 4 MG disintegrating tablet Take 1 tablet (4 mg total) by mouth every 8 (eight) hours as needed for nausea or vomiting. 11/29/21   [name], MD  ondansetron (ZOFRAN) 4 MG tablet Take 1 tablet (4 mg total) by mouth 2 (two) times daily. 01/05/19   Rachael Fee, MD  polyethylene glycol-electrolytes (NULYTELY/GOLYTELY) 420 g solution Take 4,000 mLs by mouth as directed. 01/05/19   Rachael Fee, MD  promethazine (PHENERGAN) 25 MG suppository Place 1 suppository (25 mg total) rectally every 6 (six) hours as needed for nausea or vomiting. 07/20/21   Melene Plan, DO      Allergies    Patient has no known allergies.    Review of Systems   Review of Systems  Constitutional:  Negative for fever.  Respiratory:  Negative for shortness of breath.   Cardiovascular:  Negative for chest pain.  Gastrointestinal:  Positive for abdominal pain, nausea and vomiting. Negative for blood in stool and diarrhea.  Genitourinary:  Negative for dysuria.    Physical Exam Updated Vital Signs BP 129/72   Pulse (!) 56   Temp 97.7 F (36.5 C) (Oral)   Resp 17   SpO2 98%  Physical Exam Vitals and nursing note reviewed.  Constitutional:      General: He is in acute distress.     Appearance:  He is not ill-appearing.     Comments: Moaning and writhing in pain  HENT:     Head: Atraumatic.  Eyes:     Conjunctiva/sclera: Conjunctivae normal.  Cardiovascular:     Rate and Rhythm: Normal rate and regular rhythm.     Pulses: Normal pulses.     Heart sounds: No murmur heard. Pulmonary:     Effort: Pulmonary effort is normal. No respiratory distress.     Breath sounds: Normal breath sounds.  Abdominal:     General: Abdomen is flat. There is no distension.     Palpations: Abdomen is soft.     Tenderness: There is abdominal tenderness in the epigastric area. There is no right CVA tenderness or left CVA  tenderness. Negative signs include Murphy's sign and McBurney's sign.  Musculoskeletal:        General: Normal range of motion.     Cervical back: Normal range of motion.  Skin:    General: Skin is warm and dry.     Capillary Refill: Capillary refill takes less than 2 seconds.  Neurological:     General: No focal deficit present.     Mental Status: He is alert.  Psychiatric:        Mood and Affect: Mood normal.     ED Results / Procedures / Treatments   Labs (all labs ordered are listed, but only abnormal results are displayed) Labs Reviewed  COMPREHENSIVE METABOLIC PANEL - Abnormal; Notable for the following components:      Result Value   Glucose, Bld 111 (*)    Total Protein 8.6 (*)    All other components within normal limits  URINALYSIS, ROUTINE W REFLEX MICROSCOPIC - Abnormal; Notable for the following components:   Color, Urine STRAW (*)    Hgb urine dipstick SMALL (*)    Ketones, ur 5 (*)    Protein, ur 30 (*)    Leukocytes,Ua TRACE (*)    All other components within normal limits  CBC WITH DIFFERENTIAL/PLATELET - Abnormal; Notable for the following components:   WBC 11.0 (*)    Neutro Abs 9.8 (*)    All other components within normal limits  LIPASE, BLOOD    EKG None  Radiology No results found.  Procedures Procedures    Medications Ordered in ED Medications  alum & mag hydroxide-simeth (MAALOX/MYLANTA) 200-200-20 MG/5ML suspension 30 mL (30 mLs Oral Patient Refused/Not Given 12/02/21 0753)  lactated ringers bolus 1,000 mL (0 mLs Intravenous Stopped 12/02/21 1050)  morphine (PF) 4 MG/ML injection 4 mg (4 mg Intravenous Given 12/02/21 0743)  metoCLOPramide (REGLAN) injection 10 mg (10 mg Intravenous Given 12/02/21 0745)  dicyclomine (BENTYL) 10 MG/5ML solution 10 mg (10 mg Oral Given 12/02/21 0815)    ED Course/ Medical Decision Making/ A&P                           Medical Decision Making Amount and/or Complexity of Data Reviewed Labs:  ordered.  Risk OTC drugs. Prescription drug management.   Social determinants of health:  Social History   Socioeconomic History   Marital status: Single    Spouse name: Not on file   Number of children: Not on file   Years of education: Not on file   Highest education level: Not on file  Occupational History   Not on file  Tobacco Use   Smoking status: Every Day    Packs/day: 0.75    Years: 28.00  Total pack years: 21.00    Types: Cigarettes   Smokeless tobacco: Former  Building services engineer Use: Never used  Substance and Sexual Activity   Alcohol use: Yes   Drug use: Yes    Types: Marijuana   Sexual activity: Not Currently  Other Topics Concern   Not on file  Social History Narrative   Not on file   Social Determinants of Health   Financial Resource Strain: Not on file  Food Insecurity: Not on file  Transportation Needs: Not on file  Physical Activity: Not on file  Stress: Not on file  Social Connections: Not on file  Intimate Partner Violence: Not on file     Initial impression:  This patient presents to the ED for concern of epigastric pain with nausea and vomiting, this involves an extensive number of treatment options, and is a complaint that carries with it a high risk of complications and morbidity.   Differentials include The differential diagnosis for generalized abdominal pain includes, but is not limited to AAA, gastroenteritis, appendicitis, Bowel obstruction, Bowel perforation. Gastroparesis, DKA, Hernia, Inflammatory bowel disease, mesenteric ischemia, pancreatitis, peritonitis SBP  Comorbidities affecting care:  Chronic gastritis and abdominal pain  Additional history obtained: Family medicine records  Lab Tests  I Ordered, reviewed, and interpreted labs and EKG.  The pertinent results include:  CMP normal, negative lipase UA with evidence of dehydration  Medicines ordered and prescription drug management:  I ordered medication  including: Reglan 10mg  IV LR bolus Morphine 4mg    GI cocktail with Bentyl Reevaluation of the patient after these medicines showed that the patient improved I have reviewed the patients home medicines and have made adjustments as needed   ED Course/Re-evaluation: Presents acutely distressed, moaning and writhing in pain.  On exam, vitals are significant for elevated blood pressure although he is satting well on room air.  He has tenderness to palpation in the epigastric region in a bandlike pattern.  He had been given medications via EMS several hours prior to my evaluation which have worn off.  I ordered labs which were overall very reassuring although urinalysis was concerning for some dehydration given presence of ketones and protein.  He had mild leukocytosis although this is at around his baseline.  I gave him Reglan 10 mg, morphine, Bentyl and 1 L LR bolus.  On reevaluation, patient was resting comfortably in bed.  His blood pressure had normalized and he reports feeling much better. Upon chart review, I saw that patient has been recently prescribed Zofran and omeprazole.  Patient states that he is unable to afford the medications at this time and should be able to pick them up fairly soon.  Advised using good [facility] to help with coupons and discounts in order to pick up his medications.  Strongly encouraged him to maintain appointment with PCP for endoscopy on September 6.  Patient expresses understanding and is amenable to plan.  Disposition:  After consideration of the diagnostic results, physical exam, history and the patients response to treatment feel that the patent would benefit from discharge.   Epigastric pain Nausea and vomiting: Plan and management as described above. Discharged home in good condition. Final Clinical Impression(s) / ED Diagnoses Final diagnoses:  Epigastric pain  Nausea and vomiting, unspecified vomiting type    Rx / DC Orders ED Discharge Orders     None          , PA-C 12/02/21 1122    Janell Quiet,  MD 12/02/21 1654

## 2021-12-02 NOTE — ED Triage Notes (Signed)
Patient BIB EMS for evaluation of generalized abdominal pain and vomiting.  Reports pain started tonight.  Given Fentanyl 50 mcg IV and Zofran 4 mg IV PTA.  No reports of diarrhea

## 2021-12-02 NOTE — ED Notes (Signed)
CBC collected and sent to lab.

## 2021-12-02 NOTE — Discharge Instructions (Signed)
I am glad you are feeling better after treatment here today.  Please maintain your appointment with your primary care doctor to receive your endoscopy in a couple of weeks as this is very important.  If you are having trouble affording your medications, I would also look on good [email or web address] and type and the name of the medications prescribed as you will have significant discounts and coupons to give the pharmacy

## 2021-12-02 NOTE — ED Notes (Signed)
Pt given bus pass ?

## 2021-12-04 ENCOUNTER — Other Ambulatory Visit: Payer: Self-pay

## 2021-12-04 ENCOUNTER — Telehealth: Payer: Self-pay | Admitting: Critical Care Medicine

## 2021-12-04 MED ORDER — DICYCLOMINE HCL 20 MG PO TABS
20.0000 mg | ORAL_TABLET | Freq: Three times a day (TID) | ORAL | 0 refills | Status: DC
  Filled 2021-12-04: qty 20, 5d supply, fill #0

## 2021-12-04 MED ORDER — OMEPRAZOLE 20 MG PO CPDR
20.0000 mg | DELAYED_RELEASE_CAPSULE | Freq: Every day | ORAL | 0 refills | Status: DC
  Filled 2021-12-04: qty 30, 30d supply, fill #0

## 2021-12-04 MED ORDER — ONDANSETRON HCL 4 MG PO TABS
4.0000 mg | ORAL_TABLET | Freq: Two times a day (BID) | ORAL | 4 refills | Status: DC
  Filled 2021-12-04: qty 60, 30d supply, fill #0

## 2021-12-04 NOTE — Telephone Encounter (Signed)
Patient called in stating the prescription the provider wrote him on Wednesday at the [facility] he cannot afford. He thought he had medicaid but it does not cover his prescription. He wonders if there is a generic form he maybe able to afford. He is not sure of the name of the medication but it is for his stomach and his tooth. The patient uses  [facility] [address] ([place]), [place] - [address] Phone:  [phone]  Fax:  [phone]-     Please assist patient further

## 2021-12-04 NOTE — Telephone Encounter (Signed)
I tried to call the patient and left a voicemail try to communicate with this patient and tell him I have sent his prescriptions to our pharmacy and given directions as to how to get here he will get a better price there is any will for [facility] the medicines I am sending are generic and should be very low cost to him  Also let him know this for his tooth he was given a referral to urgent tooth it was a last week he can call urgent tooth and get another appointment time if he missed that appointment

## 2021-12-04 NOTE — Telephone Encounter (Signed)
Routing to MD.

## 2021-12-05 NOTE — Telephone Encounter (Signed)
Called patient (Both numbers) no answer, voicemail box full

## 2021-12-06 NOTE — Telephone Encounter (Signed)
Called patient and left voicemail.

## 2021-12-11 ENCOUNTER — Other Ambulatory Visit: Payer: Self-pay

## 2021-12-19 NOTE — Progress Notes (Signed)
New Patient Office Visit  Subjective    Patient ID: Daniel [name], male    DOB: 02/12/1966  Age: 56 y.o. MRN: [id number]  CC: No chief complaint on file.   HPI Daniel [name] presents to establish care  8/16 This is a 56 year old male seen at the [facility] we saw him earlier this year in April and he left to go take care of his elderly mother in [place] and is now returned to the shelter.  He has prior history of chronic gastritis and abdominalComplaints he also has a fractured right lower posterior molar with his significant pain.  He is drinking alcohol to 40 ounces every weekend.  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  On arrival blood pressure 149/94   On exam the right lower jaw shows fractured third molar with some impaction effect chest is clear abdomen is benign   Impression is that a prior history of gastritis and irritable bowel syndrome   Fractured tooth with impacted molar   Plan is to refer to urgent tooth he has a 7 AM appointment in the morning   We will get him back into health and wellness to establish for primary care   We sent to [facility] pharmacy omeprazole 20 mg daily also Zofran as needed  Outpatient Encounter Medications as of 12/20/2021  Medication Sig  . dicyclomine (BENTYL) 20 MG tablet Take 1 tablet (20 mg total) by mouth 4 (four) times daily -  before meals and at bedtime.  . meclizine (ANTIVERT) 12.5 MG tablet Take 1 tablet (12.5 mg total) by mouth 3 (three) times daily as needed for dizziness or nausea.  Marland Kitchen omeprazole (PRILOSEC) 20 MG capsule Take 1 capsule (20 mg total) by mouth daily for 14 days.  . ondansetron (ZOFRAN) 4 MG tablet Take 1 tablet (4 mg total) by mouth 2 (two) times daily.  . polyethylene glycol-electrolytes (NULYTELY/GOLYTELY) 420 g solution Take 4,000 mLs by mouth as directed.  . promethazine (PHENERGAN) 25 MG suppository Place 1  suppository (25 mg total) rectally every 6 (six) hours as needed for nausea or vomiting.   No facility-administered encounter medications on file as of 12/20/2021.    Past Medical History:  Diagnosis Date  . Anxiety   . Chronic lower back pain   . Depression   . Internal hemorrhoids with other complication   . Rectal abscess     Past Surgical History:  Procedure Laterality Date  . HERNIA REPAIR    . INCISION AND DRAINAGE PERIRECTAL ABSCESS  10/2010    Family History  Problem Relation Age of Onset  . Cancer Sister   . HIV/AIDS Father   . Stomach cancer Brother     Social History   Socioeconomic History  . Marital status: Single    Spouse name: Not on file  . Number of children: Not on file  . Years of education: Not on file  . Highest education level: Not on file  Occupational History  . Not on file  Tobacco Use  . Smoking status: Every Day    Packs/day: 0.75    Years: 28.00    Total pack years: 21.00    Types: Cigarettes  . Smokeless tobacco: Former  Advertising account planner  . Vaping Use: Never used  Substance and Sexual Activity  . Alcohol use: Yes  . Drug use: Yes    Types: Marijuana  .  Sexual activity: Not Currently  Other Topics Concern  . Not on file  Social History Narrative  . Not on file   Social Determinants of Health   Financial Resource Strain: Not on file  Food Insecurity: Not on file  Transportation Needs: Not on file  Physical Activity: Not on file  Stress: Not on file  Social Connections: Not on file  Intimate Partner Violence: Not on file    ROS      Objective    There were no vitals taken for this visit.  Physical Exam  {Labs (Optional):23779}    Assessment & Plan:   Problem List Items Addressed This Visit   None   No follow-ups on file.   Shan Levans, MD

## 2021-12-20 ENCOUNTER — Encounter: Payer: Self-pay | Admitting: Critical Care Medicine

## 2021-12-20 ENCOUNTER — Other Ambulatory Visit: Payer: Self-pay

## 2021-12-20 ENCOUNTER — Ambulatory Visit: Payer: Self-pay | Attending: Critical Care Medicine | Admitting: Critical Care Medicine

## 2021-12-20 ENCOUNTER — Encounter: Payer: Self-pay | Admitting: Emergency Medicine

## 2021-12-20 ENCOUNTER — Encounter: Payer: Self-pay | Admitting: Physician Assistant

## 2021-12-20 VITALS — BP 143/92 | HR 62 | Temp 98.1°F | Ht 69.0 in | Wt 125.2 lb

## 2021-12-20 DIAGNOSIS — Z139 Encounter for screening, unspecified: Secondary | ICD-10-CM

## 2021-12-20 DIAGNOSIS — Z720 Tobacco use: Secondary | ICD-10-CM | POA: Insufficient documentation

## 2021-12-20 DIAGNOSIS — K2950 Unspecified chronic gastritis without bleeding: Secondary | ICD-10-CM | POA: Insufficient documentation

## 2021-12-20 DIAGNOSIS — Z1211 Encounter for screening for malignant neoplasm of colon: Secondary | ICD-10-CM

## 2021-12-20 DIAGNOSIS — I10 Essential (primary) hypertension: Secondary | ICD-10-CM | POA: Insufficient documentation

## 2021-12-20 MED ORDER — PANTOPRAZOLE SODIUM 40 MG PO TBEC
40.0000 mg | DELAYED_RELEASE_TABLET | Freq: Every day | ORAL | 3 refills | Status: DC
Start: 2021-12-20 — End: 2022-03-16
  Filled 2021-12-20: qty 30, 30d supply, fill #0

## 2021-12-20 MED ORDER — AMLODIPINE BESYLATE 5 MG PO TABS
5.0000 mg | ORAL_TABLET | Freq: Every day | ORAL | 1 refills | Status: DC
  Filled 2021-12-20: qty 90, 90d supply, fill #0

## 2021-12-20 MED ORDER — DICYCLOMINE HCL 20 MG PO TABS
20.0000 mg | ORAL_TABLET | Freq: Four times a day (QID) | ORAL | 2 refills | Status: DC | PRN
  Filled 2021-12-20: qty 40, 10d supply, fill #0

## 2021-12-20 MED ORDER — ONDANSETRON HCL 4 MG PO TABS
4.0000 mg | ORAL_TABLET | Freq: Two times a day (BID) | ORAL | 4 refills | Status: DC
  Filled 2021-12-20: qty 60, 30d supply, fill #0

## 2021-12-20 NOTE — Assessment & Plan Note (Signed)
Chronic gastritis with abdominal pain and also needs colonoscopy plan to refer to gastroenterology and resume Bentyl, Zofran, and pantoprazole

## 2021-12-20 NOTE — Progress Notes (Signed)
Pt request RF on Omeprazole 20mg .  Pt also requesting RF for Zofran & Pain.

## 2021-12-20 NOTE — Assessment & Plan Note (Signed)
Hypertension not well controlled we will plan to begin amlodipine 5 mg daily and gave patient lifestyle medicine handout  The following Lifestyle Medicine recommendations according to American College of Lifestyle Medicine [facility]) were discussed and offered to patient who agrees to start the journey:  A. Whole Foods, Plant-based plate comprising of fruits and vegetables, plant-based proteins, whole-grain carbohydrates was discussed in detail with the patient.   A list for source of those nutrients were also provided to the patient.  Patient will use only water or unsweetened tea for hydration. B.  The need to stay away from risky substances including alcohol, smoking; obtaining 7 to 9 hours of restorative sleep, at least 150 minutes of moderate intensity exercise weekly, the importance of healthy social connections,  and stress reduction techniques were discussed. C.  A full color page of  Calorie density of various food groups per pound showing examples of each food groups was provided to the patient.

## 2021-12-20 NOTE — Patient Instructions (Signed)
Start amlodipine 1 daily for blood pressure  Start pantoprazole daily for abdominal pain and you were also given dicyclomine to take 4 times daily as needed for abdominal pain and also nausea medicine was sent  All medications sent to pharmacy downstairs  Reduce tobacco intake and reduce your alcohol intake as discussed  Lifestyle medicine handout given try to eat more vegetables and fruits in your diet  Referral to gastroenterology was made for colonoscopy and also upper stomach examination  Complete health screening labs obtained at this visit  Return to see Dr. Delford Field 4 months

## 2021-12-20 NOTE — Assessment & Plan Note (Signed)
  .   Current smoking consumption amount: 10 cigarettes a day  . Dicsussion on advise to quit smoking and smoking impacts: Cardiovascular impact  . Patient's willingness to quit: Wants to quit  . Methods to quit smoking discussed: Behavioral modification nicotine replacement  . Medication management of smoking session drugs discussed: Nicotine patch  . Resources provided:  AVS   . Setting quit date not established  . Follow-up arranged 2 months   Time spent counseling the patient: 5 minutes

## 2021-12-21 LAB — COMPREHENSIVE METABOLIC PANEL
ALT: 18 IU/L (ref 0–44)
AST: 19 IU/L (ref 0–40)
Albumin/Globulin Ratio: 1.4 (ref 1.2–2.2)
Albumin: 4.7 g/dL (ref 3.8–4.9)
Alkaline Phosphatase: 96 IU/L (ref 44–121)
BUN/Creatinine Ratio: 7 — ABNORMAL LOW (ref 9–20)
BUN: 6 mg/dL (ref 6–24)
Bilirubin Total: 0.4 mg/dL (ref 0.0–1.2)
CO2: 25 mmol/L (ref 20–29)
Calcium: 9.7 mg/dL (ref 8.7–10.2)
Chloride: 103 mmol/L (ref 96–106)
Creatinine, Ser: 0.91 mg/dL (ref 0.76–1.27)
Globulin, Total: 3.4 g/dL (ref 1.5–4.5)
Glucose: 73 mg/dL (ref 70–99)
Potassium: 4.1 mmol/L (ref 3.5–5.2)
Sodium: 142 mmol/L (ref 134–144)
Total Protein: 8.1 g/dL (ref 6.0–8.5)
eGFR: 100 mL/min/{1.73_m2} (ref >59)

## 2021-12-21 LAB — THYROID PANEL WITH TSH
Free Thyroxine Index: 1.7 (ref 1.2–4.9)
T3 Uptake Ratio: 28 % (ref 24–39)
T4, Total: 5.9 ug/dL (ref 4.5–12.0)
TSH: 0.468 u[IU]/mL (ref 0.450–4.500)

## 2021-12-21 LAB — CBC WITH DIFFERENTIAL/PLATELET
Basophils Absolute: 0 10*3/uL (ref 0.0–0.2)
Basos: 0 %
EOS (ABSOLUTE): 0.2 10*3/uL (ref 0.0–0.4)
Eos: 2 %
Hematocrit: 45.3 % (ref 37.5–51.0)
Hemoglobin: 15.5 g/dL (ref 13.0–17.7)
Immature Grans (Abs): 0 10*3/uL (ref 0.0–0.1)
Immature Granulocytes: 0 %
Lymphocytes Absolute: 1.6 10*3/uL (ref 0.7–3.1)
Lymphs: 17 %
MCH: 32.3 pg (ref 26.6–33.0)
MCHC: 34.2 g/dL (ref 31.5–35.7)
MCV: 94 fL (ref 79–97)
Monocytes Absolute: 0.6 10*3/uL (ref 0.1–0.9)
Monocytes: 6 %
Neutrophils Absolute: 7.2 10*3/uL — ABNORMAL HIGH (ref 1.4–7.0)
Neutrophils: 75 %
Platelets: 249 10*3/uL (ref 150–450)
RBC: 4.8 x10E6/uL (ref 4.14–5.80)
RDW: 11.8 % (ref 11.6–15.4)
WBC: 9.5 10*3/uL (ref 3.4–10.8)

## 2021-12-21 LAB — HEMOGLOBIN A1C
Est. average glucose Bld gHb Est-mCnc: 103 mg/dL
Hgb A1c MFr Bld: 5.2 % (ref 4.8–5.6)

## 2021-12-21 LAB — LIPID PANEL
Chol/HDL Ratio: 2 ratio (ref 0.0–5.0)
Cholesterol, Total: 134 mg/dL (ref 100–199)
HDL: 66 mg/dL (ref >39)
LDL Chol Calc (NIH): 56 mg/dL (ref 0–99)
Triglycerides: 54 mg/dL (ref 0–149)
VLDL Cholesterol Cal: 12 mg/dL (ref 5–40)

## 2021-12-21 LAB — HCV INTERPRETATION

## 2021-12-21 LAB — HCV AB W REFLEX TO QUANT PCR: HCV Ab: NONREACTIVE

## 2021-12-21 NOTE — Progress Notes (Signed)
Let pt know kidney liver normal, blood count normal, cholesterol normal , thyroid normal, no Hep C

## 2021-12-22 ENCOUNTER — Telehealth: Payer: Self-pay

## 2021-12-22 NOTE — Telephone Encounter (Signed)
Pt was called and no vm was left due to mailbox being full. Information has been sent to nurse pool.  

## 2021-12-22 NOTE — Telephone Encounter (Signed)
-----   Message from [name], MD sent at 12/21/2021  6:17 AM EDT ----- Let pt know kidney liver normal, blood count normal, cholesterol normal , thyroid normal, no Hep C

## 2021-12-27 ENCOUNTER — Other Ambulatory Visit: Payer: Self-pay

## 2022-01-26 ENCOUNTER — Ambulatory Visit: Payer: Self-pay | Admitting: Physician Assistant

## 2022-01-26 ENCOUNTER — Encounter: Payer: Self-pay | Admitting: *Deleted

## 2022-03-14 ENCOUNTER — Emergency Department (HOSPITAL_COMMUNITY): Payer: Self-pay

## 2022-03-14 ENCOUNTER — Other Ambulatory Visit: Payer: Self-pay

## 2022-03-14 ENCOUNTER — Encounter (HOSPITAL_COMMUNITY): Payer: Self-pay

## 2022-03-14 ENCOUNTER — Inpatient Hospital Stay (HOSPITAL_COMMUNITY)
Admission: EM | Admit: 2022-03-14 | Discharge: 2022-03-16 | DRG: 690 | Disposition: A | Discharge status: Home / Self Care | Payer: Self-pay | Attending: Internal Medicine | Admitting: Internal Medicine

## 2022-03-14 DIAGNOSIS — N200 Calculus of kidney: Secondary | ICD-10-CM | POA: Diagnosis present

## 2022-03-14 DIAGNOSIS — I10 Essential (primary) hypertension: Secondary | ICD-10-CM | POA: Diagnosis present

## 2022-03-14 DIAGNOSIS — F17210 Nicotine dependence, cigarettes, uncomplicated: Secondary | ICD-10-CM | POA: Diagnosis present

## 2022-03-14 DIAGNOSIS — N179 Acute kidney failure, unspecified: Secondary | ICD-10-CM | POA: Diagnosis present

## 2022-03-14 DIAGNOSIS — R7989 Other specified abnormal findings of blood chemistry: Secondary | ICD-10-CM | POA: Diagnosis present

## 2022-03-14 DIAGNOSIS — E871 Hypo-osmolality and hyponatremia: Secondary | ICD-10-CM | POA: Diagnosis present

## 2022-03-14 DIAGNOSIS — Z79899 Other long term (current) drug therapy: Secondary | ICD-10-CM

## 2022-03-14 DIAGNOSIS — E876 Hypokalemia: Secondary | ICD-10-CM | POA: Diagnosis present

## 2022-03-14 DIAGNOSIS — N12 Tubulo-interstitial nephritis, not specified as acute or chronic: Secondary | ICD-10-CM | POA: Diagnosis present

## 2022-03-14 DIAGNOSIS — A419 Sepsis, unspecified organism: Principal | ICD-10-CM

## 2022-03-14 DIAGNOSIS — E860 Dehydration: Secondary | ICD-10-CM | POA: Diagnosis present

## 2022-03-14 DIAGNOSIS — K2900 Acute gastritis without bleeding: Secondary | ICD-10-CM | POA: Diagnosis present

## 2022-03-14 DIAGNOSIS — N10 Acute tubulo-interstitial nephritis: Principal | ICD-10-CM | POA: Diagnosis present

## 2022-03-14 DIAGNOSIS — E861 Hypovolemia: Secondary | ICD-10-CM | POA: Diagnosis present

## 2022-03-14 LAB — COMPREHENSIVE METABOLIC PANEL
ALT: 26 U/L (ref 0–44)
AST: 33 U/L (ref 15–41)
Albumin: 4.9 g/dL (ref 3.5–5.0)
Alkaline Phosphatase: 85 U/L (ref 38–126)
Anion gap: 18 — ABNORMAL HIGH (ref 5–15)
BUN: 41 mg/dL — ABNORMAL HIGH (ref 6–20)
CO2: 27 mmol/L (ref 22–32)
Calcium: 9.8 mg/dL (ref 8.9–10.3)
Chloride: 85 mmol/L — ABNORMAL LOW (ref 98–111)
Creatinine, Ser: 1.55 mg/dL — ABNORMAL HIGH (ref 0.61–1.24)
GFR, Estimated: 53 mL/min — ABNORMAL LOW (ref >60)
Glucose, Bld: 130 mg/dL — ABNORMAL HIGH (ref 70–99)
Potassium: 3.1 mmol/L — ABNORMAL LOW (ref 3.5–5.1)
Sodium: 130 mmol/L — ABNORMAL LOW (ref 135–145)
Total Bilirubin: 1.9 mg/dL — ABNORMAL HIGH (ref 0.3–1.2)
Total Protein: 9.7 g/dL — ABNORMAL HIGH (ref 6.5–8.1)

## 2022-03-14 LAB — URINALYSIS, ROUTINE W REFLEX MICROSCOPIC
Bilirubin Urine: NEGATIVE
Glucose, UA: NEGATIVE mg/dL
Hgb urine dipstick: NEGATIVE
Ketones, ur: 5 mg/dL — AB
Nitrite: NEGATIVE
Protein, ur: 100 mg/dL — AB
Specific Gravity, Urine: 1.023 (ref 1.005–1.030)
WBC, UA: >50 WBC/hpf — ABNORMAL HIGH (ref 0–5)
pH: 5 (ref 5.0–8.0)

## 2022-03-14 LAB — LIPASE, BLOOD: Lipase: 32 U/L (ref 11–51)

## 2022-03-14 LAB — CBC WITH DIFFERENTIAL/PLATELET
Abs Immature Granulocytes: 0.05 10*3/uL (ref 0.00–0.07)
Basophils Absolute: 0 10*3/uL (ref 0.0–0.1)
Basophils Relative: 0 %
Eosinophils Absolute: 0 10*3/uL (ref 0.0–0.5)
Eosinophils Relative: 0 %
HCT: 55.3 % — ABNORMAL HIGH (ref 39.0–52.0)
Hemoglobin: 18.8 g/dL — ABNORMAL HIGH (ref 13.0–17.0)
Immature Granulocytes: 0 %
Lymphocytes Relative: 12 %
Lymphs Abs: 1.8 10*3/uL (ref 0.7–4.0)
MCH: 32.3 pg (ref 26.0–34.0)
MCHC: 34 g/dL (ref 30.0–36.0)
MCV: 95 fL (ref 80.0–100.0)
Monocytes Absolute: 0.8 10*3/uL (ref 0.1–1.0)
Monocytes Relative: 6 %
Neutro Abs: 11.6 10*3/uL — ABNORMAL HIGH (ref 1.7–7.7)
Neutrophils Relative %: 82 %
Platelets: 259 10*3/uL (ref 150–400)
RBC: 5.82 MIL/uL — ABNORMAL HIGH (ref 4.22–5.81)
RDW: 12.2 % (ref 11.5–15.5)
WBC: 14.3 10*3/uL — ABNORMAL HIGH (ref 4.0–10.5)
nRBC: 0 % (ref 0.0–0.2)

## 2022-03-14 LAB — LACTIC ACID, PLASMA
Lactic Acid, Venous: 2.7 mmol/L (ref 0.5–1.9)
Lactic Acid, Venous: 2.4 mmol/L (ref 0.5–1.9)

## 2022-03-14 MED ORDER — FAMOTIDINE IN NACL 20-0.9 MG/50ML-% IV SOLN
20.0000 mg | Freq: Once | INTRAVENOUS | Status: AC
  Administered 2022-03-14: 20 mg via INTRAVENOUS
  Filled 2022-03-14: qty 50

## 2022-03-14 MED ORDER — AMLODIPINE BESYLATE 5 MG PO TABS
5.0000 mg | ORAL_TABLET | Freq: Every day | ORAL | Status: DC
  Administered 2022-03-14 – 2022-03-16 (×3): 5 mg via ORAL
  Filled 2022-03-14 (×3): qty 1

## 2022-03-14 MED ORDER — POTASSIUM CHLORIDE CRYS ER 20 MEQ PO TBCR
20.0000 meq | EXTENDED_RELEASE_TABLET | Freq: Once | ORAL | Status: AC
  Administered 2022-03-14: 20 meq via ORAL
  Filled 2022-03-14: qty 1

## 2022-03-14 MED ORDER — LACTATED RINGERS IV BOLUS
1000.0000 mL | Freq: Once | INTRAVENOUS | Status: AC
  Administered 2022-03-14: 1000 mL via INTRAVENOUS

## 2022-03-14 MED ORDER — IOHEXOL 350 MG/ML SOLN
75.0000 mL | Freq: Once | INTRAVENOUS | Status: AC | PRN
  Administered 2022-03-14: 75 mL via INTRAVENOUS

## 2022-03-14 MED ORDER — SODIUM CHLORIDE 0.9 % IV SOLN
2.0000 g | INTRAVENOUS | Status: DC
  Administered 2022-03-15 – 2022-03-16 (×2): 2 g via INTRAVENOUS
  Filled 2022-03-14 (×2): qty 20

## 2022-03-14 MED ORDER — SODIUM CHLORIDE 0.9 % IV SOLN: INTRAVENOUS | Status: AC

## 2022-03-14 MED ORDER — MORPHINE SULFATE (PF) 4 MG/ML IV SOLN
4.0000 mg | Freq: Once | INTRAVENOUS | Status: AC
  Administered 2022-03-14: 4 mg via INTRAVENOUS
  Filled 2022-03-14: qty 1

## 2022-03-14 MED ORDER — ONDANSETRON 4 MG PO TBDP
4.0000 mg | ORAL_TABLET | Freq: Once | ORAL | Status: AC
Start: 2022-03-14 — End: 2022-03-14
  Administered 2022-03-14: 4 mg via ORAL
  Filled 2022-03-14: qty 1

## 2022-03-14 MED ORDER — ACETAMINOPHEN 650 MG RE SUPP: 650.0000 mg | Freq: Four times a day (QID) | RECTAL | Status: DC | PRN

## 2022-03-14 MED ORDER — DICYCLOMINE HCL 20 MG PO TABS: 20.0000 mg | ORAL_TABLET | Freq: Four times a day (QID) | ORAL | Status: DC | PRN

## 2022-03-14 MED ORDER — ENOXAPARIN SODIUM 40 MG/0.4ML IJ SOSY
40.0000 mg | PREFILLED_SYRINGE | INTRAMUSCULAR | Status: DC
  Administered 2022-03-14 – 2022-03-15 (×2): 40 mg via SUBCUTANEOUS
  Filled 2022-03-14 (×2): qty 0.4

## 2022-03-14 MED ORDER — POTASSIUM CHLORIDE 10 MEQ/100ML IV SOLN
10.0000 meq | INTRAVENOUS | Status: AC
  Administered 2022-03-14 (×2): 10 meq via INTRAVENOUS
  Filled 2022-03-14 (×2): qty 100

## 2022-03-14 MED ORDER — ACETAMINOPHEN 325 MG PO TABS: 650.0000 mg | ORAL_TABLET | Freq: Four times a day (QID) | ORAL | Status: DC | PRN

## 2022-03-14 MED ORDER — ACETAMINOPHEN 500 MG PO TABS
1000.0000 mg | ORAL_TABLET | Freq: Once | ORAL | Status: DC
  Filled 2022-03-14: qty 2

## 2022-03-14 MED ORDER — RISAQUAD PO CAPS
1.0000 | ORAL_CAPSULE | Freq: Three times a day (TID) | ORAL | Status: DC
  Administered 2022-03-14 – 2022-03-16 (×6): 1 via ORAL
  Filled 2022-03-14 (×8): qty 1

## 2022-03-14 MED ORDER — LACTATED RINGERS IV BOLUS
500.0000 mL | Freq: Once | INTRAVENOUS | Status: AC
  Administered 2022-03-14: 500 mL via INTRAVENOUS

## 2022-03-14 MED ORDER — PANTOPRAZOLE SODIUM 40 MG PO TBEC
40.0000 mg | DELAYED_RELEASE_TABLET | Freq: Two times a day (BID) | ORAL | Status: DC
  Administered 2022-03-14 – 2022-03-16 (×4): 40 mg via ORAL
  Filled 2022-03-14 (×4): qty 1

## 2022-03-14 MED ORDER — ONDANSETRON HCL 4 MG/2ML IJ SOLN
4.0000 mg | Freq: Once | INTRAMUSCULAR | Status: AC
  Administered 2022-03-14: 4 mg via INTRAVENOUS
  Filled 2022-03-14: qty 2

## 2022-03-14 MED ORDER — SODIUM CHLORIDE 0.9 % IV SOLN
1.0000 g | Freq: Once | INTRAVENOUS | Status: AC
  Administered 2022-03-14: 1 g via INTRAVENOUS
  Filled 2022-03-14: qty 10

## 2022-03-14 MED ORDER — ONDANSETRON HCL 4 MG PO TABS
4.0000 mg | ORAL_TABLET | Freq: Two times a day (BID) | ORAL | Status: DC
  Administered 2022-03-14 – 2022-03-16 (×4): 4 mg via ORAL
  Filled 2022-03-14 (×4): qty 1

## 2022-03-14 MED ORDER — MORPHINE SULFATE (PF) 4 MG/ML IV SOLN
4.0000 mg | INTRAVENOUS | Status: DC | PRN
  Administered 2022-03-14 – 2022-03-15 (×5): 4 mg via INTRAVENOUS
  Filled 2022-03-14 (×5): qty 1

## 2022-03-14 NOTE — ED Provider Triage Note (Signed)
Emergency Medicine Provider Triage Evaluation Note  Daniel [name] , a 56 y.o. male  was evaluated in triage.  Pt complains of mid abdominal pain since Friday of last week.  States not tolerating PO-- either vomiting or having diarrhea if trying to eat/drink.  Pain initially constant but now intermittent.  States this happens every few months but no one has told him why it keeps happening.  Review of Systems  Positive: Abdominal pain, N/V/D Negative: fever  Physical Exam  BP (!) 133/99 (BP Location: Left Arm)   Pulse 94   Temp 97.9 F (36.6 C)   Resp (!) 22   Ht 5\' 8"  (1.727 m)   Wt 59 kg   SpO2 100%   BMI 19.77 kg/m   Gen:   Awake, no distress   Resp:  Normal effort  MSK:   Moves extremities without difficulty  Other:  Patient had laid himself into the floor, moaning, able to get himself up out of the floor independently, no trauma noted  Medical Decision Making  Medically screening exam initiated at 5:30 AM.  Appropriate orders placed.  Daniel [name] was informed that the remainder of the evaluation will be completed by another provider, this initial triage assessment does not replace that evaluation, and the importance of remaining in the ED until their evaluation is complete.  Abdominal pain, N/V/D.  Will check labs.   , PA-C 03/14/22 [phone]

## 2022-03-14 NOTE — ED Notes (Signed)
Attempted to get second set of blood cultures by both this RN and phlebotomy with no success.

## 2022-03-14 NOTE — ED Triage Notes (Signed)
Pt arrived comlaining of midline abdominal pain that started on Friday and has been coming and going since.  Pt states that he hasn't been able to keep any food down, has had n/v/d   Pt arrived to triage and go onto the floor

## 2022-03-14 NOTE — ED Notes (Signed)
ED TO INPATIENT HANDOFF REPORT  ED Nurse Name and Phone #: Duwayne Heck [phone]   S Name/Age/Gender Daniel [name] 56 y.o. male Room/Bed: 030C/030C  Code Status   Code Status: Full Code  Home/SNF/Other Home Patient oriented to: self, place, time, and situation Is this baseline? Yes   Triage Complete: Triage complete  Chief Complaint Pyelonephritis [N12]  Triage Note Pt arrived comlaining of midline abdominal pain that started on Friday and has been coming and going since.  Pt states that he hasn't been able to keep any food down, has had n/v/d   Pt arrived to triage and go onto the floor     Allergies No Known Allergies  Level of Care/Admitting Diagnosis ED Disposition     ED Disposition  Admit   Condition  --   Comment  Hospital Area: [facility] [[id number]]  Level of Care: Med-Surg [16]  May place patient in observation at [facility] or [facility] if equivalent level of care is available:: No  Covid Evaluation: Asymptomatic - no recent exposure (last 10 days) testing not required  Diagnosis: Pyelonephritis [324401]  Admitting Physician: Emeline General [[id number]]  Attending Physician: Emeline General [[id number]]          B Medical/Surgery History Past Medical History:  Diagnosis Date   Anxiety    Chronic lower back pain    Depression    Internal hemorrhoids with other complication    Nephrolithiasis    Rectal abscess    Past Surgical History:  Procedure Laterality Date   HERNIA REPAIR     INCISION AND DRAINAGE PERIRECTAL ABSCESS  10/2010     A IV Location/Drains/Wounds Patient Lines/Drains/Airways Status     Active Line/Drains/Airways     Name Placement date Placement time Site Days   Peripheral IV 03/14/22 22 G Anterior;Proximal;Right Forearm 03/14/22  0850  Forearm  less than 1            Intake/Output Last 24 hours  Intake/Output Summary (Last 24 hours) at 03/14/2022 1355 Last data filed at 03/14/2022 1209 Gross per 24  hour  Intake 1851.18 ml  Output --  Net 1851.18 ml    Labs/Imaging Results for orders placed or performed during the hospital encounter of 03/14/22 (from the past 48 hour(s))  Urinalysis, Routine w reflex microscopic     Status: Abnormal   Collection Time: 03/14/22  5:34 AM  Result Value Ref Range   Color, Urine AMBER (A) YELLOW    Comment: BIOCHEMICALS MAY BE AFFECTED BY COLOR   APPearance CLOUDY (A) CLEAR   Specific Gravity, Urine 1.023 1.005 - 1.030   pH 5.0 5.0 - 8.0   Glucose, UA NEGATIVE NEGATIVE mg/dL   Hgb urine dipstick NEGATIVE NEGATIVE   Bilirubin Urine NEGATIVE NEGATIVE   Ketones, ur 5 (A) NEGATIVE mg/dL   Protein, ur 425 (A) NEGATIVE mg/dL   Nitrite NEGATIVE NEGATIVE   Leukocytes,Ua LARGE (A) NEGATIVE   RBC / HPF 6-10 0 - 5 RBC/hpf   WBC, UA >50 (H) 0 - 5 WBC/hpf   Bacteria, UA FEW (A) NONE SEEN   Squamous Epithelial / LPF 0-5 0 - 5   Mucus PRESENT    Hyaline Casts, UA PRESENT     Comment: Performed at [facility], [address], [place], [place] [address]  CBC with Differential     Status: Abnormal   Collection Time: 03/14/22  5:51 AM  Result Value Ref Range   WBC 14.3 (H) 4.0 -  10.5 K/uL   RBC 5.82 (H) 4.22 - 5.81 MIL/uL   Hemoglobin 18.8 (H) 13.0 - 17.0 g/dL   HCT 86.555.3 (H) 78.439.0 - 69.652.0 %   MCV 95.0 80.0 - 100.0 fL   MCH 32.3 26.0 - 34.0 pg   MCHC 34.0 30.0 - 36.0 g/dL   RDW 29.512.2 28.411.5 - 13.215.5 %   Platelets 259 150 - 400 K/uL   nRBC 0.0 0.0 - 0.2 %   Neutrophils Relative % 82 %   Neutro Abs 11.6 (H) 1.7 - 7.7 K/uL   Lymphocytes Relative 12 %   Lymphs Abs 1.8 0.7 - 4.0 K/uL   Monocytes Relative 6 %   Monocytes Absolute 0.8 0.1 - 1.0 K/uL   Eosinophils Relative 0 %   Eosinophils Absolute 0.0 0.0 - 0.5 K/uL   Basophils Relative 0 %   Basophils Absolute 0.0 0.0 - 0.1 K/uL   Immature Granulocytes 0 %   Abs Immature Granulocytes 0.05 0.00 - 0.07 K/uL    Comment: Performed at [facility], [address] [place], [place] [address]   Comprehensive metabolic panel     Status: Abnormal   Collection Time: 03/14/22  5:51 AM  Result Value Ref Range   Sodium 130 (L) 135 - 145 mmol/L   Potassium 3.1 (L) 3.5 - 5.1 mmol/L   Chloride 85 (L) 98 - 111 mmol/L   CO2 27 22 - 32 mmol/L   Glucose, Bld 130 (H) 70 - 99 mg/dL    Comment: Glucose reference range applies only to samples taken after fasting for at least 8 hours.   BUN 41 (H) 6 - 20 mg/dL   Creatinine, Ser 2.721.55 (H) 0.61 - 1.24 mg/dL   Calcium 9.8 8.9 - 53.610.3 mg/dL   Total Protein 9.7 (H) 6.5 - 8.1 g/dL   Albumin 4.9 3.5 - 5.0 g/dL   AST 33 15 - 41 U/L   ALT 26 0 - 44 U/L   Alkaline Phosphatase 85 38 - 126 U/L   Total Bilirubin 1.9 (H) 0.3 - 1.2 mg/dL   GFR, Estimated 53 (L) >60 mL/min    Comment: (NOTE) Calculated using the CKD-EPI Creatinine Equation (2021)    Anion gap 18 (H) 5 - 15    Comment: Performed at [facility], [address] [place], [place] [address]  Lipase, blood     Status: None   Collection Time: 03/14/22  5:51 AM  Result Value Ref Range   Lipase 32 11 - 51 U/L    Comment: Performed at [facility], [address] [place], [place] [address]  Lactic acid, plasma     Status: Abnormal   Collection Time: 03/14/22  8:44 AM  Result Value Ref Range   Lactic Acid, Venous 2.7 (HH) 0.5 - 1.9 mmol/L    Comment: CRITICAL RESULT CALLED TO, READ BACK BY AND VERIFIED WITH Stevey Stapleton RN.[email or web address]  ON 11.29.23 BY TCALDWELL MT. Performed at [facility], [address] [place], [place] [address]   Lactic acid, plasma     Status: Abnormal   Collection Time: 03/14/22 11:30 AM  Result Value Ref Range   Lactic Acid, Venous 2.4 (HH) 0.5 - 1.9 mmol/L    Comment: CRITICAL VALUE NOTED. VALUE IS CONSISTENT WITH PREVIOUSLY REPORTED/CALLED VALUE Performed at [facility], [address] [place], [place] [address]    DG Chest Portable 1 View  Result Date: 03/14/2022 CLINICAL DATA:  Cough Sepsis Mid abdominal pain EXAM:  PORTABLE CHEST -  1 VIEW COMPARISON:  10/08/2015 FINDINGS: Cardiomediastinal silhouette and pulmonary vasculature are within normal limits. Lungs are hyperexpanded, but otherwise clear. IMPRESSION: No acute cardiopulmonary process. Electronically Signed   By: Acquanetta Belling M.D.   On: 03/14/2022 11:04   CT ABDOMEN PELVIS W CONTRAST  Result Date: 03/14/2022 CLINICAL DATA:  Abdominal pain. EXAM: CT ABDOMEN AND PELVIS WITH CONTRAST TECHNIQUE: Multidetector CT imaging of the abdomen and pelvis was performed using the standard protocol following bolus administration of intravenous contrast. RADIATION DOSE REDUCTION: This exam was performed according to the departmental dose-optimization program which includes automated exposure control, adjustment of the mA and/or kV according to patient size and/or use of iterative reconstruction technique. CONTRAST:  43mL OMNIPAQUE IOHEXOL 350 MG/ML SOLN COMPARISON:  CT abdomen pelvis dated July 19, 2021. FINDINGS: Lower chest: Multiple small clustered peribronchovascular cystic changes in both lung bases again noted. Increased density involving one of these areas in the right lower lobe (series 5, image 5). Hepatobiliary: Multiple tiny subcentimeter low-density lesions in the liver are unchanged. No new focal liver abnormality. The gallbladder is unremarkable. No biliary dilatation. Pancreas: Unremarkable. No pancreatic ductal dilatation or surrounding inflammatory changes. Spleen: Normal in size without focal abnormality. Adrenals/Urinary Tract: The adrenal glands are unremarkable. Unchanged right renal calculi measuring up to 1.5 cm. No left renal calculi. No hydronephrosis. Bladder is unremarkable. Stomach/Bowel: Stomach is within normal limits. Appendix appears normal. No evidence of bowel wall thickening, distention, or inflammatory changes. Vascular/Lymphatic: Aortic atherosclerosis. No enlarged abdominal or pelvic lymph nodes. Reproductive: Prostate is unremarkable. Other: No abdominal wall  hernia or abnormality. No abdominopelvic ascites. No pneumoperitoneum. Musculoskeletal: No acute or significant osseous findings. IMPRESSION: 1. No acute intra-abdominal process. 2. Unchanged nonobstructive right nephrolithiasis. 3. Multiple small clustered peribronchovascular cystic changes in both lung bases again noted, likely postinfectious or postinflammatory. Increased density involving one of these areas in the right lower lobe may reflect superimposed acute inflammation or infection. Follow-up noncontrast chest CT in 3 months is recommended. 4.  Aortic Atherosclerosis (ICD10-I70.0). Electronically Signed   By: Obie Dredge M.D.   On: 03/14/2022 10:25    Pending Labs Unresulted Labs (From admission, onward)     Start     Ordered   03/15/22 0500  CBC  Tomorrow morning,   R        03/14/22 1306   03/15/22 0500  Basic metabolic panel  Tomorrow morning,   R        03/14/22 1306   03/14/22 1306  HIV Antibody (routine testing w rflx)  (HIV Antibody (Routine testing w reflex) panel)  Once,   R        03/14/22 1306   03/14/22 1023  Blood culture (routine x 2)  BLOOD CULTURE X 2,   R      03/14/22 1023            Vitals/Pain Today's Vitals   03/14/22 1050 03/14/22 1115 03/14/22 1235 03/14/22 1345  BP:  (!) 185/140  123/86  Pulse:  75  78  Resp:  (!) 26  10  Temp:      TempSrc:      SpO2:  99%  100%  Weight:      Height:      PainSc: 10-Worst pain ever  4      Isolation Precautions No active isolations  Medications Medications  acetaminophen (TYLENOL) tablet 1,000 mg (1,000 mg Oral Patient Refused/Not Given 03/14/22 0858)  morphine (PF) 4 MG/ML injection 4  mg (4 mg Intravenous Given 03/14/22 1125)  pantoprazole (PROTONIX) EC tablet 40 mg (has no administration in time range)  0.9 %  sodium chloride infusion (has no administration in time range)  cefTRIAXone (ROCEPHIN) 2 g in sodium chloride 0.9 % 100 mL IVPB (has no administration in time range)  amLODipine (NORVASC)  tablet 5 mg (has no administration in time range)  dicyclomine (BENTYL) tablet 20 mg (has no administration in time range)  ondansetron (ZOFRAN) tablet 4 mg (has no administration in time range)  enoxaparin (LOVENOX) injection 40 mg (has no administration in time range)  acetaminophen (TYLENOL) tablet 650 mg (has no administration in time range)    Or  acetaminophen (TYLENOL) suppository 650 mg (has no administration in time range)  acidophilus (RISAQUAD) capsule 1 capsule (has no administration in time range)  potassium chloride SA (KLOR-CON M) CR tablet 20 mEq (has no administration in time range)  ondansetron (ZOFRAN-ODT) disintegrating tablet 4 mg (4 mg Oral Given 03/14/22 0553)  morphine (PF) 4 MG/ML injection 4 mg (4 mg Intravenous Given 03/14/22 0853)  ondansetron (ZOFRAN) injection 4 mg (4 mg Intravenous Given 03/14/22 0852)  famotidine (PEPCID) IVPB 20 mg premix (0 mg Intravenous Stopped 03/14/22 0928)  lactated ringers bolus 1,000 mL (0 mLs Intravenous Stopped 03/14/22 1041)  potassium chloride 10 mEq in 100 mL IVPB (0 mEq Intravenous Stopped 03/14/22 1130)  iohexol (OMNIPAQUE) 350 MG/ML injection 75 mL (75 mLs Intravenous Contrast Given 03/14/22 1002)  lactated ringers bolus 500 mL (0 mLs Intravenous Stopped 03/14/22 1130)  cefTRIAXone (ROCEPHIN) 1 g in sodium chloride 0.9 % 100 mL IVPB (0 g Intravenous Stopped 03/14/22 1209)    Mobility walks Low fall risk   Focused Assessments    R Recommendations: See Admitting Provider Note  Report given to:   Additional Notes:  urinal

## 2022-03-14 NOTE — ED Provider Notes (Signed)
[facility] EMERGENCY DEPARTMENT Provider Note   CSN: [id number] Arrival date & time: 03/14/22  0530     History Chief Complaint  Patient presents with  . Abdominal Pain    HPI Daniel [name] is a 56 y.o. male presenting for chief complaint of abdominal pain.  He states that since Friday he has had severe sharp epigastric abdominal pain.  He has not been able to tolerate any p.o. intake since Friday because of the severity of the symptoms.  He endorses a history of similar but was lost to follow-up with gastroenterology.  He does not know what the underlying cause is. Patient's recorded medical, surgical, social, medication list and allergies were reviewed in the Snapshot window as part of the initial history.   Review of Systems   Review of Systems  Constitutional:  Negative for chills and fever.  HENT:  Negative for ear pain and sore throat.   Eyes:  Negative for pain and visual disturbance.  Respiratory:  Negative for cough and shortness of breath.   Cardiovascular:  Negative for chest pain and palpitations.  Gastrointestinal:  Positive for abdominal pain and nausea. Negative for vomiting.  Genitourinary:  Negative for dysuria and hematuria.  Musculoskeletal:  Negative for arthralgias and back pain.  Skin:  Negative for color change and rash.  Neurological:  Negative for seizures and syncope.  All other systems reviewed and are negative.   Physical Exam Updated Vital Signs BP (!) 133/99 (BP Location: Left Arm)   Pulse 94   Temp 97.9 F (36.6 C)   Resp (!) 22   Ht 5\' 8"  (1.727 m)   Wt 59 kg   SpO2 100%   BMI 19.77 kg/m  Physical Exam Vitals and nursing note reviewed.  Constitutional:      General: He is not in acute distress.    Appearance: He is well-developed.  HENT:     Head: Normocephalic and atraumatic.     Nose: No congestion or rhinorrhea.     Mouth/Throat:     Mouth: Mucous membranes are moist.     Pharynx: Oropharynx is clear. No  oropharyngeal exudate.  Eyes:     Conjunctiva/sclera: Conjunctivae normal.     Pupils: Pupils are equal, round, and reactive to light.  Cardiovascular:     Rate and Rhythm: Normal rate and regular rhythm.     Heart sounds: No murmur heard. Pulmonary:     Effort: Pulmonary effort is normal. No respiratory distress.     Breath sounds: Normal breath sounds.  Abdominal:     Palpations: Abdomen is soft.     Tenderness: There is abdominal tenderness.  Musculoskeletal:        General: No swelling, tenderness, deformity or signs of injury. Normal range of motion.     Cervical back: Neck supple. No rigidity or tenderness.  Skin:    General: Skin is warm and dry.     Capillary Refill: Capillary refill takes less than 2 seconds.  Neurological:     General: No focal deficit present.     Mental Status: He is alert and oriented to person, place, and time. Mental status is at baseline.     Cranial Nerves: No cranial nerve deficit.     Motor: No weakness.  Psychiatric:        Mood and Affect: Mood normal.     ED Course/ Medical Decision Making/ A&P Clinical Course as of 03/14/22 1426  Wed Mar 14, 2022  1034 Likely  UTI/Pyelo  [CC]    Clinical Course User Index [CC] Glyn Ade, MD    Procedures Procedures   Medications Ordered in ED Medications  ondansetron (ZOFRAN-ODT) disintegrating tablet 4 mg (4 mg Oral Given 03/14/22 0553)   Medical Decision Making:   Daniel [name] is a 56 y.o. male who presented to the ED today with abdominal pain, detailed above.    Patient's presentation is complicated by their history of multiple comorbid medical problems.  Patient placed on continuous vitals and telemetry monitoring while in ED which was reviewed periodically.  Complete initial physical exam performed, notably the patient  was HDS in NAD.     Reviewed and confirmed nursing documentation for past medical history, family history, social history.    Initial Assessment:   With the  patient's presentation of abdominal pain, most likely diagnosis is nonspecific etiology. Other diagnoses were considered including (but not limited to) gastroenteritis, colitis, small bowel obstruction, appendicitis, cholecystitis, pancreatitis, nephrolithiasis, UTI, pyleonephritis, testicular torsion. These are considered less likely due to history of present illness and physical exam findings.   This is most consistent with an acute life/limb threatening illness complicated by underlying chronic conditions.   Initial Plan:  CBC/CMP to evaluate for underlying infectious/metabolic etiology for patient's abdominal pain  Lipase to evaluate for pancreatitis  EKG to evaluate for cardiac source of pain  ***CTAB/Pelvis with contrast to evaluate for structural/surgical etiology of patients' severe abdominal pain.  ***CT Ab/pelvis without contrast due to favored nephrolithiasis over GI etiology for patient's abdominal pain  ***Testicular ***Pelvic US to evaluate for torsion  Urinalysis and repeat physical assessment to evaluate for UTI/Pyelonpehritis  Empiric management of symptoms with escalating pain control and antiemetics as needed.   Initial Study Results:   Laboratory  All laboratory results reviewed without evidence of clinically relevant pathology.   ***Exceptions include: ***    ***EKG ***EKG was reviewed independently. Rate, rhythm, axis, intervals all examined and without medically relevant abnormality. ST segments without concerns for elevations.    Radiology All images reviewed independently. ***Agree with radiology report at this time.   DG Chest Portable 1 View  Result Date: 03/14/2022 CLINICAL DATA:  Cough Sepsis Mid abdominal pain EXAM: PORTABLE CHEST - 1 VIEW COMPARISON:  10/08/2015 FINDINGS: Cardiomediastinal silhouette and pulmonary vasculature are within normal limits. Lungs are hyperexpanded, but otherwise clear. IMPRESSION: No acute cardiopulmonary process. Electronically  Signed   By: Acquanetta Belling M.D.   On: 03/14/2022 11:04   CT ABDOMEN PELVIS W CONTRAST  Result Date: 03/14/2022 CLINICAL DATA:  Abdominal pain. EXAM: CT ABDOMEN AND PELVIS WITH CONTRAST TECHNIQUE: Multidetector CT imaging of the abdomen and pelvis was performed using the standard protocol following bolus administration of intravenous contrast. RADIATION DOSE REDUCTION: This exam was performed according to the departmental dose-optimization program which includes automated exposure control, adjustment of the mA and/or kV according to patient size and/or use of iterative reconstruction technique. CONTRAST:  8mL OMNIPAQUE IOHEXOL 350 MG/ML SOLN COMPARISON:  CT abdomen pelvis dated July 19, 2021. FINDINGS: Lower chest: Multiple small clustered peribronchovascular cystic changes in both lung bases again noted. Increased density involving one of these areas in the right lower lobe (series 5, image 5). Hepatobiliary: Multiple tiny subcentimeter low-density lesions in the liver are unchanged. No new focal liver abnormality. The gallbladder is unremarkable. No biliary dilatation. Pancreas: Unremarkable. No pancreatic ductal dilatation or surrounding inflammatory changes. Spleen: Normal in size without focal abnormality. Adrenals/Urinary Tract: The adrenal glands are unremarkable. Unchanged right renal calculi measuring  up to 1.5 cm. No left renal calculi. No hydronephrosis. Bladder is unremarkable. Stomach/Bowel: Stomach is within normal limits. Appendix appears normal. No evidence of bowel wall thickening, distention, or inflammatory changes. Vascular/Lymphatic: Aortic atherosclerosis. No enlarged abdominal or pelvic lymph nodes. Reproductive: Prostate is unremarkable. Other: No abdominal wall hernia or abnormality. No abdominopelvic ascites. No pneumoperitoneum. Musculoskeletal: No acute or significant osseous findings. IMPRESSION: 1. No acute intra-abdominal process. 2. Unchanged nonobstructive right nephrolithiasis.  3. Multiple small clustered peribronchovascular cystic changes in both lung bases again noted, likely postinfectious or postinflammatory. Increased density involving one of these areas in the right lower lobe may reflect superimposed acute inflammation or infection. Follow-up noncontrast chest CT in 3 months is recommended. 4.  Aortic Atherosclerosis (ICD10-I70.0). Electronically Signed   By: Obie Dredge M.D.   On: 03/14/2022 10:25     Consults: Case discussed with ***.   Final Reassessment and Plan:   ***      Clinical Impression: No diagnosis found.   Data Unavailable   Final Clinical Impression(s) / ED Diagnoses Final diagnoses:  None    Rx / DC Orders ED Discharge Orders     None

## 2022-03-14 NOTE — H&P (Signed)
History and Physical    Daniel [name] YCX:[id number] DOB: 1966-01-16 DOA: 03/14/2022  PCP: [name], MD (Confirm with patient/family/NH records and if not entered, this has to be entered at [facility] point of entry) Patient coming from: Home  I have personally briefly reviewed patient's old medical records in [facility]  Chief Complaint: Belly hurts, N/V  HPI: Daniel [name] is a 56 y.o. male with medical history significant of HTN, peptic ulcer, presented with intractable nausea with vomiting abdominal pain and flank pain.  Patient developed urinary frequency sometime early last week but no fever or chills.  Last Friday, patient started to have epigastric pain cramping-like associated with bilateral flank pain, he noticed urine smelled strong and cloudy.  Saturday he had several loose bowel movements diarrhea but no significant improvement of his abdominal pain.  He tried to use some OTC medications along with smoking marijuana and symptoms of abdominal pain partially improved.  However he developed strong feeling nauseous and vomiting with stomach content for the last 2 to 3 days and unable to take any food or water.  He denies any history of BPH, denies any nocturnal urination or weak stream.  At baseline, he has history of gastritis and H. pylori status posttreatment, despite, he continues to experience frequent epigastric pains, scheduled for EGD next month.  ED Course: Afebrile, tachycardia hypertensive.  CT abdomen pelvis showed unchanged nonobstructive right kidney stone, multiple small clustered cystic changes in both lungs, likely postinfection versus inflammatory.  WBC 14, hemoglobin 18.8, sodium 130, potassium 3.1, creatinine 1.5 compared to baseline 0.9.  UA showed > WBC, positive nitrite.  Patient was given IV bolus and started on ceftriaxone.  Review of Systems: As per HPI otherwise 14 point review of systems negative.    Past Medical History:  Diagnosis Date   Anxiety     Chronic lower back pain    Depression    Internal hemorrhoids with other complication    Nephrolithiasis    Rectal abscess     Past Surgical History:  Procedure Laterality Date   HERNIA REPAIR     INCISION AND DRAINAGE PERIRECTAL ABSCESS  10/2010     reports that he has been smoking cigarettes. He has a 21.00 pack-year smoking history. He has quit using smokeless tobacco. He reports current alcohol use. He reports current drug use. Drug: Marijuana.  No Known Allergies  Family History  Problem Relation Age of Onset   HIV/AIDS Father    Stomach cancer Sister    Stomach cancer Brother      Prior to Admission medications   Medication Sig Start Date End Date Taking? Authorizing Provider  amLODipine (NORVASC) 5 MG tablet Take 1 tablet (5 mg total) by mouth daily. 12/20/21   [name], MD  dicyclomine (BENTYL) 20 MG tablet Take 1 tablet (20 mg total) by mouth 4 (four) times daily as needed for spasms. 12/20/21   [name], MD  ondansetron (ZOFRAN) 4 MG tablet Take 1 tablet (4 mg total) by mouth 2 (two) times daily. 12/20/21   [name], MD  pantoprazole (PROTONIX) 40 MG tablet Take 1 tablet (40 mg total) by mouth daily. 12/20/21   [name], MD    Physical Exam: Vitals:   03/14/22 1007 03/14/22 1009 03/14/22 1030 03/14/22 1115  BP: (!) 157/117  (!) 181/101 (!) 185/140  Pulse: 76  72 75  Resp: (!) 23  15 (!) 26  Temp:  (!) 97.4 F (36.3  C)    TempSrc:  Oral    SpO2: 100%  100% 99%  Weight:      Height:        Constitutional: NAD, calm, comfortable Vitals:   03/14/22 1007 03/14/22 1009 03/14/22 1030 03/14/22 1115  BP: (!) 157/117  (!) 181/101 (!) 185/140  Pulse: 76  72 75  Resp: (!) 23  15 (!) 26  Temp:  (!) 97.4 F (36.3 C)    TempSrc:  Oral    SpO2: 100%  100% 99%  Weight:      Height:       Eyes: PERRL, lids and conjunctivae normal ENMT: Mucous membranes are dry. Posterior pharynx clear of any exudate or lesions.Normal dentition.   Neck: normal, supple, no masses, no thyromegaly Respiratory: clear to auscultation bilaterally, no wheezing, no crackles. Normal respiratory effort. No accessory muscle use.  Cardiovascular: Regular rate and rhythm, no murmurs / rubs / gallops. No extremity edema. 2+ pedal pulses. No carotid bruits.  Abdomen: mild tenderness epigastric area, no rebound or guarding, no masses palpated. No hepatosplenomegaly. Bowel sounds positive.  Musculoskeletal: no clubbing / cyanosis. No joint deformity upper and lower extremities. Good ROM, no contractures. Normal muscle tone.  Skin: no rashes, lesions, ulcers. No induration Neurologic: CN 2-12 grossly intact. Sensation intact, DTR normal. Strength 5/5 in all 4.  Psychiatric: Normal judgment and insight. Alert and oriented x 3. Normal mood.     Labs on Admission: I have personally reviewed following labs and imaging studies  CBC: Recent Labs  Lab 03/14/22 0551  WBC 14.3*  NEUTROABS 11.6*  HGB 18.8*  HCT 55.3*  MCV 95.0  PLT [id number]   Basic Metabolic Panel: Recent Labs  Lab 03/14/22 0551  NA 130*  K 3.1*  CL 85*  CO2 27  GLUCOSE 130*  BUN 41*  CREATININE 1.55*  CALCIUM 9.8   GFR: Estimated Creatinine Clearance: 45.5 mL/min (A) (by C-G formula based on SCr of 1.55 mg/dL (H)). Liver Function Tests: Recent Labs  Lab 03/14/22 0551  AST 33  ALT 26  ALKPHOS 85  BILITOT 1.9*  PROT 9.7*  ALBUMIN 4.9   Recent Labs  Lab 03/14/22 0551  LIPASE 32   No results for input(s): "AMMONIA" in the last 168 hours. Coagulation Profile: No results for input(s): "INR", "PROTIME" in the last 168 hours. Cardiac Enzymes: No results for input(s): "CKTOTAL", "CKMB", "CKMBINDEX", "TROPONINI" in the last 168 hours. BNP (last 3 results) No results for input(s): "PROBNP" in the last 8760 hours. HbA1C: No results for input(s): "HGBA1C" in the last 72 hours. CBG: No results for input(s): "GLUCAP" in the last 168 hours. Lipid Profile: No results for  input(s): "CHOL", "HDL", "LDLCALC", "TRIG", "CHOLHDL", "LDLDIRECT" in the last 72 hours. Thyroid Function Tests: No results for input(s): "TSH", "T4TOTAL", "FREET4", "T3FREE", "THYROIDAB" in the last 72 hours. Anemia Panel: No results for input(s): "VITAMINB12", "FOLATE", "FERRITIN", "TIBC", "IRON", "RETICCTPCT" in the last 72 hours. Urine analysis:    Component Value Date/Time   COLORURINE AMBER (A) 03/14/2022 0534   APPEARANCEUR CLOUDY (A) 03/14/2022 0534   LABSPEC 1.023 03/14/2022 0534   PHURINE 5.0 03/14/2022 0534   GLUCOSEU NEGATIVE 03/14/2022 0534   HGBUR NEGATIVE 03/14/2022 0534   BILIRUBINUR NEGATIVE 03/14/2022 0534   KETONESUR 5 (A) 03/14/2022 0534   PROTEINUR 100 (A) 03/14/2022 0534   UROBILINOGEN 0.2 05/17/2014 1348   NITRITE NEGATIVE 03/14/2022 0534   LEUKOCYTESUR LARGE (A) 03/14/2022 0534    Radiological Exams on Admission: DG Chest Portable  1 View  Result Date: 03/14/2022 CLINICAL DATA:  Cough Sepsis Mid abdominal pain EXAM: PORTABLE CHEST - 1 VIEW COMPARISON:  10/08/2015 FINDINGS: Cardiomediastinal silhouette and pulmonary vasculature are within normal limits. Lungs are hyperexpanded, but otherwise clear. IMPRESSION: No acute cardiopulmonary process. Electronically Signed   By: Miachel Roux M.D.   On: 03/14/2022 11:04   CT ABDOMEN PELVIS W CONTRAST  Result Date: 03/14/2022 CLINICAL DATA:  Abdominal pain. EXAM: CT ABDOMEN AND PELVIS WITH CONTRAST TECHNIQUE: Multidetector CT imaging of the abdomen and pelvis was performed using the standard protocol following bolus administration of intravenous contrast. RADIATION DOSE REDUCTION: This exam was performed according to the departmental dose-optimization program which includes automated exposure control, adjustment of the mA and/or kV according to patient size and/or use of iterative reconstruction technique. CONTRAST:  22mL OMNIPAQUE IOHEXOL 350 MG/ML SOLN COMPARISON:  CT abdomen pelvis dated July 19, 2021. FINDINGS: Lower  chest: Multiple small clustered peribronchovascular cystic changes in both lung bases again noted. Increased density involving one of these areas in the right lower lobe (series 5, image 5). Hepatobiliary: Multiple tiny subcentimeter low-density lesions in the liver are unchanged. No new focal liver abnormality. The gallbladder is unremarkable. No biliary dilatation. Pancreas: Unremarkable. No pancreatic ductal dilatation or surrounding inflammatory changes. Spleen: Normal in size without focal abnormality. Adrenals/Urinary Tract: The adrenal glands are unremarkable. Unchanged right renal calculi measuring up to 1.5 cm. No left renal calculi. No hydronephrosis. Bladder is unremarkable. Stomach/Bowel: Stomach is within normal limits. Appendix appears normal. No evidence of bowel wall thickening, distention, or inflammatory changes. Vascular/Lymphatic: Aortic atherosclerosis. No enlarged abdominal or pelvic lymph nodes. Reproductive: Prostate is unremarkable. Other: No abdominal wall hernia or abnormality. No abdominopelvic ascites. No pneumoperitoneum. Musculoskeletal: No acute or significant osseous findings. IMPRESSION: 1. No acute intra-abdominal process. 2. Unchanged nonobstructive right nephrolithiasis. 3. Multiple small clustered peribronchovascular cystic changes in both lung bases again noted, likely postinfectious or postinflammatory. Increased density involving one of these areas in the right lower lobe may reflect superimposed acute inflammation or infection. Follow-up noncontrast chest CT in 3 months is recommended. 4.  Aortic Atherosclerosis (ICD10-I70.0). Electronically Signed   By: Titus Dubin M.D.   On: 03/14/2022 10:25    EKG: ordered  Assessment/Plan Principal Problem:   Pyelonephritis Active Problems:   Acute pyelonephritis   AKI (acute kidney injury) ([place])  (please populate well all problems here in Problem List. (For example, if patient is on BP meds at home and you resume or decide  to hold them, it is a problem that needs to be her. Same for CAD, COPD, HLD and so on)  Intractable abdominal pain and bilateral flank pain -Suspect pyelonephritis -Continue ceftriaxone -Check PVR -Symptomatic management, patient is severely dehydrated, will continue IV fluid and start diet as tolerated.  Expect less than 24 hours of hospitalization. -Other DDx: might also has worsening of baseline gastritis/peptic ulcer, will increase PPI dosage and recommended him to follow-up with GI for outpatient EGD.  Acute pyelonephritis -Urine culture, continue ceftriaxone  AKI -Likely prerenal from N/V, continue with his IV fluid -CT abdomen pelvis referring  Hypokalemia -P.o. and IV replacement, recheck level tomorrow.  Hyponatremia -Hypovolemic, replenish volume then reevaluate sodium level  Bilateral lung nodules -Patient does not remember he had any pneumonia this year.  Recommend PCP follow-up and repeat CAT scan in 3 months.  Cigarette smoker -Cessation education performed at bedside  HTN -Resume home BP meds  DVT prophylaxis: Lovenox Code Status: Full code Family Communication: None at  bedside Disposition Plan: Expected less than 2 midnight hospital stay Consults called: None Admission status: MedSurg observation   Lequita Halt MD [facility] Pager [phone]  03/14/2022, 1:11 PM

## 2022-03-15 DIAGNOSIS — N179 Acute kidney failure, unspecified: Secondary | ICD-10-CM

## 2022-03-15 DIAGNOSIS — N10 Acute tubulo-interstitial nephritis: Secondary | ICD-10-CM

## 2022-03-15 LAB — BASIC METABOLIC PANEL
Anion gap: 11 (ref 5–15)
BUN: 15 mg/dL (ref 6–20)
CO2: 23 mmol/L (ref 22–32)
Calcium: 8.8 mg/dL — ABNORMAL LOW (ref 8.9–10.3)
Chloride: 100 mmol/L (ref 98–111)
Creatinine, Ser: 0.98 mg/dL (ref 0.61–1.24)
GFR, Estimated: >60 mL/min (ref >60)
Glucose, Bld: 88 mg/dL (ref 70–99)
Potassium: 4.3 mmol/L (ref 3.5–5.1)
Sodium: 134 mmol/L — ABNORMAL LOW (ref 135–145)

## 2022-03-15 LAB — CBC
HCT: 42.2 % (ref 39.0–52.0)
Hemoglobin: 14.4 g/dL (ref 13.0–17.0)
MCH: 32.4 pg (ref 26.0–34.0)
MCHC: 34.1 g/dL (ref 30.0–36.0)
MCV: 94.8 fL (ref 80.0–100.0)
Platelets: 199 10*3/uL (ref 150–400)
RBC: 4.45 MIL/uL (ref 4.22–5.81)
RDW: 12 % (ref 11.5–15.5)
WBC: 9.6 10*3/uL (ref 4.0–10.5)
nRBC: 0 % (ref 0.0–0.2)

## 2022-03-15 LAB — HIV ANTIBODY (ROUTINE TESTING W REFLEX): HIV Screen 4th Generation wRfx: NONREACTIVE

## 2022-03-15 MED ORDER — MORPHINE SULFATE (PF) 2 MG/ML IV SOLN: 2.0000 mg | INTRAVENOUS | Status: DC | PRN

## 2022-03-15 MED ORDER — TRAMADOL HCL 50 MG PO TABS
50.0000 mg | ORAL_TABLET | Freq: Four times a day (QID) | ORAL | Status: DC | PRN
  Administered 2022-03-15 – 2022-03-16 (×3): 50 mg via ORAL
  Filled 2022-03-15 (×3): qty 1

## 2022-03-15 MED ORDER — ALUM & MAG HYDROXIDE-SIMETH 200-200-20 MG/5ML PO SUSP: 30.0000 mL | ORAL | Status: DC | PRN

## 2022-03-15 NOTE — Plan of Care (Signed)
  Problem: Nutrition: Goal: Adequate nutrition will be maintained Outcome: Progressing   Problem: Safety: Goal: Ability to remain free from injury will improve Outcome: Progressing   

## 2022-03-15 NOTE — Progress Notes (Signed)
PROGRESS NOTE    Daniel [name]  [id number] DOB: [birth year] DOA: 03/14/2022 PCP: Elsie Stain, MD   Brief Narrative:  56 y.o. male with medical history significant of HTN, peptic ulcer presented with intractable nausea with vomiting abdominal pain and flank pain.  On presentation, CT abdomen pelvis showed unchanged nonobstructive right kidney stone, multiple small clustered cystic changes in both lungs, likely postinfection versus inflammatory.  UA was suspicious for UTI.  WBC 14, creatinine of 1.5.  He was started on IV fluids and antibiotics.  Assessment & Plan:   Intractable abdominal pain with vomiting Possible acute pyelonephritis Possible acute gastritis -Presented with severe intractable nausea with vomiting and abdominal pain.  Imaging as above. -Symptoms improving slightly. -Advance diet as tolerated.  Continue Rocephin.  Follow cultures.  Continue Protonix -Patient is supposed to have EGD as an outpatient in the near future  Elevated lactic acid -Possibly from above.  Improving.  AKI -From possibly prerenal from dehydration --improved.  Decrease normal saline to 75 cc an hour  Leukocytosis -Possibly reactive.  Resolved  Hyponatremia -Improving.  IV fluids plan as above  Hypokalemia -Resolved  Bilateral lung nodules -Patient does not remember he had any pneumonia this year.  Recommend PCP follow-up and repeat CAT scan in 3 months.   Cigarette smoker -Cessation education provided by admitting hospitalist   HTN -Continue amlodipine   DVT prophylaxis: Lovenox Code Status: Full Family Communication: None at bedside Disposition Plan: Status is: Observation The patient will require care spanning > 2 midnights and should be moved to inpatient because: Of severity of illness.  Need for IV fluids and antibiotics.  Consultants: None  Procedures: None  Antimicrobials: Rocephin from 03/14/2022 onwards   Subjective: Patient seen and examined at bedside.   Feels slightly better but still has intermittent nausea and some abdominal pain.  No fever, chest pain or worsening shortness of breath reported.  Objective: Vitals:   03/14/22 1522 03/14/22 1952 03/15/22 0355 03/15/22 0745  BP: (!) 131/94 (!) 145/78 (!) 143/78 132/73  Pulse: 68 62 65 63  Resp: 16 18 17 15   Temp: 98.1 F (36.7 C) 98.3 F (36.8 C) 98.4 F (36.9 C) 97.9 F (36.6 C)  TempSrc:   Oral Oral  SpO2: 99% 100% 100% 100%  Weight:      Height:        Intake/Output Summary (Last 24 hours) at 03/15/2022 1103 Last data filed at 03/15/2022 0800 Gross per 24 hour  Intake 1283.68 ml  Output 650 ml  Net 633.68 ml   Filed Weights   03/14/22 0534  Weight: 59 kg    Examination:  General exam: Appears calm and comfortable.  Currently on room air. Respiratory system: Bilateral decreased breath sounds at bases Cardiovascular system: S1 & S2 heard, Rate controlled Gastrointestinal system: Abdomen is nondistended, soft and mildly tender in the epigastric region.  Normal bowel sounds heard. Extremities: No cyanosis, clubbing, edema  Central nervous system: Alert and oriented. No focal neurological deficits. Moving extremities Skin: No rashes, lesions or ulcers Psychiatry: Mostly flat affect.  No signs of agitation.    Data Reviewed: I have personally reviewed following labs and imaging studies  CBC: Recent Labs  Lab 03/14/22 0551 03/15/22 0809  WBC 14.3* 9.6  NEUTROABS 11.6*  --   HGB 18.8* 14.4  HCT 55.3* 42.2  MCV 95.0 94.8  PLT 259 [id number]   Basic Metabolic Panel: Recent Labs  Lab 03/14/22 0551 03/15/22 0709  NA 130* 134*  K 3.1*  4.3  CL 85* 100  CO2 27 23  GLUCOSE 130* 88  BUN 41* 15  CREATININE 1.55* 0.98  CALCIUM 9.8 8.8*   GFR: Estimated Creatinine Clearance: 71.9 mL/min (by C-G formula based on SCr of 0.98 mg/dL). Liver Function Tests: Recent Labs  Lab 03/14/22 0551  AST 33  ALT 26  ALKPHOS 85  BILITOT 1.9*  PROT 9.7*  ALBUMIN 4.9    Recent Labs  Lab 03/14/22 0551  LIPASE 32   No results for input(s): "AMMONIA" in the last 168 hours. Coagulation Profile: No results for input(s): "INR", "PROTIME" in the last 168 hours. Cardiac Enzymes: No results for input(s): "CKTOTAL", "CKMB", "CKMBINDEX", "TROPONINI" in the last 168 hours. BNP (last 3 results) No results for input(s): "PROBNP" in the last 8760 hours. HbA1C: No results for input(s): "HGBA1C" in the last 72 hours. CBG: No results for input(s): "GLUCAP" in the last 168 hours. Lipid Profile: No results for input(s): "CHOL", "HDL", "LDLCALC", "TRIG", "CHOLHDL", "LDLDIRECT" in the last 72 hours. Thyroid Function Tests: No results for input(s): "TSH", "T4TOTAL", "FREET4", "T3FREE", "THYROIDAB" in the last 72 hours. Anemia Panel: No results for input(s): "VITAMINB12", "FOLATE", "FERRITIN", "TIBC", "IRON", "RETICCTPCT" in the last 72 hours. Sepsis Labs: Recent Labs  Lab 03/14/22 0844 03/14/22 1130  LATICACIDVEN 2.7* 2.4*    Recent Results (from the past 240 hour(s))  Blood culture (routine x 2)     Status: None (Preliminary result)   Collection Time: 03/14/22 10:23 AM   Specimen: BLOOD RIGHT FOREARM  Result Value Ref Range Status   Specimen Description BLOOD RIGHT FOREARM  Final   Special Requests   Final    BOTTLES DRAWN AEROBIC AND ANAEROBIC Blood Culture adequate volume   Culture   Final    NO GROWTH < 24 HOURS Performed at [facility], [address], [place], [place] [address]    Report Status PENDING  Incomplete         Radiology Studies: DG Chest Portable 1 View  Result Date: 03/14/2022 CLINICAL DATA:  Cough Sepsis Mid abdominal pain EXAM: PORTABLE CHEST - 1 VIEW COMPARISON:  10/08/2015 FINDINGS: Cardiomediastinal silhouette and pulmonary vasculature are within normal limits. Lungs are hyperexpanded, but otherwise clear. IMPRESSION: No acute cardiopulmonary process. Electronically Signed   By: Acquanetta Belling M.D.   On: 03/14/2022  11:04   CT ABDOMEN PELVIS W CONTRAST  Result Date: 03/14/2022 CLINICAL DATA:  Abdominal pain. EXAM: CT ABDOMEN AND PELVIS WITH CONTRAST TECHNIQUE: Multidetector CT imaging of the abdomen and pelvis was performed using the standard protocol following bolus administration of intravenous contrast. RADIATION DOSE REDUCTION: This exam was performed according to the departmental dose-optimization program which includes automated exposure control, adjustment of the mA and/or kV according to patient size and/or use of iterative reconstruction technique. CONTRAST:  64mL OMNIPAQUE IOHEXOL 350 MG/ML SOLN COMPARISON:  CT abdomen pelvis dated July 19, 2021. FINDINGS: Lower chest: Multiple small clustered peribronchovascular cystic changes in both lung bases again noted. Increased density involving one of these areas in the right lower lobe (series 5, image 5). Hepatobiliary: Multiple tiny subcentimeter low-density lesions in the liver are unchanged. No new focal liver abnormality. The gallbladder is unremarkable. No biliary dilatation. Pancreas: Unremarkable. No pancreatic ductal dilatation or surrounding inflammatory changes. Spleen: Normal in size without focal abnormality. Adrenals/Urinary Tract: The adrenal glands are unremarkable. Unchanged right renal calculi measuring up to 1.5 cm. No left renal calculi. No hydronephrosis. Bladder is unremarkable. Stomach/Bowel: Stomach is within normal limits. Appendix appears normal.  No evidence of bowel wall thickening, distention, or inflammatory changes. Vascular/Lymphatic: Aortic atherosclerosis. No enlarged abdominal or pelvic lymph nodes. Reproductive: Prostate is unremarkable. Other: No abdominal wall hernia or abnormality. No abdominopelvic ascites. No pneumoperitoneum. Musculoskeletal: No acute or significant osseous findings. IMPRESSION: 1. No acute intra-abdominal process. 2. Unchanged nonobstructive right nephrolithiasis. 3. Multiple small clustered peribronchovascular  cystic changes in both lung bases again noted, likely postinfectious or postinflammatory. Increased density involving one of these areas in the right lower lobe may reflect superimposed acute inflammation or infection. Follow-up noncontrast chest CT in 3 months is recommended. 4.  Aortic Atherosclerosis (ICD10-I70.0). Electronically Signed   By: Titus Dubin M.D.   On: 03/14/2022 10:25        Scheduled Meds:  acetaminophen  1,000 mg Oral Once   acidophilus  1 capsule Oral TID   amLODipine  5 mg Oral Daily   enoxaparin (LOVENOX) injection  40 mg Subcutaneous Q24H   ondansetron  4 mg Oral BID   pantoprazole  40 mg Oral BID AC   Continuous Infusions:  sodium chloride 150 mL/hr at 03/14/22 1804   cefTRIAXone (ROCEPHIN)  IV 2 g (03/15/22 0949)          Aline August, MD [facility] 03/15/2022, 11:03 AM

## 2022-03-15 NOTE — Progress Notes (Signed)
Patient refusing to stay in room, ambulating off floor. Saline locked IV and paused IVF. Notified MD.

## 2022-03-15 NOTE — Progress Notes (Signed)
Called to room by patient, patient requesting to ambulate on unit. Provided patient with education on safety and area to ambulate. On further discussion, patient expressed desire to ambulate outside to smoke. Offered to ask provider for nicotine patch order, patient refused. Encouraged patient to remain on unit until concerns and questions could be discussed with provider. Patient refused. Corrie Dandy, RN made aware and also discussed concerns of safety with patient. IVF was paused and saline locked as patient was leaving the unit. Dr. Hanley Ben notified via secure messaging of patients actions and concerns.

## 2022-03-15 NOTE — Progress Notes (Signed)
Patient returned to unit. IVF infusing. IV site c/d/I. Denies pain, respirations regular and unlabored. Safety precautions in place.

## 2022-03-16 LAB — COMPREHENSIVE METABOLIC PANEL
ALT: 18 U/L (ref 0–44)
AST: 22 U/L (ref 15–41)
Albumin: 3.2 g/dL — ABNORMAL LOW (ref 3.5–5.0)
Alkaline Phosphatase: 54 U/L (ref 38–126)
Anion gap: 3 — ABNORMAL LOW (ref 5–15)
BUN: 9 mg/dL (ref 6–20)
CO2: 29 mmol/L (ref 22–32)
Calcium: 8.9 mg/dL (ref 8.9–10.3)
Chloride: 104 mmol/L (ref 98–111)
Creatinine, Ser: 0.68 mg/dL (ref 0.61–1.24)
GFR, Estimated: >60 mL/min (ref >60)
Glucose, Bld: 98 mg/dL (ref 70–99)
Potassium: 4 mmol/L (ref 3.5–5.1)
Sodium: 136 mmol/L (ref 135–145)
Total Bilirubin: 0.6 mg/dL (ref 0.3–1.2)
Total Protein: 6.4 g/dL — ABNORMAL LOW (ref 6.5–8.1)

## 2022-03-16 LAB — MAGNESIUM: Magnesium: 1.8 mg/dL (ref 1.7–2.4)

## 2022-03-16 MED ORDER — ALUM & MAG HYDROXIDE-SIMETH 200-200-20 MG/5ML PO SUSP: 30.0000 mL | ORAL | 0 refills | Status: DC | PRN

## 2022-03-16 MED ORDER — PANTOPRAZOLE SODIUM 40 MG PO TBEC: 40.0000 mg | DELAYED_RELEASE_TABLET | Freq: Two times a day (BID) | ORAL | 0 refills | Status: DC

## 2022-03-16 MED ORDER — CEPHALEXIN 500 MG PO CAPS: 500.0000 mg | ORAL_CAPSULE | Freq: Three times a day (TID) | ORAL | 0 refills | Status: AC

## 2022-03-16 MED ORDER — TRAMADOL HCL 50 MG PO TABS: 50.0000 mg | ORAL_TABLET | Freq: Four times a day (QID) | ORAL | 0 refills | Status: DC | PRN

## 2022-03-16 NOTE — Plan of Care (Signed)

## 2022-03-16 NOTE — Plan of Care (Signed)
  Problem: Education: Goal: Knowledge of General Education information will improve Description: Including pain rating scale, medication(s)/side effects and non-pharmacologic comfort measures 03/16/2022 1036 by Earlie Raveling, RN Outcome: Adequate for Discharge 03/16/2022 1030 by Earlie Raveling, RN Outcome: Progressing   Problem: Health Behavior/Discharge Planning: Goal: Ability to manage health-related needs will improve 03/16/2022 1036 by Earlie Raveling, RN Outcome: Adequate for Discharge 03/16/2022 1030 by Earlie Raveling, RN Outcome: Progressing   Problem: Clinical Measurements: Goal: Ability to maintain clinical measurements within normal limits will improve 03/16/2022 1036 by Earlie Raveling, RN Outcome: Adequate for Discharge 03/16/2022 1030 by Earlie Raveling, RN Outcome: Progressing Goal: Will remain free from infection 03/16/2022 1036 by Earlie Raveling, RN Outcome: Adequate for Discharge 03/16/2022 1030 by Earlie Raveling, RN Outcome: Progressing Goal: Diagnostic test results will improve 03/16/2022 1036 by Earlie Raveling, RN Outcome: Adequate for Discharge 03/16/2022 1030 by Earlie Raveling, RN Outcome: Progressing Goal: Respiratory complications will improve 03/16/2022 1036 by Earlie Raveling, RN Outcome: Adequate for Discharge 03/16/2022 1030 by Earlie Raveling, RN Outcome: Progressing Goal: Cardiovascular complication will be avoided 03/16/2022 1036 by Earlie Raveling, RN Outcome: Adequate for Discharge 03/16/2022 1030 by Earlie Raveling, RN Outcome: Progressing   Problem: Activity: Goal: Risk for activity intolerance will decrease 03/16/2022 1036 by Earlie Raveling, RN Outcome: Adequate for Discharge 03/16/2022 1030 by Earlie Raveling, RN Outcome: Progressing   Problem: Nutrition: Goal: Adequate nutrition will be maintained 03/16/2022 1036 by Earlie Raveling, RN Outcome: Adequate for Discharge 03/16/2022 1030 by Earlie Raveling, RN Outcome: Progressing    Problem: Coping: Goal: Level of anxiety will decrease 03/16/2022 1036 by Earlie Raveling, RN Outcome: Adequate for Discharge 03/16/2022 1030 by Earlie Raveling, RN Outcome: Progressing   Problem: Elimination: Goal: Will not experience complications related to bowel motility 03/16/2022 1036 by Earlie Raveling, RN Outcome: Adequate for Discharge 03/16/2022 1030 by Earlie Raveling, RN Outcome: Progressing Goal: Will not experience complications related to urinary retention 03/16/2022 1036 by Earlie Raveling, RN Outcome: Adequate for Discharge 03/16/2022 1030 by Earlie Raveling, RN Outcome: Progressing   Problem: Pain Managment: Goal: General experience of comfort will improve 03/16/2022 1036 by Earlie Raveling, RN Outcome: Adequate for Discharge 03/16/2022 1030 by Earlie Raveling, RN Outcome: Progressing   Problem: Safety: Goal: Ability to remain free from injury will improve 03/16/2022 1036 by Earlie Raveling, RN Outcome: Adequate for Discharge 03/16/2022 1030 by Earlie Raveling, RN Outcome: Progressing   Problem: Skin Integrity: Goal: Risk for impaired skin integrity will decrease 03/16/2022 1036 by Earlie Raveling, RN Outcome: Adequate for Discharge 03/16/2022 1030 by Earlie Raveling, RN Outcome: Progressing

## 2022-03-16 NOTE — Discharge Summary (Signed)
Physician Discharge Summary  Cliff Rothfeld Mauney [id number] DOB: Aug 23, 1965 DOA: 03/14/2022  PCP: Elsie Stain, MD  Admit date: 03/14/2022 Discharge date: 03/16/2022  Admitted From: Home Disposition: Home  Recommendations for Outpatient Follow-up:  Follow up with PCP in 1 week with repeat CBC/BMP Outpatient follow-up with GI Follow up in ED if symptoms worsen or new appear   Home Health: No Equipment/Devices: None  Discharge Condition: Stable CODE STATUS: Full Diet recommendation: Heart healthy  Brief/Interim Summary: 56 y.o. male with medical history significant of HTN, peptic ulcer presented with intractable nausea with vomiting abdominal pain and flank pain.  On presentation, CT abdomen pelvis showed unchanged nonobstructive right kidney stone, multiple small clustered cystic changes in both lungs, likely postinfection versus inflammatory.  UA was suspicious for UTI.  WBC 14, creatinine of 1.5.  He was started on IV fluids and antibiotics.  During the hospitalization, his condition has improved.  He is currently tolerating diet and cultures have remained negative so far.  He wants to go home today.  He will be discharged today on oral Keflex.  Discharge Diagnoses:   Intractable abdominal pain with vomiting Possible acute pyelonephritis Possible acute gastritis -Presented with severe intractable nausea with vomiting and abdominal pain.  Imaging as above. -Symptoms have much improved -Currently tolerating diet.  Currently on Rocephin.  Cultures negative so far.  Currently on Protonix as well -Patient is supposed to have EGD as an outpatient in the near future -Feels much better.  Wants to go home today.  Discharge home today on oral Keflex for 7 more days.  Continue Protonix twice a day on discharge.  Outpatient follow-up with PCP and GI.   Elevated lactic acid -Possibly from above.  Improving.   AKI -From possibly prerenal from dehydration --improved.  Treated with IV  fluids.  Outpatient follow-up.  Leukocytosis -Possibly reactive.  Resolved   Hyponatremia -Treated with IV fluids.  Improved.  Hypokalemia -Resolved   Bilateral lung nodules -Patient does not remember he had any pneumonia this year.  Recommend PCP follow-up and repeat CAT scan in 3 months.   Cigarette smoker -Cessation education provided by admitting hospitalist -Patient Leaving the hospital multiple number of times during the hospitalization to smoke despite being told not to do so.   HTN -Continue amlodipine  Discharge Instructions  Discharge Instructions     Diet - low sodium heart healthy   Complete by: As directed    Increase activity slowly   Complete by: As directed       Allergies as of 03/16/2022   No Known Allergies      Medication List     STOP taking these medications    ibuprofen 200 MG tablet Commonly known as: ADVIL       TAKE these medications    alum & mag hydroxide-simeth 200-200-20 MG/5ML suspension Commonly known as: MAALOX/MYLANTA Take 30 mLs by mouth every 4 (four) hours as needed for indigestion or heartburn.   amLODipine 5 MG tablet Commonly known as: NORVASC Take 1 tablet (5 mg total) by mouth daily.   cephALEXin 500 MG capsule Commonly known as: KEFLEX Take 1 capsule (500 mg total) by mouth 3 (three) times daily for 7 days.   dicyclomine 20 MG tablet Commonly known as: BENTYL Take 1 tablet (20 mg total) by mouth 4 (four) times daily as needed for spasms.   ondansetron 4 MG tablet Commonly known as: ZOFRAN Take 1 tablet (4 mg total) by mouth 2 (two) times daily.  pantoprazole 40 MG tablet Commonly known as: PROTONIX Take 1 tablet (40 mg total) by mouth 2 (two) times daily before a meal. What changed: when to take this   traMADol 50 MG tablet Commonly known as: ULTRAM Take 1 tablet (50 mg total) by mouth every 6 (six) hours as needed for moderate pain.        Follow-up Information     [name], MD.  Schedule an appointment as soon as possible for a visit in 1 week(s).   Specialty: Pulmonary Disease Contact information: [address] [place] [address] [phone]                No Known Allergies  Consultations: None   Procedures/Studies: DG Chest Portable 1 View  Result Date: 03/14/2022 CLINICAL DATA:  Cough Sepsis Mid abdominal pain EXAM: PORTABLE CHEST - 1 VIEW COMPARISON:  10/08/2015 FINDINGS: Cardiomediastinal silhouette and pulmonary vasculature are within normal limits. Lungs are hyperexpanded, but otherwise clear. IMPRESSION: No acute cardiopulmonary process. Electronically Signed   By: Acquanetta Belling M.D.   On: 03/14/2022 11:04   CT ABDOMEN PELVIS W CONTRAST  Result Date: 03/14/2022 CLINICAL DATA:  Abdominal pain. EXAM: CT ABDOMEN AND PELVIS WITH CONTRAST TECHNIQUE: Multidetector CT imaging of the abdomen and pelvis was performed using the standard protocol following bolus administration of intravenous contrast. RADIATION DOSE REDUCTION: This exam was performed according to the departmental dose-optimization program which includes automated exposure control, adjustment of the mA and/or kV according to patient size and/or use of iterative reconstruction technique. CONTRAST:  25mL OMNIPAQUE IOHEXOL 350 MG/ML SOLN COMPARISON:  CT abdomen pelvis dated July 19, 2021. FINDINGS: Lower chest: Multiple small clustered peribronchovascular cystic changes in both lung bases again noted. Increased density involving one of these areas in the right lower lobe (series 5, image 5). Hepatobiliary: Multiple tiny subcentimeter low-density lesions in the liver are unchanged. No new focal liver abnormality. The gallbladder is unremarkable. No biliary dilatation. Pancreas: Unremarkable. No pancreatic ductal dilatation or surrounding inflammatory changes. Spleen: Normal in size without focal abnormality. Adrenals/Urinary Tract: The adrenal glands are unremarkable. Unchanged right  renal calculi measuring up to 1.5 cm. No left renal calculi. No hydronephrosis. Bladder is unremarkable. Stomach/Bowel: Stomach is within normal limits. Appendix appears normal. No evidence of bowel wall thickening, distention, or inflammatory changes. Vascular/Lymphatic: Aortic atherosclerosis. No enlarged abdominal or pelvic lymph nodes. Reproductive: Prostate is unremarkable. Other: No abdominal wall hernia or abnormality. No abdominopelvic ascites. No pneumoperitoneum. Musculoskeletal: No acute or significant osseous findings. IMPRESSION: 1. No acute intra-abdominal process. 2. Unchanged nonobstructive right nephrolithiasis. 3. Multiple small clustered peribronchovascular cystic changes in both lung bases again noted, likely postinfectious or postinflammatory. Increased density involving one of these areas in the right lower lobe may reflect superimposed acute inflammation or infection. Follow-up noncontrast chest CT in 3 months is recommended. 4.  Aortic Atherosclerosis (ICD10-I70.0). Electronically Signed   By: Obie Dredge M.D.   On: 03/14/2022 10:25      Subjective: Patient seen and examined at bedside.  Feels better and wants to go home today.  No fever, vomiting reported currently.  Abdominal pain is improving.  Tolerating diet.  Discharge Exam: Vitals:   03/16/22 0557 03/16/22 0736  BP: 130/75 134/72  Pulse: (!) 57 73  Resp: 16 18  Temp: 98.2 F (36.8 C) 98.2 F (36.8 C)  SpO2: 99% 98%    General: Pt is alert, awake, not in acute distress.  Poor historian.  Slow to respond. Cardiovascular:  rate controlled, S1/S2 + Respiratory: bilateral decreased breath sounds at bases Abdominal: Soft, NT, ND, bowel sounds + Extremities: no edema, no cyanosis    The results of significant diagnostics from this hospitalization (including imaging, microbiology, ancillary and laboratory) are listed below for reference.     Microbiology: Recent Results (from the past 240 hour(s))  Blood  culture (routine x 2)     Status: None (Preliminary result)   Collection Time: 03/14/22 10:23 AM   Specimen: BLOOD RIGHT FOREARM  Result Value Ref Range Status   Specimen Description BLOOD RIGHT FOREARM  Final   Special Requests   Final    BOTTLES DRAWN AEROBIC AND ANAEROBIC Blood Culture adequate volume   Culture   Final    NO GROWTH 2 DAYS Performed at [facility], [address] [place], [place] [address]    Report Status PENDING  Incomplete     Labs: BNP (last 3 results) No results for input(s): "BNP" in the last 8760 hours. Basic Metabolic Panel: Recent Labs  Lab 03/14/22 0551 03/15/22 0709 03/16/22 0611  NA 130* 134* 136  K 3.1* 4.3 4.0  CL 85* 100 104  CO2 27 23 29   GLUCOSE 130* 88 98  BUN 41* 15 9  CREATININE 1.55* 0.98 0.68  CALCIUM 9.8 8.8* 8.9  MG  --   --  1.8   Liver Function Tests: Recent Labs  Lab 03/14/22 0551 03/16/22 0611  AST 33 22  ALT 26 18  ALKPHOS 85 54  BILITOT 1.9* 0.6  PROT 9.7* 6.4*  ALBUMIN 4.9 3.2*   Recent Labs  Lab 03/14/22 0551  LIPASE 32   No results for input(s): "AMMONIA" in the last 168 hours. CBC: Recent Labs  Lab 03/14/22 0551 03/15/22 0809  WBC 14.3* 9.6  NEUTROABS 11.6*  --   HGB 18.8* 14.4  HCT 55.3* 42.2  MCV 95.0 94.8  PLT 259 199   Cardiac Enzymes: No results for input(s): "CKTOTAL", "CKMB", "CKMBINDEX", "TROPONINI" in the last 168 hours. BNP: Invalid input(s): "POCBNP" CBG: No results for input(s): "GLUCAP" in the last 168 hours. D-Dimer No results for input(s): "DDIMER" in the last 72 hours. Hgb A1c No results for input(s): "HGBA1C" in the last 72 hours. Lipid Profile No results for input(s): "CHOL", "HDL", "LDLCALC", "TRIG", "CHOLHDL", "LDLDIRECT" in the last 72 hours. Thyroid function studies No results for input(s): "TSH", "T4TOTAL", "T3FREE", "THYROIDAB" in the last 72 hours.  Invalid input(s): "FREET3" Anemia work up No results for input(s): "VITAMINB12", "FOLATE", "FERRITIN",  "TIBC", "IRON", "RETICCTPCT" in the last 72 hours. Urinalysis    Component Value Date/Time   COLORURINE AMBER (A) 03/14/2022 0534   APPEARANCEUR CLOUDY (A) 03/14/2022 0534   LABSPEC 1.023 03/14/2022 0534   PHURINE 5.0 03/14/2022 0534   GLUCOSEU NEGATIVE 03/14/2022 0534   HGBUR NEGATIVE 03/14/2022 0534   BILIRUBINUR NEGATIVE 03/14/2022 0534   KETONESUR 5 (A) 03/14/2022 0534   PROTEINUR 100 (A) 03/14/2022 0534   UROBILINOGEN 0.2 05/17/2014 1348   NITRITE NEGATIVE 03/14/2022 0534   LEUKOCYTESUR LARGE (A) 03/14/2022 0534   Sepsis Labs Recent Labs  Lab 03/14/22 0551 03/15/22 0809  WBC 14.3* 9.6   Microbiology Recent Results (from the past 240 hour(s))  Blood culture (routine x 2)     Status: None (Preliminary result)   Collection Time: 03/14/22 10:23 AM   Specimen: BLOOD RIGHT FOREARM  Result Value Ref Range Status   Specimen Description BLOOD RIGHT FOREARM  Final   Special Requests   Final  BOTTLES DRAWN AEROBIC AND ANAEROBIC Blood Culture adequate volume   Culture   Final    NO GROWTH 2 DAYS Performed at [facility], [place] [address] [place], [place] [address]    Report Status PENDING  Incomplete     Time coordinating discharge: 35 minutes  SIGNED:   Aline August, MD  [facility] 03/16/2022, 10:33 AM

## 2022-03-16 NOTE — Progress Notes (Signed)
Patient has been discharged. AVS given to patient and he verbalized understanding.

## 2022-03-19 ENCOUNTER — Telehealth: Payer: Self-pay

## 2022-03-19 LAB — CULTURE, BLOOD (ROUTINE X 2)
Culture: NO GROWTH
Special Requests: ADEQUATE

## 2022-03-19 NOTE — Telephone Encounter (Signed)
Transition Care Management Unsuccessful Follow-up Telephone Call  Date of discharge and from where:  03/16/2022, [facility]   Attempts:  1st Attempt  Reason for unsuccessful TCM follow-up call:  Left voice message- [phone], call back requested

## 2022-03-20 ENCOUNTER — Telehealth: Payer: Self-pay

## 2022-03-20 NOTE — Telephone Encounter (Signed)
Transition Care Management Follow-up Telephone Call Date of discharge and from where: 03/16/2022, [facility] How have you been since you were released from the hospital? He stated he is feeling fine and was at work at the time of this call.  Any questions or concerns? No  Items Reviewed: Did the pt receive and understand the discharge instructions provided? Yes  Medications obtained and verified? Yes - he said he has all of his medications and did not have any questions about the med regime  Other? No  Any new allergies since your discharge? No  Dietary orders reviewed? No Do you have support at home? Yes   Home Care and Equipment/Supplies: Were home health services ordered? no If so, what is the name of the agency? N/a  Has the agency set up a time to come to the patient's home? not applicable Were any new equipment or medical supplies ordered?  No What is the name of the medical supply agency? N/a Were you able to get the supplies/equipment? not applicable Do you have any questions related to the use of the equipment or supplies? No  Functional Questionnaire: (I = Independent and D = Dependent) ADLs: independent   Follow up appointments reviewed:  PCP Hospital f/u appt confirmed? Yes  Scheduled to see Dr Delford Field- 04/03/2022.  Specialist Hospital f/u appt confirmed?  None scheduled at this time   Are transportation arrangements needed? No  If their condition worsens, is the pt aware to call PCP or go to the Emergency Dept.? Yes Was the patient provided with contact information for the PCP's office or ED? Yes Was to pt encouraged to call back with questions or concerns? Yes

## 2022-04-03 ENCOUNTER — Ambulatory Visit: Payer: Self-pay | Admitting: Critical Care Medicine

## 2022-04-06 ENCOUNTER — Ambulatory Visit (INDEPENDENT_AMBULATORY_CARE_PROVIDER_SITE_OTHER): Payer: Self-pay | Admitting: Primary Care

## 2022-04-24 ENCOUNTER — Ambulatory Visit: Payer: Self-pay | Admitting: Critical Care Medicine

## 2022-07-12 IMAGING — CT CT ABD-PELV W/ CM
2 of 5 series · 14 of 46 positions shown, 16 images · IV contrast (agent unspecified)
Comparison: CT with IV contrast 11/11/2018, CT with IV and oral
contrast 11/11/2012

CLINICAL DATA: Abdominal pain after eating a hamburger earlier
today, with nausea.

EXAM:
CT ABDOMEN AND PELVIS WITH CONTRAST
TECHNIQUE: Multidetector CT imaging of the abdomen and pelvis was performed
using the standard protocol following bolus administration of
intravenous contrast.

[Series 2: axial st · axial · 0.77mm/px · z∈[-446,-76]mm · 11 of 88 slices shown, 13 images]
[im 7/88  soft-tissue]
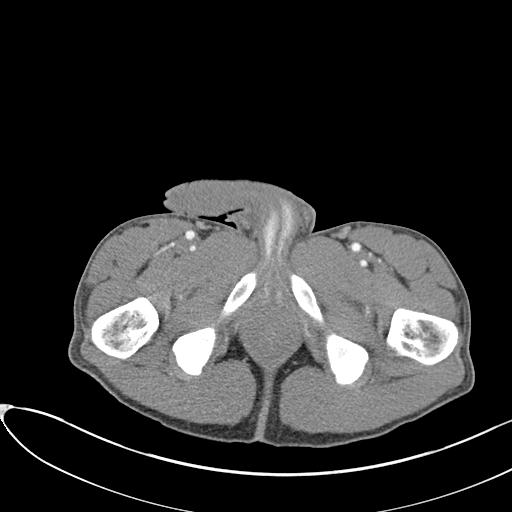
[im 7/88  bone]
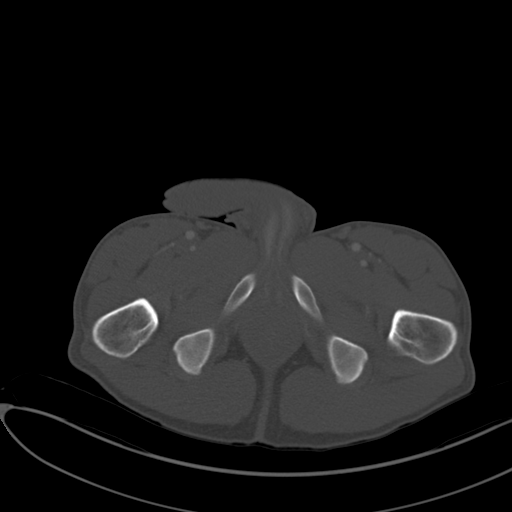
[im 13/88  soft-tissue]
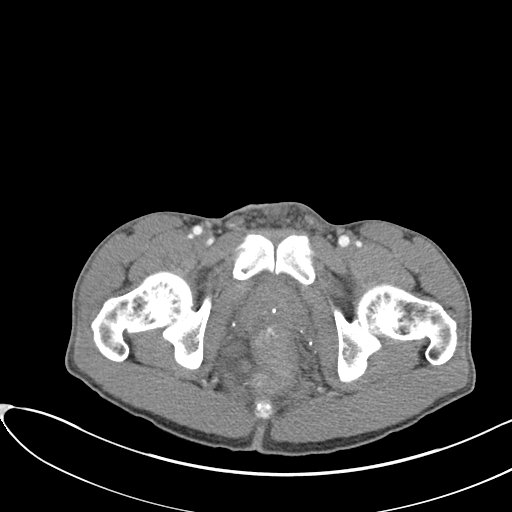
[im 19/88  soft-tissue]
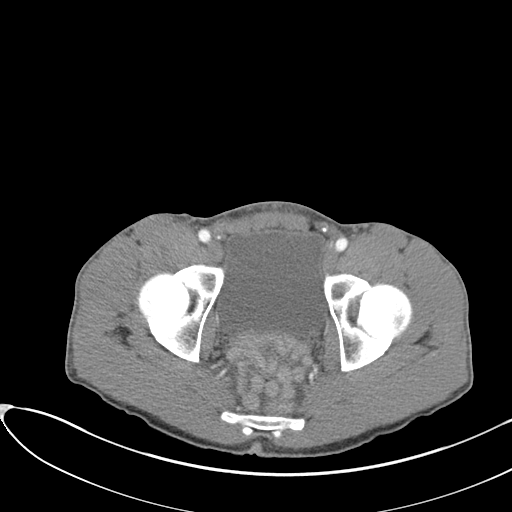
[im 32/88  soft-tissue]
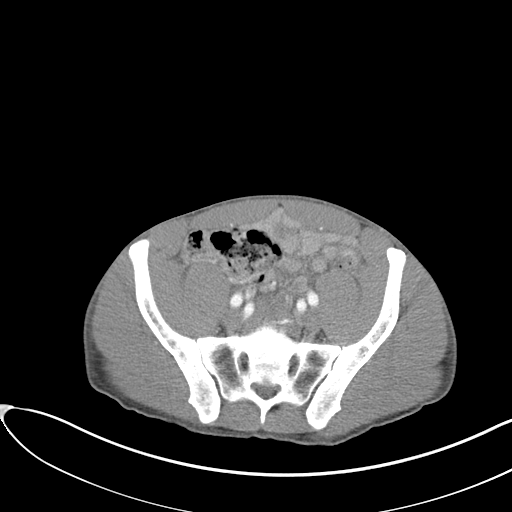
[im 38/88  soft-tissue]
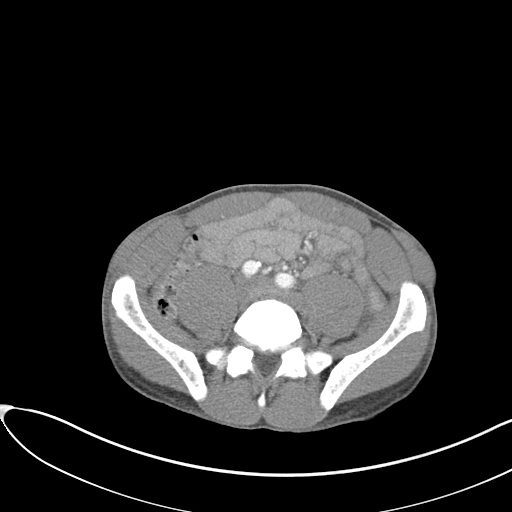
[im 44/88  soft-tissue]
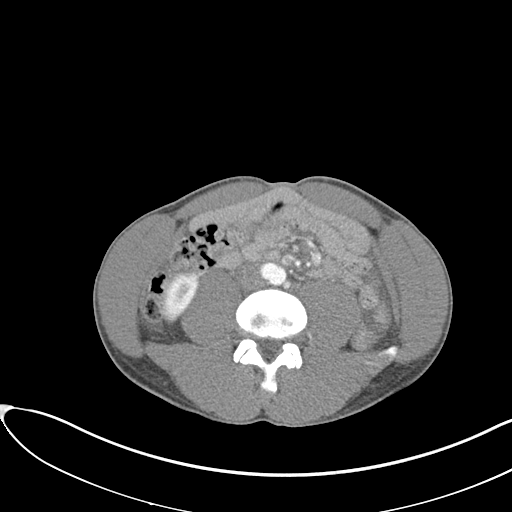
[im 50/88  soft-tissue]
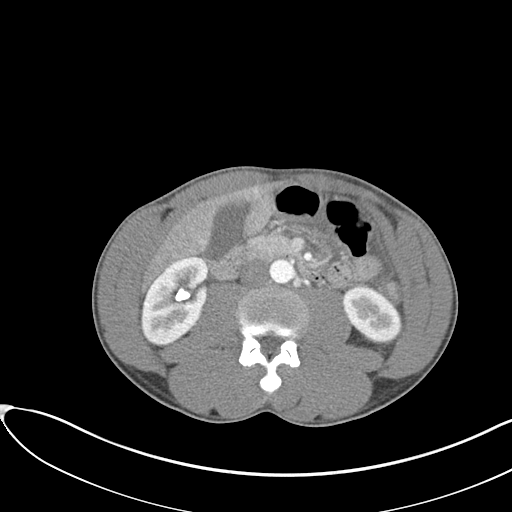
[im 56/88  soft-tissue]
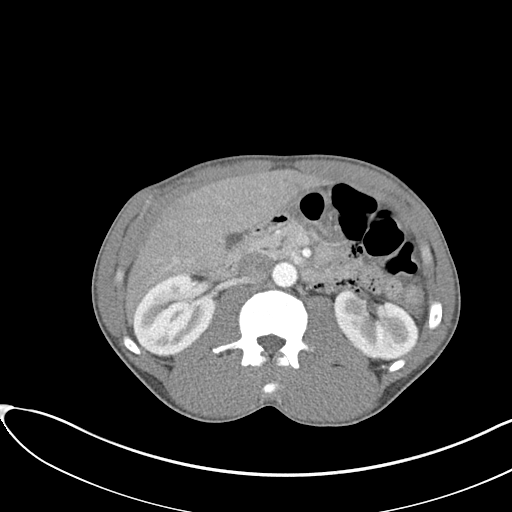
[im 69/88  soft-tissue]
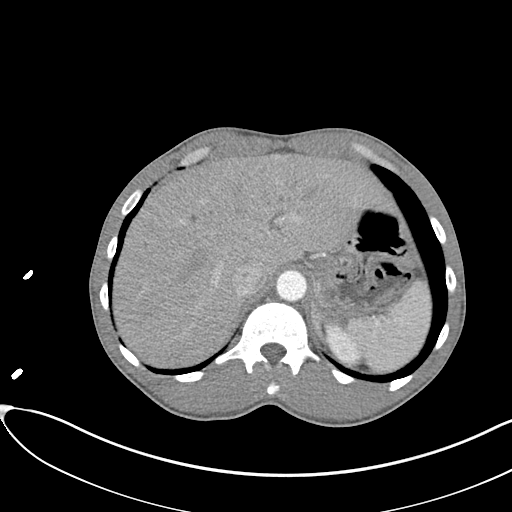
[im 69/88  bone]
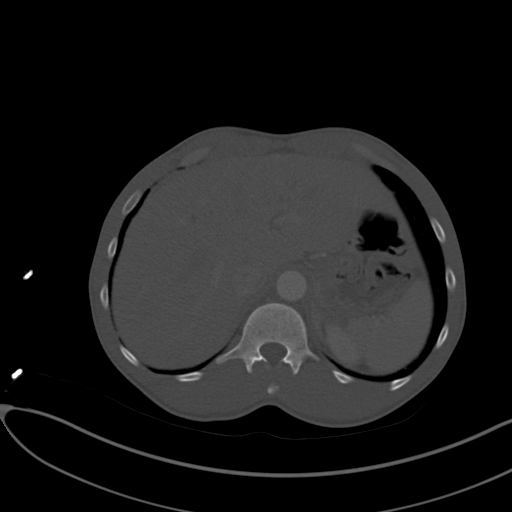
[im 75/88  soft-tissue]
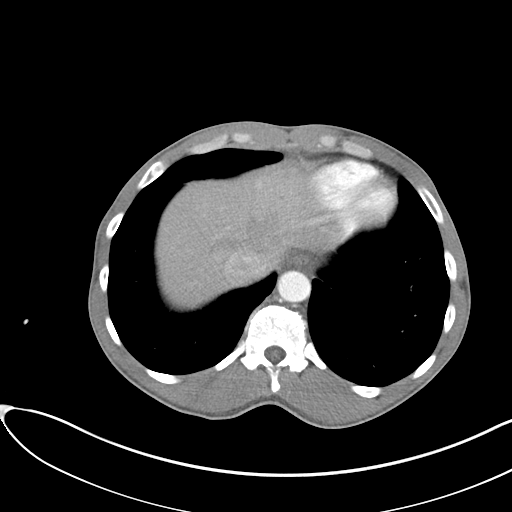
[im 81/88  soft-tissue]
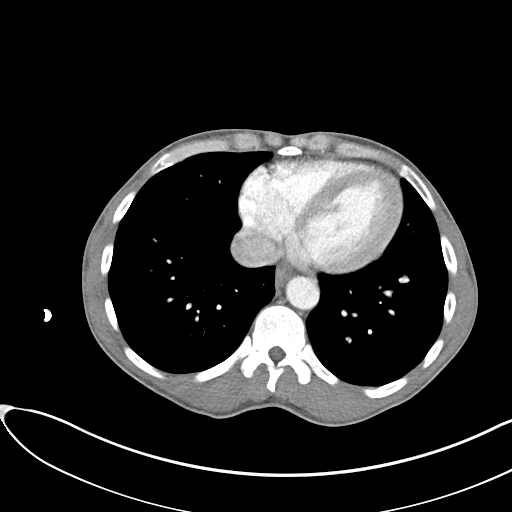

[Series 4: coronal st · coronal · 0.65mm/px · 3 of 132 slices shown]
[im 44/132  soft-tissue]
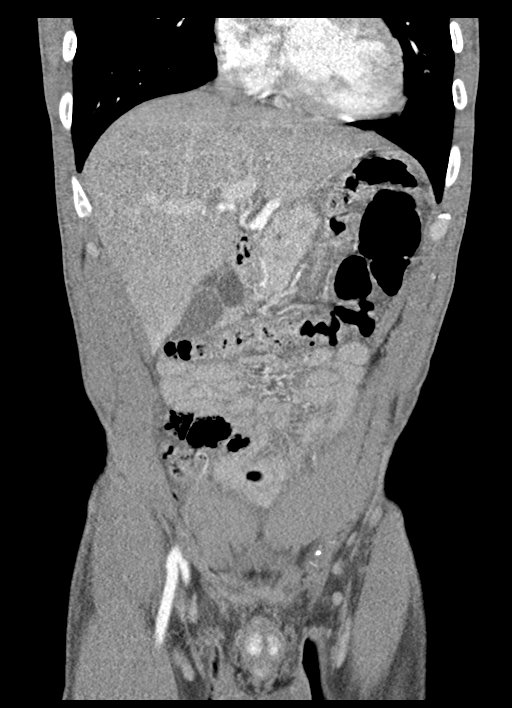
[im 59/132  soft-tissue]
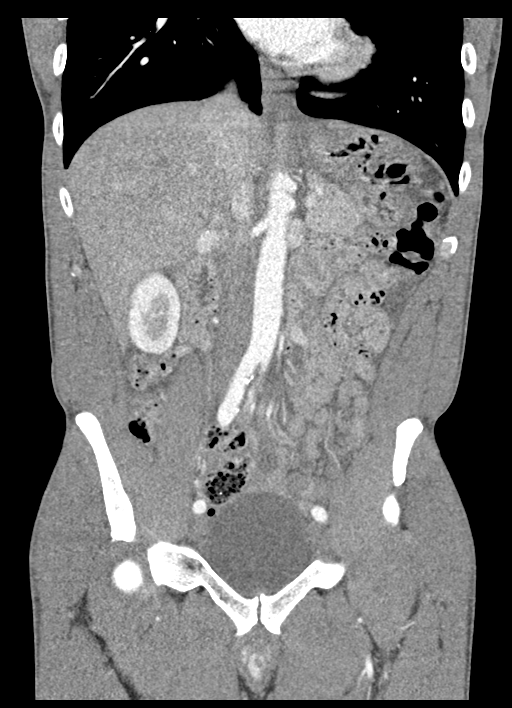
[im 73/132  soft-tissue]
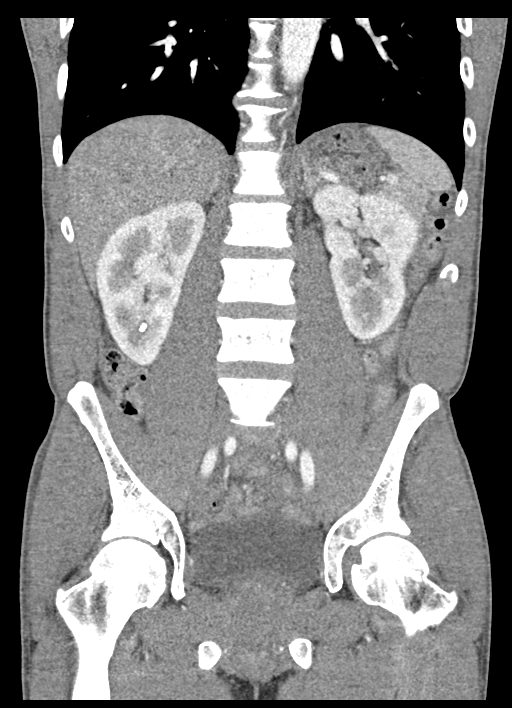

[14 of 46 positions shown; findings below may reference images not displayed]

RADIATION DOSE REDUCTION: This exam was performed according to the
departmental dose-optimization program which includes automated
exposure control, adjustment of the mA and/or kV according to
patient size and/or use of iterative reconstruction technique.

CONTRAST:  100mL OMNIPAQUE IOHEXOL 300 MG/ML  SOLN
FINDINGS: Lower chest: There are small scattered areas of peribronchovascular
clustered microcystic change in the left-greater-than-right lower
lobes, 2 small peripheral areas of similar microcystic change in the
right middle lobe.

These findings were seen previously probably reflecting sequela of
either a chronic inflammatory process or post pneumonic residua.

Lung bases demonstrate no acute process. The cardiac size is normal.

Hepatobiliary: Liver again demonstrates several scattered tiny
hypodensities which are too small to characterize which show no
interval change in appearance and numbers. There is no mass
enhancement. The gallbladder and bile ducts are unremarkable.

Pancreas: No focal abnormality or ductal dilatation. The
peripancreatic fat planes are not well seen due to a general paucity
of body fat but there is no peripancreatic fluid.

Spleen: Unremarkable.

Adrenals/Urinary Tract: No focal adrenal or renal cortical mass
enhancement. Small cyst or caliceal diverticulum again noted in the
parapelvic right lower pole as well as nonobstructing lower pole
right renal caliceal stones up to 1.5 cm. No hydronephrosis, left
intrarenal stones or ureteral stones are seen. The bladder thickness
is normal.

Stomach/Bowel: There are thickened folds in the stomach, mild
thickened folds in the left abdominal small bowel. No small bowel
dilatation or inflammation is seen. The appendix is distally
obscured by overlapping structures but the proximal appendix is
normal caliber. There is mild wall thickening or nondistention in
the distal descending and sigmoid colon.

Vascular/Lymphatic: Aortoiliac atherosclerosis again noted without
aneurysm. No portal vein dilatation. No adenopathy is seen allowing
for limited body fat.

Reproductive: Mild prominence of the prostate measuring 4.5 cm
transversely.

Other: Small amount of posterior deep pelvic ascites is again noted,
similar to 2929. No free hemorrhage, abscess or free air is seen.

Musculoskeletal: There is new demonstration of partially healed
subacute fractures of the left L2 and L3 transverse processes. There
is degenerative disc change with spondylosis at L5-S1. No acute
osseous findings.
IMPRESSION: 1. Descending and sigmoid colitis versus nondistention.
2. Gastroenteritis without evidence of bowel obstruction or
inflammatory change.
3. Nonobstructive nephrolithiasis.
4. Small amount of ascites in the posterior deep pelvis, similar to
2929, nonspecific but unusual in males.
5. Nonobstructive right-sided nephrolithiasis.
6. Stable too small to characterize scattered hepatic hypodensities.
No mass enhancement.
7. Subacute partially healed transverse process fractures on the
left at L2 and 3.
8. Appendix obscured distally but normal where seen.

## 2022-11-19 ENCOUNTER — Emergency Department (HOSPITAL_COMMUNITY): Payer: 59

## 2022-11-19 ENCOUNTER — Other Ambulatory Visit: Payer: Self-pay

## 2022-11-19 ENCOUNTER — Encounter (HOSPITAL_COMMUNITY): Payer: Self-pay

## 2022-11-19 ENCOUNTER — Observation Stay (HOSPITAL_COMMUNITY)
Admission: EM | Admit: 2022-11-19 | Discharge: 2022-11-20 | Disposition: A | Discharge status: Home / Self Care | Payer: 59 | Attending: Internal Medicine | Admitting: Internal Medicine

## 2022-11-19 DIAGNOSIS — K3189 Other diseases of stomach and duodenum: Secondary | ICD-10-CM

## 2022-11-19 DIAGNOSIS — D72829 Elevated white blood cell count, unspecified: Secondary | ICD-10-CM | POA: Insufficient documentation

## 2022-11-19 DIAGNOSIS — R069 Unspecified abnormalities of breathing: Secondary | ICD-10-CM | POA: Diagnosis not present

## 2022-11-19 DIAGNOSIS — R110 Nausea: Secondary | ICD-10-CM | POA: Diagnosis not present

## 2022-11-19 DIAGNOSIS — I10 Essential (primary) hypertension: Secondary | ICD-10-CM | POA: Diagnosis not present

## 2022-11-19 DIAGNOSIS — Z79899 Other long term (current) drug therapy: Secondary | ICD-10-CM | POA: Insufficient documentation

## 2022-11-19 DIAGNOSIS — N200 Calculus of kidney: Secondary | ICD-10-CM

## 2022-11-19 DIAGNOSIS — R1013 Epigastric pain: Secondary | ICD-10-CM | POA: Diagnosis not present

## 2022-11-19 DIAGNOSIS — R1033 Periumbilical pain: Secondary | ICD-10-CM | POA: Diagnosis not present

## 2022-11-19 DIAGNOSIS — N2889 Other specified disorders of kidney and ureter: Secondary | ICD-10-CM | POA: Diagnosis not present

## 2022-11-19 DIAGNOSIS — F17210 Nicotine dependence, cigarettes, uncomplicated: Secondary | ICD-10-CM | POA: Diagnosis not present

## 2022-11-19 DIAGNOSIS — R112 Nausea with vomiting, unspecified: Secondary | ICD-10-CM | POA: Diagnosis not present

## 2022-11-19 DIAGNOSIS — R1084 Generalized abdominal pain: Secondary | ICD-10-CM | POA: Diagnosis not present

## 2022-11-19 LAB — URINALYSIS, ROUTINE W REFLEX MICROSCOPIC
Bilirubin Urine: NEGATIVE
Glucose, UA: NEGATIVE mg/dL
Ketones, ur: NEGATIVE mg/dL
Leukocytes,Ua: NEGATIVE
Nitrite: NEGATIVE
Protein, ur: NEGATIVE mg/dL
Specific Gravity, Urine: 1.017 (ref 1.005–1.030)
pH: 8 (ref 5.0–8.0)

## 2022-11-19 LAB — COMPREHENSIVE METABOLIC PANEL
ALT: 22 U/L (ref 0–44)
AST: 28 U/L (ref 15–41)
Albumin: 4.1 g/dL (ref 3.5–5.0)
Alkaline Phosphatase: 70 U/L (ref 38–126)
Anion gap: 12 (ref 5–15)
BUN: 7 mg/dL (ref 6–20)
CO2: 21 mmol/L — ABNORMAL LOW (ref 22–32)
Calcium: 9 mg/dL (ref 8.9–10.3)
Chloride: 107 mmol/L (ref 98–111)
Creatinine, Ser: 0.68 mg/dL (ref 0.61–1.24)
GFR, Estimated: >60 mL/min (ref >60)
Glucose, Bld: 106 mg/dL — ABNORMAL HIGH (ref 70–99)
Potassium: 3.9 mmol/L (ref 3.5–5.1)
Sodium: 140 mmol/L (ref 135–145)
Total Bilirubin: 0.3 mg/dL (ref 0.3–1.2)
Total Protein: 8.5 g/dL — ABNORMAL HIGH (ref 6.5–8.1)

## 2022-11-19 LAB — TROPONIN I (HIGH SENSITIVITY)
Troponin I (High Sensitivity): 7 ng/L (ref <18)
Troponin I (High Sensitivity): 7 ng/L (ref <18)

## 2022-11-19 LAB — CBC WITH DIFFERENTIAL/PLATELET
Abs Immature Granulocytes: 0.03 10*3/uL (ref 0.00–0.07)
Basophils Absolute: 0 10*3/uL (ref 0.0–0.1)
Basophils Relative: 0 %
Eosinophils Absolute: 0.1 10*3/uL (ref 0.0–0.5)
Eosinophils Relative: 1 %
HCT: 46.4 % (ref 39.0–52.0)
Hemoglobin: 15 g/dL (ref 13.0–17.0)
Immature Granulocytes: 0 %
Lymphocytes Relative: 12 %
Lymphs Abs: 1.4 10*3/uL (ref 0.7–4.0)
MCH: 31.1 pg (ref 26.0–34.0)
MCHC: 32.3 g/dL (ref 30.0–36.0)
MCV: 96.3 fL (ref 80.0–100.0)
Monocytes Absolute: 0.4 10*3/uL (ref 0.1–1.0)
Monocytes Relative: 3 %
Neutro Abs: 9.5 10*3/uL — ABNORMAL HIGH (ref 1.7–7.7)
Neutrophils Relative %: 84 %
Platelets: 252 10*3/uL (ref 150–400)
RBC: 4.82 MIL/uL (ref 4.22–5.81)
RDW: 13.2 % (ref 11.5–15.5)
WBC: 11.4 10*3/uL — ABNORMAL HIGH (ref 4.0–10.5)
nRBC: 0 % (ref 0.0–0.2)

## 2022-11-19 LAB — LACTIC ACID, PLASMA: Lactic Acid, Venous: 1.4 mmol/L (ref 0.5–1.9)

## 2022-11-19 LAB — LIPASE, BLOOD: Lipase: 33 U/L (ref 11–51)

## 2022-11-19 LAB — MAGNESIUM: Magnesium: 1.9 mg/dL (ref 1.7–2.4)

## 2022-11-19 MED ORDER — AMLODIPINE BESYLATE 5 MG PO TABS
5.0000 mg | ORAL_TABLET | Freq: Every day | ORAL | Status: DC
  Administered 2022-11-20: 5 mg via ORAL
  Filled 2022-11-19: qty 1

## 2022-11-19 MED ORDER — PANTOPRAZOLE SODIUM 40 MG IV SOLR
40.0000 mg | INTRAVENOUS | Status: DC
  Administered 2022-11-19: 40 mg via INTRAVENOUS
  Filled 2022-11-19: qty 10

## 2022-11-19 MED ORDER — ALBUTEROL SULFATE (2.5 MG/3ML) 0.083% IN NEBU: 2.5000 mg | INHALATION_SOLUTION | RESPIRATORY_TRACT | Status: DC | PRN

## 2022-11-19 MED ORDER — SODIUM CHLORIDE (PF) 0.9 % IJ SOLN
INTRAMUSCULAR | Status: AC
  Filled 2022-11-19: qty 50

## 2022-11-19 MED ORDER — HYDROMORPHONE HCL 1 MG/ML IJ SOLN
1.0000 mg | Freq: Once | INTRAMUSCULAR | Status: AC
  Administered 2022-11-19: 1 mg via INTRAVENOUS
  Filled 2022-11-19: qty 1

## 2022-11-19 MED ORDER — PANTOPRAZOLE SODIUM 40 MG IV SOLR
40.0000 mg | Freq: Once | INTRAVENOUS | Status: AC
  Administered 2022-11-19: 40 mg via INTRAVENOUS
  Filled 2022-11-19: qty 10

## 2022-11-19 MED ORDER — TRAZODONE HCL 50 MG PO TABS
25.0000 mg | ORAL_TABLET | Freq: Every evening | ORAL | Status: DC | PRN
  Administered 2022-11-19: 25 mg via ORAL
  Filled 2022-11-19: qty 1

## 2022-11-19 MED ORDER — LACTATED RINGERS IV BOLUS
1000.0000 mL | Freq: Once | INTRAVENOUS | Status: AC
  Administered 2022-11-19: 1000 mL via INTRAVENOUS

## 2022-11-19 MED ORDER — ONDANSETRON HCL 4 MG PO TABS: 4.0000 mg | ORAL_TABLET | Freq: Four times a day (QID) | ORAL | Status: DC | PRN

## 2022-11-19 MED ORDER — ONDANSETRON HCL 4 MG/2ML IJ SOLN
4.0000 mg | Freq: Once | INTRAMUSCULAR | Status: AC
  Administered 2022-11-19: 4 mg via INTRAVENOUS
  Filled 2022-11-19: qty 2

## 2022-11-19 MED ORDER — ENOXAPARIN SODIUM 40 MG/0.4ML IJ SOSY
40.0000 mg | PREFILLED_SYRINGE | INTRAMUSCULAR | Status: DC
  Administered 2022-11-19: 40 mg via SUBCUTANEOUS
  Filled 2022-11-19: qty 0.4

## 2022-11-19 MED ORDER — ONDANSETRON HCL 4 MG/2ML IJ SOLN: 4.0000 mg | Freq: Four times a day (QID) | INTRAMUSCULAR | Status: DC | PRN

## 2022-11-19 MED ORDER — HYDRALAZINE HCL 20 MG/ML IJ SOLN: 5.0000 mg | Freq: Four times a day (QID) | INTRAMUSCULAR | Status: DC | PRN

## 2022-11-19 MED ORDER — IOHEXOL 300 MG/ML  SOLN
100.0000 mL | Freq: Once | INTRAMUSCULAR | Status: AC | PRN
  Administered 2022-11-19: 100 mL via INTRAVENOUS

## 2022-11-19 MED ORDER — SODIUM CHLORIDE 0.9 % IV SOLN: INTRAVENOUS | Status: DC

## 2022-11-19 MED ORDER — MORPHINE SULFATE (PF) 2 MG/ML IV SOLN
2.0000 mg | INTRAVENOUS | Status: DC | PRN
  Administered 2022-11-19: 2 mg via INTRAVENOUS
  Filled 2022-11-19: qty 1

## 2022-11-19 NOTE — ED Notes (Signed)
ED TO INPATIENT HANDOFF REPORT  Name/Age/Gender Daniel [name] 57 y.o. male  Code Status    Code Status Orders  (From admission, onward)           Start     Ordered   11/19/22 1207  Full code  Continuous       Question:  By:  Answer:  Consent: discussion documented in EHR   11/19/22 1207           Code Status History     Date Active Date Inactive Code Status Order ID Comments User Context   03/14/2022 1306 03/16/2022 1602 Full Code [id number]  Emeline General, MD ED   11/11/2012 1728 11/12/2012 1911 Full Code [id number]  Catarina Hartshorn, MD Inpatient       Home/SNF/Other Home        Chief Complaint Intractable vomiting with nausea [R11.2]  Level of Care/Admitting Diagnosis ED Disposition     ED Disposition  Admit   Condition  --   Comment  Hospital Area: [facility] [[id number]]  Level of Care: Med-Surg [16]  May place patient in observation at [facility] or [facility] if equivalent level of care is available:: Yes  Covid Evaluation: Asymptomatic - no recent exposure (last 10 days) testing not required  Diagnosis: Intractable vomiting with nausea [2725366]  Admitting Physician: Maryln Gottron [[id number]]  Attending Physician: Kirby Crigler, MIR [email or web address] [[id number]]          Medical History Past Medical History:  Diagnosis Date   Anxiety    Chronic lower back pain    Depression    Internal hemorrhoids with other complication    Nephrolithiasis    Rectal abscess     Allergies No Known Allergies  IV Location/Drains/Wounds Patient Lines/Drains/Airways Status     Active Line/Drains/Airways     Name Placement date Placement time Site Days   Peripheral IV 11/19/22 Anterior;Right Forearm 11/19/22  0631  Forearm  less than 1            Labs/Imaging Results for orders placed or performed during the hospital encounter of 11/19/22 (from the past 48 hour(s))  Comprehensive metabolic panel     Status: Abnormal   Collection Time: 11/19/22  6:27  AM  Result Value Ref Range   Sodium 140 135 - 145 mmol/L   Potassium 3.9 3.5 - 5.1 mmol/L   Chloride 107 98 - 111 mmol/L   CO2 21 (L) 22 - 32 mmol/L   Glucose, Bld 106 (H) 70 - 99 mg/dL    Comment: Glucose reference range applies only to samples taken after fasting for at least 8 hours.   BUN 7 6 - 20 mg/dL   Creatinine, Ser 7.56 0.61 - 1.24 mg/dL   Calcium 9.0 8.9 - 43.3 mg/dL   Total Protein 8.5 (H) 6.5 - 8.1 g/dL   Albumin 4.1 3.5 - 5.0 g/dL   AST 28 15 - 41 U/L   ALT 22 0 - 44 U/L   Alkaline Phosphatase 70 38 - 126 U/L   Total Bilirubin 0.3 0.3 - 1.2 mg/dL   GFR, Estimated >29 >51 mL/min    Comment: (NOTE) Calculated using the CKD-EPI Creatinine Equation (2021)    Anion gap 12 5 - 15    Comment: Performed at [facility], [address] [place], [place] [address]  Lipase, blood     Status: None   Collection Time: 11/19/22  6:27 AM  Result Value Ref Range   Lipase  33 11 - 51 U/L    Comment: Performed at [facility], [address], [place], [place] [address]  CBC with Diff     Status: Abnormal   Collection Time: 11/19/22  6:27 AM  Result Value Ref Range   WBC 11.4 (H) 4.0 - 10.5 K/uL   RBC 4.82 4.22 - 5.81 MIL/uL   Hemoglobin 15.0 13.0 - 17.0 g/dL   HCT 95.2 84.1 - 32.4 %   MCV 96.3 80.0 - 100.0 fL   MCH 31.1 26.0 - 34.0 pg   MCHC 32.3 30.0 - 36.0 g/dL   RDW 40.1 02.7 - 25.3 %   Platelets 252 150 - 400 K/uL   nRBC 0.0 0.0 - 0.2 %   Neutrophils Relative % 84 %   Neutro Abs 9.5 (H) 1.7 - 7.7 K/uL   Lymphocytes Relative 12 %   Lymphs Abs 1.4 0.7 - 4.0 K/uL   Monocytes Relative 3 %   Monocytes Absolute 0.4 0.1 - 1.0 K/uL   Eosinophils Relative 1 %   Eosinophils Absolute 0.1 0.0 - 0.5 K/uL   Basophils Relative 0 %   Basophils Absolute 0.0 0.0 - 0.1 K/uL   Immature Granulocytes 0 %   Abs Immature Granulocytes 0.03 0.00 - 0.07 K/uL    Comment: Performed at [facility], [address] [place], [place]  [address]  Troponin I (High Sensitivity)     Status: None   Collection Time: 11/19/22  6:27 AM  Result Value Ref Range   Troponin I (High Sensitivity) 7 <18 ng/L    Comment: (NOTE) Elevated high sensitivity troponin I (hsTnI) values and significant  changes across serial measurements may suggest ACS but many other  chronic and acute conditions are known to elevate hsTnI results.  Refer to the "Links" section for chest pain algorithms and additional  guidance. Performed at [facility], [address], [place], [place] [address]   Magnesium     Status: None   Collection Time: 11/19/22  6:27 AM  Result Value Ref Range   Magnesium 1.9 1.7 - 2.4 mg/dL    Comment: Performed at [facility], [address], [place], [place] [address]  Urinalysis, Routine w reflex microscopic -Urine, Clean Catch     Status: Abnormal   Collection Time: 11/19/22  8:13 AM  Result Value Ref Range   Color, Urine YELLOW YELLOW   APPearance CLOUDY (A) CLEAR   Specific Gravity, Urine 1.017 1.005 - 1.030   pH 8.0 5.0 - 8.0   Glucose, UA NEGATIVE NEGATIVE mg/dL   Hgb urine dipstick SMALL (A) NEGATIVE   Bilirubin Urine NEGATIVE NEGATIVE   Ketones, ur NEGATIVE NEGATIVE mg/dL   Protein, ur NEGATIVE NEGATIVE mg/dL   Nitrite NEGATIVE NEGATIVE   Leukocytes,Ua NEGATIVE NEGATIVE   RBC / HPF 21-50 0 - 5 RBC/hpf   WBC, UA 6-10 0 - 5 WBC/hpf   Bacteria, UA RARE (A) NONE SEEN   Squamous Epithelial / HPF 0-5 0 - 5 /HPF    Comment: Performed at [facility], [address], [place], [place] [address]  Troponin I (High Sensitivity)     Status: None   Collection Time: 11/19/22  8:13 AM  Result Value Ref Range   Troponin I (High Sensitivity) 7 <18 ng/L    Comment: (NOTE) Elevated high sensitivity troponin I (hsTnI) values and significant  changes across serial measurements may suggest ACS but many other  chronic and acute conditions are  known to elevate hsTnI results.   Refer to the "Links" section for chest pain algorithms and additional  guidance. Performed at [facility], [address] [place], [place] [address]   Lactic acid, plasma     Status: None   Collection Time: 11/19/22  8:13 AM  Result Value Ref Range   Lactic Acid, Venous 1.4 0.5 - 1.9 mmol/L    Comment: Performed at [facility], [address], [place], [place] [address]   CT ABDOMEN PELVIS W CONTRAST  Result Date: 11/19/2022 CLINICAL DATA:  Generalized abdominal pain, nausea, vomiting and diarrhea. EXAM: CT ABDOMEN AND PELVIS WITH CONTRAST TECHNIQUE: Multidetector CT imaging of the abdomen and pelvis was performed using the standard protocol following bolus administration of intravenous contrast. RADIATION DOSE REDUCTION: This exam was performed according to the departmental dose-optimization program which includes automated exposure control, adjustment of the mA and/or kV according to patient size and/or use of iterative reconstruction technique. CONTRAST:  OMNIPAQUE IOHEXOL 300 MG/ML  SOLN COMPARISON:  03/14/2022 FINDINGS: Lower chest: Stable peribronchial cystic changes at both lung bases, especially on the left. No pulmonary consolidation or pleural fluid at the lung bases. Hepatobiliary: Stable tiny scattered hepatic cysts. Normal appearance of the gallbladder. No significant biliary ductal dilatation. Pancreas: Unremarkable. No pancreatic ductal dilatation or surrounding inflammatory changes. Spleen: Normal in size without focal abnormality. 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 Additional 7 mm calculus in a dilated lower pole calyx. Correlation suggested with urinalysis as chronic relative lower pole obstruction may predispose to chronic infection. No evidence  additional calculi or bladder abnormality. No adrenal masses identified. Stomach/Bowel: There is chronic edema the stomach with some wall thickening of the antrum. This may be slightly more prominent compared to the 2023 study and is consistent with gastritis. No overt ulceration or perforation identified. No evidence of gastric outlet obstruction. Bowel shows no evidence of obstruction, ileus or lesion. The appendix is normal. No free intraperitoneal air. Vascular/Lymphatic: No significant vascular findings are present. No enlarged abdominal or pelvic lymph nodes. Reproductive: Prostate is unremarkable. Other: No abdominal wall hernia or abnormality. No abdominopelvic ascites. Musculoskeletal: No acute or significant osseous findings. IMPRESSION: 1. Chronic edema of the stomach with some wall thickening of the antrum. This may be slightly more prominent compared to the 2023 study and is consistent with gastritis. No overt ulceration or perforation identified. 2. 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 Additional 7 mm calculus in a dilated lower pole calyx. Correlation suggested with urinalysis as chronic relative lower pole obstruction may predispose to chronic infection. 3. Stable peribronchial cystic changes at both lung bases, especially on the left. Electronically Signed   By: Irish Lack M.D.   On: 11/19/2022 08:26    Pending Labs Unresulted Labs (From admission, onward)     Start     Ordered   11/20/22 0500  Basic metabolic panel  Tomorrow morning,   R        11/19/22 1207   11/20/22 0500  CBC  Tomorrow morning,   R        11/19/22 1207            Vitals/Pain Today's Vitals   11/19/22 0930 11/19/22 1000 11/19/22 1020 11/19/22 1416  BP: (!) 152/98 Marland Kitchen)  176/89  (!) 178/98  Pulse:    (!) 54  Resp: (!) 22 18  18   Temp:   98.2 F (36.8 C) 98.2 F (36.8 C)  TempSrc:   Oral   SpO2:    98%  PainSc:         Isolation Precautions No active isolations  Medications Medications  amLODipine (NORVASC) tablet 5 mg (has no administration in time range)  pantoprazole (PROTONIX) injection 40 mg (has no administration in time range)  enoxaparin (LOVENOX) injection 40 mg (has no administration in time range)  0.9 %  sodium chloride infusion ( Intravenous New Bag/Given 11/19/22 1246)  morphine (PF) 2 MG/ML injection 2 mg (has no administration in time range)  traZODone (DESYREL) tablet 25 mg (has no administration in time range)  ondansetron (ZOFRAN) tablet 4 mg (has no administration in time range)    Or  ondansetron (ZOFRAN) injection 4 mg (has no administration in time range)  albuterol (PROVENTIL) (2.5 MG/3ML) 0.083% nebulizer solution 2.5 mg (has no administration in time range)  hydrALAZINE (APRESOLINE) injection 5 mg (has no administration in time range)  HYDROmorphone (DILAUDID) injection 1 mg (1 mg Intravenous Given 11/19/22 0635)  ondansetron (ZOFRAN) injection 4 mg (4 mg Intravenous Given 11/19/22 0633)  lactated ringers bolus 1,000 mL (0 mLs Intravenous Stopped 11/19/22 1153)  iohexol (OMNIPAQUE) 300 MG/ML solution 100 mL (100 mLs Intravenous Contrast Given 11/19/22 0741)  HYDROmorphone (DILAUDID) injection 1 mg (1 mg Intravenous Given 11/19/22 0920)  pantoprazole (PROTONIX) injection 40 mg (40 mg Intravenous Given 11/19/22 0920)    Mobility walks with person assist

## 2022-11-19 NOTE — ED Notes (Signed)
Prior to PO challenge, pt reports being on verge of vomiting. Withheld fluids and notified ED Dr.

## 2022-11-19 NOTE — H&P (Signed)
History and Physical  Daniel [name] UVO:[id number] DOB: July 04, 1965 DOA: 11/19/2022  PCP: [name], MD   Chief Complaint: Abdominal pain and vomiting  HPI: Daniel [name] is a 57 y.o. male with medical history significant for SPECT and chronic gastritis, anxiety/depression, homelessness, medication noncompliance being admitted to the hospital with intractable nausea.  States he was in his usual state of health until this morning at about 4 AM, when he woke up with severe epigastric burning abdominal pain, and vomiting.  States he has not been able to keep anything down all day.  After getting to the hospital, states he had a normal bowel movement.  Denies any hematemesis, or coffee-ground emesis.  Denies chest pain, no fevers or chills.  ED Course: Here the patient is afebrile, hypertensive.  He is not compliant with his home amlodipine.  Lab work only significant for minimal leukocytosis.  Urinalysis not impressive.  CT abdomen pelvis with known renal calculus, without obstruction.  Review of Systems: Please see HPI for pertinent positives and negatives. A complete 10 system review of systems are otherwise negative.  Past Medical History:  Diagnosis Date   Anxiety    Chronic lower back pain    Depression    Internal hemorrhoids with other complication    Nephrolithiasis    Rectal abscess    Past Surgical History:  Procedure Laterality Date   HERNIA REPAIR     INCISION AND DRAINAGE PERIRECTAL ABSCESS  10/2010    Social History:  reports that he has been smoking cigarettes. He has a 21 pack-year smoking history. He has quit using smokeless tobacco. He reports current alcohol use. He reports current drug use. Drug: Marijuana.   No Known Allergies  Family History  Problem Relation Age of Onset   HIV/AIDS Father    Stomach cancer Sister    Stomach cancer Brother      Prior to Admission medications   Medication Sig Start Date End Date Taking? Authorizing Provider  alum &  mag hydroxide-simeth (MAALOX/MYLANTA) 200-200-20 MG/5ML suspension Take 30 mLs by mouth every 4 (four) hours as needed for indigestion or heartburn. 03/16/22   Glade Lloyd, MD  amLODipine (NORVASC) 5 MG tablet Take 1 tablet (5 mg total) by mouth daily. Patient not taking: Reported on 03/14/2022 12/20/21   [name], MD  dicyclomine (BENTYL) 20 MG tablet Take 1 tablet (20 mg total) by mouth 4 (four) times daily as needed for spasms. Patient not taking: Reported on 03/14/2022 12/20/21   [name], MD  ondansetron (ZOFRAN) 4 MG tablet Take 1 tablet (4 mg total) by mouth 2 (two) times daily. Patient not taking: Reported on 03/14/2022 12/20/21   [name], MD  pantoprazole (PROTONIX) 40 MG tablet Take 1 tablet (40 mg total) by mouth 2 (two) times daily before a meal. 03/16/22 04/15/22  Glade Lloyd, MD  traMADol (ULTRAM) 50 MG tablet Take 1 tablet (50 mg total) by mouth every 6 (six) hours as needed for moderate pain. 03/16/22   Glade Lloyd, MD    Physical Exam: BP (!) 176/89   Pulse 78   Temp 98.2 F (36.8 C) (Oral)   Resp 18   SpO2 99%   General: Patient lying in the stretcher in the ER, alert, oriented x 3, pleasant and cooperative.  Observed to be resting quietly in bed but starts writhing in the bed when I introduced myself. Eyes: EOMI, clear conjuctivae, white sclerea Neck: supple, no masses, trachea mildline  Cardiovascular: RRR,  no murmurs or rubs, no peripheral edema  Respiratory: clear to auscultation bilaterally, no wheezes, no crackles  Abdomen: soft, nontender when distracted, nondistended, normal bowel tones heard  Skin: dry, no rashes  Musculoskeletal: no joint effusions, normal range of motion  Psychiatric: appropriate affect, normal speech  Neurologic: extraocular muscles intact, clear speech, moving all extremities with intact sensorium          Labs on Admission:  Basic Metabolic Panel: Recent Labs  Lab 11/19/22 0627  NA 140  K 3.9  CL 107   CO2 21*  GLUCOSE 106*  BUN 7  CREATININE 0.68  CALCIUM 9.0  MG 1.9   Liver Function Tests: Recent Labs  Lab 11/19/22 0627  AST 28  ALT 22  ALKPHOS 70  BILITOT 0.3  PROT 8.5*  ALBUMIN 4.1   Recent Labs  Lab 11/19/22 0627  LIPASE 33   No results for input(s): "AMMONIA" in the last 168 hours. CBC: Recent Labs  Lab 11/19/22 0627  WBC 11.4*  NEUTROABS 9.5*  HGB 15.0  HCT 46.4  MCV 96.3  PLT 252   Cardiac Enzymes: No results for input(s): "CKTOTAL", "CKMB", "CKMBINDEX", "TROPONINI" in the last 168 hours.  BNP (last 3 results) No results for input(s): "BNP" in the last 8760 hours.  ProBNP (last 3 results) No results for input(s): "PROBNP" in the last 8760 hours.  CBG: No results for input(s): "GLUCAP" in the last 168 hours.  Radiological Exams on Admission: CT ABDOMEN PELVIS W CONTRAST  Result Date: 11/19/2022 CLINICAL DATA:  Generalized abdominal pain, nausea, vomiting and diarrhea. EXAM: CT ABDOMEN AND PELVIS WITH CONTRAST TECHNIQUE: Multidetector CT imaging of the abdomen and pelvis was performed using the standard protocol following bolus administration of intravenous contrast. RADIATION DOSE REDUCTION: This exam was performed according to the departmental dose-optimization program which includes automated exposure control, adjustment of the mA and/or kV according to patient size and/or use of iterative reconstruction technique. CONTRAST:  OMNIPAQUE IOHEXOL 300 MG/ML  SOLN COMPARISON:  03/14/2022 FINDINGS: Lower chest: Stable peribronchial cystic changes at both lung bases, especially on the left. No pulmonary consolidation or pleural fluid at the lung bases. Hepatobiliary: Stable tiny scattered hepatic cysts. Normal appearance of the gallbladder. No significant biliary ductal dilatation. Pancreas: Unremarkable. No pancreatic ductal dilatation or surrounding inflammatory changes. Spleen: Normal in size without focal abnormality. Adrenals/Urinary Tract: Stable  staghorn calculus of the right kidney within a lower infundibulum measuring up to 1.8 cm in greatest diameter and causing some chronic partial obstruction of the lower pole collecting system. Additional 7 mm calculus in a dilated lower pole calyx. Correlation suggested with urinalysis as chronic relative lower pole obstruction may predispose to chronic infection. No evidence additional calculi or bladder abnormality. No adrenal masses identified. Stomach/Bowel: There is chronic edema the stomach with some wall thickening of the antrum. This may be slightly more prominent compared to the 2023 study and is consistent with gastritis. No overt ulceration or perforation identified. No evidence of gastric outlet obstruction. Bowel shows no evidence of obstruction, ileus or lesion. The appendix is normal. No free intraperitoneal air. Vascular/Lymphatic: No significant vascular findings are present. No enlarged abdominal or pelvic lymph nodes. Reproductive: Prostate is unremarkable. Other: No abdominal wall hernia or abnormality. No abdominopelvic ascites. Musculoskeletal: No acute or significant osseous findings. IMPRESSION: 1. Chronic edema of the stomach with some wall thickening of the antrum. This may be slightly more prominent compared to the 2023 study and is consistent with gastritis. No overt ulceration  or perforation identified. 2. 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 Additional 7 mm calculus in a dilated lower pole calyx. Correlation suggested with urinalysis as chronic relative lower pole obstruction may predispose to chronic infection. 3. Stable peribronchial cystic changes at both lung bases, especially on the left. Electronically Signed   By: Irish Lack M.D.   On: 11/19/2022 08:26    Assessment/Plan This is a pleasant 57 year old gentleman with a history of homelessness, hypertension, gastritis being admitted to the  hospital with abdominal discomfort, intractable nausea and vomiting.  Intractable nausea and vomiting-could be due to untreated gastritis. -Observation admission -IV PPI -IV fluids -Pain and nausea medication available as needed  Gastritis-IV PPI while in-house, encourage patient to pursue outpatient GI follow-up  Hypertension-start amlodipine, add as needed IV hydralazine  Leukocytosis-mild, likely reactive from recent vomiting.  No acute infection is suspected.  DVT prophylaxis: Lovenox     Code Status: Full Code  Consults called: None  Admission status: Observation  Time spent: 38 minutes  Takiera Mayo Sharlette Dense MD [facility] Pager [phone]  If 7PM-7AM, please contact night-coverage [email or web address] Password [facility]  11/19/2022, 12:07 PM

## 2022-11-19 NOTE — ED Provider Notes (Signed)
[place] EMERGENCY DEPARTMENT AT [facility] Provider Note   CSN: [id number] Arrival date & time: 11/19/22  0544     History  Chief Complaint  Patient presents with   Abdominal Pain   Nausea   Emesis   Diarrhea    Pt bib EMS with c/o generalized abd pain, nausea, vomiting and diarrhea ongoing for 2 hrs. Cramping pain, 10/10    Daniel [name] is a 57 y.o. male.   Abdominal Pain Associated symptoms: diarrhea, nausea and vomiting   Emesis Associated symptoms: abdominal pain and diarrhea   Diarrhea Associated symptoms: abdominal pain and vomiting   Patient present for abdominal pain.  Medical history includes anxiety, depression, nephrolithiasis.  He reports that he was in his normal state of health last night.  This morning, approximately 2 hours prior to arrival, he was awakened from sleep by a central abdominal pain.  Pain has been severe in nature.  He has had nausea, vomiting, and diarrhea since onset.  He states that he has had similar symptoms in the past but was unaware of what caused it.     Home Medications Prior to Admission medications   Medication Sig Start Date End Date Taking? Authorizing Provider  alum & mag hydroxide-simeth (MAALOX/MYLANTA) 200-200-20 MG/5ML suspension Take 30 mLs by mouth every 4 (four) hours as needed for indigestion or heartburn. 03/16/22   Glade Lloyd, MD  amLODipine (NORVASC) 5 MG tablet Take 1 tablet (5 mg total) by mouth daily. Patient not taking: Reported on 03/14/2022 12/20/21   [name], MD  dicyclomine (BENTYL) 20 MG tablet Take 1 tablet (20 mg total) by mouth 4 (four) times daily as needed for spasms. Patient not taking: Reported on 03/14/2022 12/20/21   [name], MD  ondansetron (ZOFRAN) 4 MG tablet Take 1 tablet (4 mg total) by mouth 2 (two) times daily. Patient not taking: Reported on 03/14/2022 12/20/21   [name], MD  pantoprazole (PROTONIX) 40 MG tablet Take 1 tablet (40 mg total) by mouth 2  (two) times daily before a meal. 03/16/22 04/15/22  Glade Lloyd, MD  traMADol (ULTRAM) 50 MG tablet Take 1 tablet (50 mg total) by mouth every 6 (six) hours as needed for moderate pain. 03/16/22   Glade Lloyd, MD      Allergies    Patient has no known allergies.    Review of Systems   Review of Systems  Gastrointestinal:  Positive for abdominal pain, diarrhea, nausea and vomiting.  All other systems reviewed and are negative.   Physical Exam Updated Vital Signs BP (!) 169/102 (BP Location: Right Arm)   Pulse 78   Temp 98.2 F (36.8 C) (Oral)   Resp 20   SpO2 99%  Physical Exam Vitals and nursing note reviewed.  Constitutional:      General: He is not in acute distress.    Appearance: He is well-developed. He is not toxic-appearing or diaphoretic.  HENT:     Head: Normocephalic and atraumatic.     Mouth/Throat:     Mouth: Mucous membranes are moist.  Eyes:     Conjunctiva/sclera: Conjunctivae normal.  Cardiovascular:     Rate and Rhythm: Regular rhythm. Tachycardia present.     Heart sounds: No murmur heard. Pulmonary:     Effort: Pulmonary effort is normal. No respiratory distress.  Abdominal:     Palpations: Abdomen is soft.     Tenderness: There is generalized abdominal tenderness. There is no guarding or rebound.  Musculoskeletal:        General: No swelling.     Cervical back: Neck supple.  Skin:    General: Skin is warm and dry.  Neurological:     General: No focal deficit present.     Mental Status: He is alert and oriented to person, place, and time.  Psychiatric:        Mood and Affect: Mood normal.        Behavior: Behavior normal.     ED Results / Procedures / Treatments   Labs (all labs ordered are listed, but only abnormal results are displayed) Labs Reviewed  COMPREHENSIVE METABOLIC PANEL - Abnormal; Notable for the following components:      Result Value   CO2 21 (*)    Glucose, Bld 106 (*)    Total Protein 8.5 (*)    All other  components within normal limits  CBC WITH DIFFERENTIAL/PLATELET - Abnormal; Notable for the following components:   WBC 11.4 (*)    Neutro Abs 9.5 (*)    All other components within normal limits  LIPASE, BLOOD  MAGNESIUM  URINALYSIS, ROUTINE W REFLEX MICROSCOPIC  I-STAT CHEM 8, ED  I-STAT CG4 LACTIC ACID, ED  TROPONIN I (HIGH SENSITIVITY)    EKG None  Radiology No results found.  Procedures Procedures    Medications Ordered in ED Medications  HYDROmorphone (DILAUDID) injection 1 mg (1 mg Intravenous Given 11/19/22 0635)  ondansetron (ZOFRAN) injection 4 mg (4 mg Intravenous Given 11/19/22 0633)  lactated ringers bolus 1,000 mL (1,000 mLs Intravenous New Bag/Given 11/19/22 0865)    ED Course/ Medical Decision Making/ A&P                                 Medical Decision Making Amount and/or Complexity of Data Reviewed Labs: ordered.  Risk Prescription drug management.   This patient presents to the ED for concern of abdominal pain, this involves an extensive number of treatment options, and is a complaint that carries with it a high risk of complications and morbidity.  The differential diagnosis includes gastritis, PUD, enteritis, colitis, other intra-abdominal infection, pancreatitis   Co morbidities that complicate the patient evaluation  anxiety, depression, nephrolithiasis   Additional history obtained:  Additional history obtained from N/A External records from outside source obtained and reviewed including EMR   Lab Tests:  I Ordered, and personally interpreted labs.  The pertinent results include: Mild leukocytosis is present.  Remaining lab work is pending at time of signout.   Imaging Studies ordered:  I ordered imaging studies including CT of abdomen pelvis I independently visualized and interpreted imaging which showed (pending at time of signout) I agree with the radiologist interpretation   Cardiac Monitoring: / EKG:  The patient was  maintained on a cardiac monitor.  I personally viewed and interpreted the cardiac monitored which showed an underlying rhythm of: Sinus rhythm   Problem List / ED Course / Critical interventions / Medication management  Patient presents for acute onset of abdominal pain, starting 2 hours prior to arrival.  Additional symptoms include nausea, vomiting, and diarrhea.  On arrival in the ED, patient is writhing in the bed.  He describes a central abdominal pain.  Tenderness is present.  Dilaudid and Zofran ordered for symptomatic relief.  Given his fluid losses, IV fluids ordered for hydration.  Laboratory workup was initiated.  On reassessment, patient now appears more comfortable.  Abdominal  pain has improved.  CBC shows mild leukocytosis.  Remaining lab work and imaging studies are pending at time of signout.  Care of patient was signed out to oncoming ED provider. I ordered medication including IV fluids for hydration; Dilaudid and Zofran for symptomatic relief Reevaluation of the patient after these medicines showed that the patient improved I have reviewed the patients home medicines and have made adjustments as needed   Social Determinants of Health:  Has PCP        Final Clinical Impression(s) / ED Diagnoses Final diagnoses:  Periumbilical abdominal pain    Rx / DC Orders ED Discharge Orders     None         Gloris Manchester, MD 11/19/22 [phone]

## 2022-11-19 NOTE — ED Provider Notes (Signed)
Signout from Dr. Durwin Nora.  57 year old male here with acute onset of epigastric abdominal pain nausea vomiting diarrhea.  He is pending labs and CT imaging.  Disposition per results of testing. Physical Exam  BP (!) 169/102 (BP Location: Right Arm)   Pulse 78   Temp 98.2 F (36.8 C) (Oral)   Resp 20   SpO2 99%   Physical Exam  Procedures  Procedures  ED Course / MDM    Medical Decision Making Amount and/or Complexity of Data Reviewed Labs: ordered. Radiology: ordered.  Risk Prescription drug management.   9am Patient's lab work showing elevated white count some blood in the urine.  Otherwise fairly unremarkable.  He still is having significant amount of pain.  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  He said he is not taking his PPI anymore.  Will redose pain medicine and give him IV Protonix.  11 AM.  Patient still feeling a little nauseous.  Does not really want to eat or drink anything.  He said he is very nervous about finding out about this kidney stone.   12 PM.  Still unable to tolerate any p.o.  Have talked with the hospitalist Dr. Erenest Blank who will evaluate for admission.    Terrilee Files, MD 11/19/22 [phone]

## 2022-11-20 DIAGNOSIS — R112 Nausea with vomiting, unspecified: Secondary | ICD-10-CM | POA: Diagnosis not present

## 2022-11-20 MED ORDER — PANTOPRAZOLE SODIUM 40 MG PO TBEC
40.0000 mg | DELAYED_RELEASE_TABLET | Freq: Every day | ORAL | 2 refills | Status: AC
Start: 2022-11-20 — End: 2023-11-20

## 2022-11-20 MED ORDER — POTASSIUM CHLORIDE CRYS ER 20 MEQ PO TBCR
40.0000 meq | EXTENDED_RELEASE_TABLET | Freq: Once | ORAL | Status: AC
  Administered 2022-11-20: 40 meq via ORAL
  Filled 2022-11-20: qty 2

## 2022-11-20 MED ORDER — AMLODIPINE BESYLATE 5 MG PO TABS: 5.0000 mg | ORAL_TABLET | Freq: Every day | ORAL | 2 refills | Status: DC

## 2022-11-20 NOTE — Discharge Summary (Signed)
Physician Discharge Summary   Daniel [name] XNA:[id number] DOB: 1966/01/08 DOA: 11/19/2022  PCP: [name], MD  Admit date: 11/19/2022 Discharge date: 11/20/2022   Admitted From: Home Disposition: Home Discharging physician: Lewie Chamber, MD Barriers to discharge: None  Recommendations at discharge: Missed referral to GI previously.  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 Yes; Fluid consistency: Thin  Diet effective now       Question Answer Comment  Room service appropriate? Yes   Fluid consistency: Thin      11/19/22 1207            Hospital Course: Mr. Daniel [name] is a 57 year old male with PMH chronic gastritis, anxiety/depression who presented with nausea/vomiting.  He also had some associated epigastric abdominal pain/burning.  CT abdomen/pelvis showed chronic edema in the stomach with some wall thickening of the antrum consistent with probable gastritis.  He has missed following up with outpatient GI in the past and he was recommended to pursue this once again.  He was started on Protonix at discharge and recommended to cut back on his smoking and alcohol use.  CT also noted a stable staghorn calculus involving right kidney with some chronic partial obstruction of the lower pole collecting system.  He had no renal dysfunction on lab work and no signs of infection on urinalysis.  He was recommended to pursue outpatient referral with urology for any further definitive management.  Symptoms improved with supportive care overnight and he tolerated diet well prior to discharge.   The patient's chronic medical conditions were treated accordingly per the patient's home  medication regimen except as noted.  On day of discharge, patient was felt deemed stable for discharge. Patient/family member advised to call PCP or come back to ER if needed.   Principal Diagnosis: Intractable vomiting with nausea  Discharge Diagnoses: Active Hospital Problems   Diagnosis Date Noted   Intractable vomiting with nausea 11/19/2022    Resolved Hospital Problems  No resolved problems to display.     Discharge Instructions     Diet general   Complete by: As directed    Increase activity slowly   Complete by: As directed       Allergies as of 11/20/2022   No Known Allergies      Medication List     STOP taking these medications    dicyclomine 20 MG tablet Commonly known as: BENTYL   ondansetron 4 MG tablet Commonly known as: ZOFRAN       TAKE these medications    amLODipine 5 MG tablet Commonly known as: NORVASC Take 1 tablet (5 mg total) by mouth daily.   pantoprazole 40 MG tablet Commonly known as: Protonix Take 1 tablet (40 mg total) by mouth daily.        Follow-up Information     [name], MD. Schedule an appointment as soon as possible for a visit .   Specialty: Pulmonary Disease Why: For recheck of your symptoms Contact information: [address] [place] [address] [phone]         [facility]. Schedule an appointment as soon as possible for a  visit .   Why: For recheck of your symptoms Contact information: [address] [place] [address] [phone]               No Known Allergies  Consultations:   Procedures:   Discharge Exam: BP 116/88 (BP Location: Left Arm)   Pulse 77   Temp 99.1 F (37.3 C)   Resp 16   SpO2 99%  Physical Exam Constitutional:      General: He is not in acute distress.    Appearance: Normal appearance.  HENT:     Head: Normocephalic and atraumatic.     Mouth/Throat:     Mouth: Mucous membranes are moist.   Eyes:     Extraocular Movements: Extraocular movements intact.  Cardiovascular:     Rate and Rhythm: Normal rate and regular rhythm.  Pulmonary:     Effort: Pulmonary effort is normal. No respiratory distress.     Breath sounds: Normal breath sounds. No wheezing.  Abdominal:     General: Bowel sounds are normal. There is no distension.     Palpations: Abdomen is soft.     Tenderness: There is no abdominal tenderness.  Musculoskeletal:        General: Normal range of motion.     Cervical back: Normal range of motion and neck supple.  Skin:    General: Skin is warm and dry.  Neurological:     General: No focal deficit present.     Mental Status: He is alert.  Psychiatric:        Mood and Affect: Mood normal.        Behavior: Behavior normal.      The results of significant diagnostics from this hospitalization (including imaging, microbiology, ancillary and laboratory) are listed below for reference.   Microbiology: No results found for this or any previous visit (from the past 240 hour(s)).   Labs: BNP (last 3 results) No results for input(s): "BNP" in the last 8760 hours. Basic Metabolic Panel: Recent Labs  Lab 11/19/22 0627 11/20/22 0530  NA 140 136  K 3.9 3.3*  CL 107 103  CO2 21* 24  GLUCOSE 106* 94  BUN 7 7  CREATININE 0.68 0.75  CALCIUM 9.0 8.8*  MG 1.9  --    Liver Function Tests: Recent Labs  Lab 11/19/22 0627  AST 28  ALT 22  ALKPHOS 70  BILITOT 0.3  PROT 8.5*  ALBUMIN 4.1   Recent Labs  Lab 11/19/22 0627  LIPASE 33   No results for input(s): "AMMONIA" in the last 168 hours. CBC: Recent Labs  Lab 11/19/22 0627 11/20/22 0530  WBC 11.4* 8.9  NEUTROABS 9.5*  --   HGB 15.0 13.9  HCT 46.4 43.2  MCV 96.3 97.1  PLT 252 233   Cardiac Enzymes: No results for input(s): "CKTOTAL", "CKMB", "CKMBINDEX", "TROPONINI" in the last 168 hours. BNP: Invalid input(s): "POCBNP" CBG: No results for input(s): "GLUCAP" in the last 168  hours. D-Dimer No results for input(s): "DDIMER" in the last 72 hours. Hgb A1c No results for input(s): "HGBA1C" in the last 72 hours. Lipid Profile No results for input(s): "CHOL", "HDL", "LDLCALC", "TRIG", "CHOLHDL", "LDLDIRECT" in the last 72 hours. Thyroid function studies No results for input(s): "TSH", "T4TOTAL", "T3FREE", "THYROIDAB" in the last 72 hours.  Invalid input(s): "FREET3" Anemia work up No results for input(s): "VITAMINB12", "FOLATE", "FERRITIN", "TIBC", "IRON", "RETICCTPCT" in the last 72 hours. Urinalysis    Component Value Date/Time  COLORURINE YELLOW 11/19/2022 0813   APPEARANCEUR CLOUDY (A) 11/19/2022 0813   LABSPEC 1.017 11/19/2022 0813   PHURINE 8.0 11/19/2022 0813   GLUCOSEU NEGATIVE 11/19/2022 0813   HGBUR SMALL (A) 11/19/2022 0813   BILIRUBINUR NEGATIVE 11/19/2022 0813   KETONESUR NEGATIVE 11/19/2022 0813   PROTEINUR NEGATIVE 11/19/2022 0813   UROBILINOGEN 0.2 05/17/2014 1348   NITRITE NEGATIVE 11/19/2022 0813   LEUKOCYTESUR NEGATIVE 11/19/2022 0813   Sepsis Labs Recent Labs  Lab 11/19/22 0627 11/20/22 0530  WBC 11.4* 8.9   Microbiology No results found for this or any previous visit (from the past 240 hour(s)).  Procedures/Studies: CT ABDOMEN PELVIS W CONTRAST  Result Date: 11/19/2022 CLINICAL DATA:  Generalized abdominal pain, nausea, vomiting and diarrhea. EXAM: CT ABDOMEN AND PELVIS WITH CONTRAST TECHNIQUE: Multidetector CT imaging of the abdomen and pelvis was performed using the standard protocol following bolus administration of intravenous contrast. RADIATION DOSE REDUCTION: This exam was performed according to the departmental dose-optimization program which includes automated exposure control, adjustment of the mA and/or kV according to patient size and/or use of iterative reconstruction technique. CONTRAST:  OMNIPAQUE IOHEXOL 300 MG/ML  SOLN COMPARISON:  03/14/2022 FINDINGS: Lower chest: Stable peribronchial cystic changes at both  lung bases, especially on the left. No pulmonary consolidation or pleural fluid at the lung bases. Hepatobiliary: Stable tiny scattered hepatic cysts. Normal appearance of the gallbladder. No significant biliary ductal dilatation. Pancreas: Unremarkable. No pancreatic ductal dilatation or surrounding inflammatory changes. Spleen: Normal in size without focal abnormality. 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 Additional 7 mm calculus in a dilated lower pole calyx. Correlation suggested with urinalysis as chronic relative lower pole obstruction may predispose to chronic infection. No evidence additional calculi or bladder abnormality. No adrenal masses identified. Stomach/Bowel: There is chronic edema the stomach with some wall thickening of the antrum. This may be slightly more prominent compared to the 2023 study and is consistent with gastritis. No overt ulceration or perforation identified. No evidence of gastric outlet obstruction. Bowel shows no evidence of obstruction, ileus or lesion. The appendix is normal. No free intraperitoneal air. Vascular/Lymphatic: No significant vascular findings are present. No enlarged abdominal or pelvic lymph nodes. Reproductive: Prostate is unremarkable. Other: No abdominal wall hernia or abnormality. No abdominopelvic ascites. Musculoskeletal: No acute or significant osseous findings. IMPRESSION: 1. Chronic edema of the stomach with some wall thickening of the antrum. This may be slightly more prominent compared to the 2023 study and is consistent with gastritis. No overt ulceration or perforation identified. 2. 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 Additional 7 mm calculus in a dilated lower pole calyx.  Correlation suggested with urinalysis as chronic relative lower pole obstruction may predispose to chronic infection. 3. Stable peribronchial cystic changes at both lung bases, especially on the left. Electronically Signed   By: Irish Lack M.D.   On: 11/19/2022 08:26     Time coordinating discharge: Over 30 minutes    Lewie Chamber, MD  [facility] 11/20/2022, 12:00 PM

## 2022-11-20 NOTE — Hospital Course (Signed)
Daniel [name] is a 57 year old male with PMH chronic gastritis, anxiety/depression who presented with nausea/vomiting.  He also had some associated epigastric abdominal pain/burning.  CT abdomen/pelvis showed chronic edema in the stomach with some wall thickening of the antrum consistent with probable gastritis.  He has missed following up with outpatient GI in the past and he was recommended to pursue this once again.  He was started on Protonix at discharge and recommended to cut back on his smoking and alcohol use.  CT also noted a stable staghorn calculus involving right kidney with some chronic partial obstruction of the lower pole collecting system.  He had no renal dysfunction on lab work and no signs of infection on urinalysis.  He was recommended to pursue outpatient referral with urology for any further definitive management.  Symptoms improved with supportive care overnight and he tolerated diet well prior to discharge.

## 2022-11-20 NOTE — TOC Transition Note (Signed)
Transition of Care [facility]) - CM/SW Discharge Note   Patient Details  Name: Daniel [name] MRN: [id number] Date of Birth: [birth year]  [facility] CM/SW Contact:  Otelia Santee, LCSW Phone Number: 11/20/2022, 10:41 AM   Clinical Narrative:       Final next level of care: Homeless Shelter Barriers to Discharge: No Barriers Identified   Patient Goals and CMS Choice CMS Medicare.gov Compare Post Acute Care list provided to:: Patient Choice offered to / list presented to : Patient  Discharge Placement  Met with pt to address SDOH concerns. Pt shares he is currently homeless and has been sleeping on the streets. He is aware of the [facility] however, typically stays away due to too many "people and things happening."  Pt is agreeable to having resources for housing and financial resources added to AVS. Resources have been added to discharge paperwork.                        Discharge Plan and Services Additional resources added to the After Visit Summary for                  DME Arranged: N/A DME Agency: NA                  Social Determinants of Health (SDOH) Interventions SDOH Screenings   Food Insecurity: No Food Insecurity (11/19/2022)  Housing: High Risk (11/19/2022)  Transportation Needs: No Transportation Needs (11/19/2022)  Utilities: At Risk (11/19/2022)  Depression (PHQ2-9): Low Risk  (12/20/2021)  Tobacco Use: High Risk (11/19/2022)     Readmission Risk Interventions     No data to display

## 2022-11-21 ENCOUNTER — Telehealth: Payer: Self-pay

## 2022-11-21 NOTE — Transitions of Care (Post Inpatient/ED Visit) (Signed)
   11/21/2022  Name: Kohei Boortz Mainor MRN: [id number] DOB: Oct 11, 1965  Today's TOC FU Call Status: Today's TOC FU Call Status:: Unsuccessful Call (1st Attempt) Unsuccessful Call (1st Attempt) Date: 11/21/22  Attempted to reach the patient regarding the most recent Inpatient/ED visit.  Follow Up Plan: Additional outreach attempts will be made to reach the patient to complete the Transitions of Care (Post Inpatient/ED visit) call.   Signature  Robyne Peers, RN

## 2022-11-22 ENCOUNTER — Telehealth: Payer: Self-pay

## 2022-11-22 NOTE — Transitions of Care (Post Inpatient/ED Visit) (Signed)
   11/22/2022  Name: Daniel [name] MRN: [id number] DOB: 09-03-1965  Today's TOC FU Call Status: Today's TOC FU Call Status:: Unsuccessful Call (2nd Attempt) Unsuccessful Call (1st Attempt) Date: 11/21/22 Unsuccessful Call (2nd Attempt) Date: 11/22/22  Attempted to reach the patient regarding the most recent Inpatient/ED visit.  Follow Up Plan: Additional outreach attempts will be made to reach the patient to complete the Transitions of Care (Post Inpatient/ED visit) call.   Signature   Robyne Peers, RN

## 2022-11-26 ENCOUNTER — Telehealth: Payer: Self-pay

## 2022-11-26 NOTE — Transitions of Care (Post Inpatient/ED Visit) (Signed)
   11/26/2022  Name: Daniel [name] MRN: [id number] DOB: 04-02-1966  Today's TOC FU Call Status: Today's TOC FU Call Status:: Unsuccessful Call (3rd Attempt) Unsuccessful Call (1st Attempt) Date: 11/21/22 Unsuccessful Call (2nd Attempt) Date: 11/22/22 Unsuccessful Call (3rd Attempt) Date: 11/26/22  Attempted to reach the patient regarding the most recent Inpatient/ED visit.  Follow Up Plan: No further outreach attempts will be made at this time. We have been unable to contact the patient.   The contact information for [facility] is on patient's AVS.  Letter also sent to address on file requesting the patient call the clinic to schedule an appointment as we have not been able to reach him.   Signature Robyne Peers, RN

## 2023-01-15 DIAGNOSIS — Z419 Encounter for procedure for purposes other than remedying health state, unspecified: Secondary | ICD-10-CM | POA: Diagnosis not present

## 2023-01-17 ENCOUNTER — Ambulatory Visit: Payer: 59 | Attending: Physician Assistant | Admitting: Physician Assistant

## 2023-01-17 ENCOUNTER — Other Ambulatory Visit: Payer: Self-pay

## 2023-01-17 VITALS — BP 123/83 | HR 71 | Temp 98.0°F | Resp 18 | Ht 68.0 in | Wt 129.2 lb

## 2023-01-17 DIAGNOSIS — Z09 Encounter for follow-up examination after completed treatment for conditions other than malignant neoplasm: Secondary | ICD-10-CM

## 2023-01-17 DIAGNOSIS — K2930 Chronic superficial gastritis without bleeding: Secondary | ICD-10-CM | POA: Diagnosis not present

## 2023-01-17 DIAGNOSIS — Z91199 Patient's noncompliance with other medical treatment and regimen due to unspecified reason: Secondary | ICD-10-CM | POA: Diagnosis not present

## 2023-01-17 DIAGNOSIS — N200 Calculus of kidney: Secondary | ICD-10-CM

## 2023-01-17 DIAGNOSIS — I10 Essential (primary) hypertension: Secondary | ICD-10-CM

## 2023-01-17 MED ORDER — AMLODIPINE BESYLATE 5 MG PO TABS
5.0000 mg | ORAL_TABLET | Freq: Every day | ORAL | 2 refills | Status: DC
  Filled 2023-01-17: qty 30, 30d supply, fill #0

## 2023-01-17 MED ORDER — PANTOPRAZOLE SODIUM 40 MG PO TBEC
40.0000 mg | DELAYED_RELEASE_TABLET | Freq: Every day | ORAL | 2 refills | Status: DC
Start: 2023-01-17 — End: 2023-01-17

## 2023-01-17 MED ORDER — TAMSULOSIN HCL 0.4 MG PO CAPS
0.4000 mg | ORAL_CAPSULE | Freq: Every day | ORAL | 3 refills | Status: DC
Start: 2023-01-17 — End: 2023-01-17

## 2023-01-17 MED ORDER — ACETAMINOPHEN-CODEINE 300-30 MG PO TABS
1.0000 | ORAL_TABLET | Freq: Three times a day (TID) | ORAL | 0 refills | Status: DC | PRN
Start: 2023-01-17 — End: 2023-01-17

## 2023-01-17 MED ORDER — TAMSULOSIN HCL 0.4 MG PO CAPS
0.4000 mg | ORAL_CAPSULE | Freq: Every day | ORAL | 3 refills | Status: DC
  Filled 2023-01-17: qty 30, 30d supply, fill #0

## 2023-01-17 MED ORDER — ACETAMINOPHEN-CODEINE 300-30 MG PO TABS
1.0000 | ORAL_TABLET | Freq: Three times a day (TID) | ORAL | 0 refills | Status: DC | PRN
  Filled 2023-01-17: qty 15, 5d supply, fill #0

## 2023-01-17 MED ORDER — PANTOPRAZOLE SODIUM 40 MG PO TBEC
40.0000 mg | DELAYED_RELEASE_TABLET | Freq: Every day | ORAL | 2 refills | Status: DC
Start: 2023-01-17 — End: 2023-03-18
  Filled 2023-01-17: qty 30, 30d supply, fill #0

## 2023-01-17 MED ORDER — AMLODIPINE BESYLATE 5 MG PO TABS
5.0000 mg | ORAL_TABLET | Freq: Every day | ORAL | 2 refills | Status: DC
Start: 2023-01-17 — End: 2023-01-17

## 2023-01-17 NOTE — Patient Instructions (Signed)
 Kidney Stones Kidney stones are rock-like masses that form inside of the kidneys. Kidneys are organs that make pee (urine). A kidney stone may move into other parts of the urinary tract, including: The tubes that connect the kidneys to the bladder (ureters). The bladder. The tube that carries urine out of the body (urethra). Kidney stones can cause very bad pain and can block the flow of pee. The stone usually leaves your body through your pee. A doctor may need to take out the stone. What are the causes? Kidney stones may be caused by: Too much calcium in the body. This may be caused by too much parathyroid hormone in the blood. Uric acid crystals in the bladder. The body makes uric acid when you eat certain foods. Narrowing of one or both of the ureters. A kidney blockage that you were born with. Past surgery on the kidney or the ureters. What increases the risk? You are more likely to develop this condition if: You have had a kidney stone in the past. Other people in your family have had kidney stones. You do not drink enough water. You eat a diet that is high in protein, salt (sodium), or sugar. You are very overweight (obese). What are the signs or symptoms? Symptoms of a kidney stone may include: Pain in the side of the belly, right below the ribs. Pain usually spreads to the groin. Needing to pee often or right away. Pain when peeing. Blood in your pee. Feeling like you may vomit (nauseous). Vomiting. Fever and chills. How is this treated? Treatment depends on the size, location, and makeup of the kidney stones. The stones will often pass out of the body when you pee. You may need to: Drink more fluid to help pass the stone. In some cases, you may be given fluids through an IV tube at the hospital. Take medicine for pain. Change your diet to help keep kidney stones from coming back. Sometimes, you may need: A procedure to break up kidney stones using a beam of light (laser)  or shock waves. Surgery to remove the kidney stones. Follow these instructions at home: Medicines Take over-the-counter and prescription medicines only as told by your doctor. Ask your doctor if the medicine prescribed to you requires you to avoid driving or using machinery. Eating and drinking Drink enough fluid to keep your pee pale yellow. You may be told to drink at least 8-10 glasses of water each day. This will help you pass the stone. If told by your doctor, change your diet. You may be told to: Limit how much salt you eat. Eat more fruits and vegetables. Limit how much meat, poultry, fish, and eggs you eat. Follow instructions from your doctor about what you may eat and drink. General instructions Collect pee samples as told by your doctor. You may need to collect a pee sample: 24 hours after a stone comes out. 8-12 weeks after a stone comes out, and every 6-12 months after that. Strain your pee every time you pee. Use the strainer that your doctor recommends. Do not throw out the stone. Keep it so that it can be tested by your doctor. Keep all follow-up visits. You may need X-rays and ultrasounds to make sure the stone has come out. How is this prevented? To prevent another kidney stone: Drink enough fluid to keep your pee pale yellow. This is the best way to prevent kidney stones. Eat healthy foods. Avoid certain foods as told by your doctor. You  may be told to eat less protein. Stay at a healthy weight. Where to find more information National Kidney Foundation (NKF): kidney.org [facility]): urologyhealth.org Contact a doctor if: You have pain that gets worse or does not get better with medicine. Get help right away if: You have a fever or chills. You get very bad pain. You get new pain in your belly. You faint. You cannot pee. This information is not intended to replace advice given to you by your health care provider. Make sure you discuss any  questions you have with your health care provider. Document Revised: 11/24/2021 Document Reviewed: 11/24/2021 Elsevier Patient Education  2024 [employer].

## 2023-01-17 NOTE — Progress Notes (Signed)
Patient ID: Daniel [name], male   DOB: 1965/06/13, 57 y.o.   MRN: [id number]   Daniel [name], is a 57 y.o. male  VQM:[id number]  [id number]  DOB - 14-Jun-1965  Chief Complaint  Patient presents with   Abdominal Pain    Hurting in the middle of his stomach       Subjective:   Daniel [name] is a 57 y.o. male here today for a follow up visit after being hospitalized from 8/5 to 8/6 with nephrolithiasis.  He is still having pain.  He has not had f/up or seen urology.  He is not taking amlodipine or pantoprazole.  Still having occasional moderate to severe flank pain on the Right.  Some nausea.  No recent vomiting.  No diarrhea or blood in stool.  Requesting something for pain.    From discharge summary: Hospital Course: Mr. Ebel is a 57 year old male with PMH chronic gastritis, anxiety/depression who presented with nausea/vomiting.  He also had some associated epigastric abdominal pain/burning.  CT abdomen/pelvis showed chronic edema in the stomach with some wall thickening of the antrum consistent with probable gastritis.  He has missed following up with outpatient GI in the past and he was recommended to pursue this once again.  He was started on Protonix at discharge and recommended to cut back on his smoking and alcohol use.   CT also noted a stable staghorn calculus involving right kidney with some chronic partial obstruction of the lower pole collecting system.  He had no renal dysfunction on lab work and no signs of infection on urinalysis.  He was recommended to pursue outpatient referral with urology for any further definitive management.   Symptoms improved with supportive care overnight and he tolerated diet well prior to discharge.     The patient's chronic medical conditions were treated accordingly per the patient's home medication regimen except as noted.  On day of discharge, patient was felt deemed stable for discharge. Patient/family member advised to call PCP or come back  to ER if needed.   CT report from 8/05: IMPRESSION: 1. Chronic edema of the stomach with some wall thickening of the antrum. This may be slightly more prominent compared to the 2023 study and is consistent with gastritis. No overt ulceration or perforation identified. 2. 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 Additional 7 mm calculus in a dilated lower pole calyx. Correlation suggested with urinalysis as chronic relative lower pole obstruction may predispose to chronic infection. 3. Stable peribronchial cystic changes at both lung bases, especially on the left.  No problems updated.  ALLERGIES: No Known Allergies  PAST MEDICAL HISTORY: Past Medical History:  Diagnosis Date   Anxiety    Chronic lower back pain    Depression    Internal hemorrhoids with other complication    Nephrolithiasis    Rectal abscess     MEDICATIONS AT HOME: Prior to Admission medications   Medication Sig Start Date End Date Taking? Authorizing Provider  acetaminophen-codeine (TYLENOL #3) 300-30 MG tablet Take 1 tablet by mouth every 8 (eight) hours as needed for moderate pain. Use sparingly 01/17/23  Yes Anders Simmonds, PA-C  tamsulosin (FLOMAX) 0.4 MG CAPS capsule Take 1 capsule (0.4 mg total) by mouth daily. 01/17/23  Yes Georgian Co M, PA-C  amLODipine (NORVASC) 5 MG tablet Take 1 tablet (5 mg total) by mouth daily. 01/17/23   Anders Simmonds, PA-C  pantoprazole (PROTONIX) 40 MG  tablet Take 1 tablet (40 mg total) by mouth daily. 01/17/23 01/17/24  Anders Simmonds, PA-C    ROS: Neg HEENT Neg resp Neg cardiac Neg GI Neg GU Neg MS Neg psych Neg neuro  Objective:   Vitals:   01/17/23 1020  BP: 123/83  Pulse: 71  Resp: 18  Temp: 98 F (36.7 C)  SpO2: 98%  Weight: 129 lb 3.2 oz (58.6 kg)  Height: 5\' 8"  (1.727 m)   Exam General appearance : Awake, alert, not in any distress. Speech Clear. Not  toxic looking HEENT: Atraumatic and Normocephalic Neck: Supple, no JVD. No cervical lymphadenopathy.  Chest: Good air entry bilaterally, CTAB.  No rales/rhonchi/wheezing CVS: S1 S2 regular, no murmurs.  Extremities: B/L Lower Ext shows no edema, both legs are warm to touch Neurology: Awake alert, and oriented X 3, CN II-XII intact, Non focal Skin: No Rash  Data Review Lab Results  Component Value Date   HGBA1C 5.2 12/20/2021    Assessment & Plan   1. Nephrolithiasis - acetaminophen-codeine (TYLENOL #3) 300-30 MG tablet; Take 1 tablet by mouth every 8 (eight) hours as needed for moderate pain. Use sparingly  Dispense: 15 tablet; Refill: 0 - tamsulosin (FLOMAX) 0.4 MG CAPS capsule; Take 1 capsule (0.4 mg total) by mouth daily.  Dispense: 30 capsule; Refill: 3 - Ambulatory referral to Urology  2. Chronic superficial gastritis without bleeding - pantoprazole (PROTONIX) 40 MG tablet; Take 1 tablet (40 mg total) by mouth daily.  Dispense: 30 tablet; Refill: 2  3. Noncompliance Encouraged to pick up all meds and drink 80-100 ounces water daily  4. Hospital discharge follow-up  5. Primary hypertension Uncontrolled and not taking meds-resume meds - amLODipine (NORVASC) 5 MG tablet; Take 1 tablet (5 mg total) by mouth daily.  Dispense: 30 tablet; Refill: 2    Return in about 3 months (around 04/19/2023) for PCP for chronic conditions-needs to be assigned to new PCP .  The patient was given clear instructions to go to ER or return to medical center if symptoms don't improve, worsen or new problems develop. The patient verbalized understanding. The patient was told to call to get lab results if they haven't heard anything in the next week.      Georgian Co, PA-C [facility] [place], [place] [phone]   01/17/2023, 10:56 AM

## 2023-01-24 ENCOUNTER — Telehealth: Payer: Self-pay

## 2023-01-24 ENCOUNTER — Other Ambulatory Visit: Payer: Self-pay

## 2023-01-24 NOTE — Telephone Encounter (Signed)
Pharmacy Patient Advocate Encounter  Received notification from [employer] that Prior Authorization for ACETAMINOPHEN-CODEINE has been APPROVED from 01/24/2023 to 07/23/2023   PA #/Case ID/Reference #: [id number]

## 2023-02-07 ENCOUNTER — Ambulatory Visit: Payer: 59 | Admitting: Urology

## 2023-02-07 NOTE — Progress Notes (Deleted)
Assessment: 1. Nephrolithiasis    Plan: I personally reviewed the patient's chart including provider notes, lab and imaging results. I personally reviewed the CT study from 11/19/2022 in addition to prior CTs from 2023 and 2020.  Results as noted below.  Chief Complaint: No chief complaint on file.   History of Present Illness:  Daniel [name] is a 57 y.o. male who is seen in consultation from Georgian Co, PA-C for evaluation of right nephrolithiasis. He has a history of right nephrolithiasis dating back to at least 2020.  He has had multiple CT studies showing a right lower pole calculus without significant obstruction.  He was recently evaluated with CT imaging on 11/19/2022 for generalized abdominal pain, nausea, and vomiting.  CT abdomen and pelvis with contrast showed a stable staghorn calculus in the right kidney within a lower calyx measuring up to 1.8 cm in diameter with some possible partial obstruction of the lower pole collecting system and an additional 7 mm calculus in a lower pole calyx.    Past Medical History:  Past Medical History:  Diagnosis Date   Anxiety    Chronic lower back pain    Depression    Internal hemorrhoids with other complication    Nephrolithiasis    Rectal abscess     Past Surgical History:  Past Surgical History:  Procedure Laterality Date   HERNIA REPAIR     INCISION AND DRAINAGE PERIRECTAL ABSCESS  10/2010    Allergies:  No Known Allergies  Family History:  Family History  Problem Relation Age of Onset   HIV/AIDS Father    Stomach cancer Sister    Stomach cancer Brother     Social History:  Social History   Tobacco Use   Smoking status: Every Day    Current packs/day: 0.75    Average packs/day: 0.8 packs/day for 28.0 years (21.0 ttl pk-yrs)    Types: Cigarettes   Smokeless tobacco: Former  Building services engineer status: Never Used  Substance Use Topics   Alcohol use: Yes   Drug use: Yes    Types: Marijuana    Review of  symptoms:  Constitutional:  Negative for unexplained weight loss, night sweats, fever, chills ENT:  Negative for nose bleeds, sinus pain, painful swallowing CV:  Negative for chest pain, shortness of breath, exercise intolerance, palpitations, loss of consciousness Resp:  Negative for cough, wheezing, shortness of breath GI:  Negative for nausea, vomiting, diarrhea, bloody stools GU:  Positives noted in HPI; otherwise negative for gross hematuria, dysuria, urinary incontinence Neuro:  Negative for seizures, poor balance, limb weakness, slurred speech Psych:  Negative for lack of energy, depression, anxiety Endocrine:  Negative for polydipsia, polyuria, symptoms of hypoglycemia (dizziness, hunger, sweating) Hematologic:  Negative for anemia, purpura, petechia, prolonged or excessive bleeding, use of anticoagulants  Allergic:  Negative for difficulty breathing or choking as a result of exposure to anything; no shellfish allergy; no allergic response (rash/itch) to materials, foods  Physical exam: There were no vitals taken for this visit. GENERAL APPEARANCE:  Well appearing, well developed, well nourished, NAD HEENT: Atraumatic, Normocephalic, oropharynx clear. NECK: Supple without lymphadenopathy or thyromegaly. LUNGS: Clear to auscultation bilaterally. HEART: Regular Rate and Rhythm without murmurs, gallops, or rubs. ABDOMEN: Soft, non-tender, No Masses. EXTREMITIES: Moves all extremities well.  Without clubbing, cyanosis, or edema. NEUROLOGIC:  Alert and oriented x 3, normal gait, CN II-XII grossly intact.  MENTAL STATUS:  Appropriate. BACK:  Non-tender to palpation.  No CVAT SKIN:  Warm, dry and intact.    Results: No results found for this or any previous visit (from the past 24 hour(s)).

## 2023-02-22 DIAGNOSIS — F1020 Alcohol dependence, uncomplicated: Secondary | ICD-10-CM | POA: Diagnosis not present

## 2023-02-28 ENCOUNTER — Other Ambulatory Visit: Payer: Self-pay

## 2023-02-28 MED ORDER — AMLODIPINE BESYLATE 5 MG PO TABS
5.0000 mg | ORAL_TABLET | Freq: Every day | ORAL | 0 refills | Status: DC
  Filled 2023-02-28: qty 30, 30d supply, fill #0

## 2023-02-28 MED ORDER — PANTOPRAZOLE SODIUM 40 MG PO TBEC
40.0000 mg | DELAYED_RELEASE_TABLET | ORAL | 0 refills | Status: DC
  Filled 2023-02-28: qty 30, 30d supply, fill #0

## 2023-02-28 MED ORDER — TAMSULOSIN HCL 0.4 MG PO CAPS
0.4000 mg | ORAL_CAPSULE | Freq: Every day | ORAL | 0 refills | Status: DC
  Filled 2023-02-28: qty 30, 30d supply, fill #0

## 2023-03-01 ENCOUNTER — Other Ambulatory Visit: Payer: Self-pay

## 2023-03-04 ENCOUNTER — Encounter: Payer: Self-pay | Admitting: Physician Assistant

## 2023-03-04 ENCOUNTER — Ambulatory Visit: Payer: 59 | Admitting: Physician Assistant

## 2023-03-04 VITALS — BP 122/72 | Wt 136.0 lb

## 2023-03-04 DIAGNOSIS — I10 Essential (primary) hypertension: Secondary | ICD-10-CM | POA: Diagnosis not present

## 2023-03-04 DIAGNOSIS — F1010 Alcohol abuse, uncomplicated: Secondary | ICD-10-CM | POA: Diagnosis not present

## 2023-03-04 DIAGNOSIS — F17210 Nicotine dependence, cigarettes, uncomplicated: Secondary | ICD-10-CM | POA: Diagnosis not present

## 2023-03-04 DIAGNOSIS — K0889 Other specified disorders of teeth and supporting structures: Secondary | ICD-10-CM

## 2023-03-04 DIAGNOSIS — K2950 Unspecified chronic gastritis without bleeding: Secondary | ICD-10-CM

## 2023-03-04 DIAGNOSIS — Z720 Tobacco use: Secondary | ICD-10-CM

## 2023-03-04 NOTE — Progress Notes (Signed)
New Patient Office Visit  Subjective    Patient ID: Daniel [name], male    DOB: [birth year]  Age: 57 y.o. MRN: [id number]  CC:  Chief Complaint  Patient presents with   Dental Pain    HPI Marisol A [place] states that he is currently being treated for substance abuse at [facility] residential treatment center.  States that he arrived on Friday.  States that he has been experiencing 10 out of 10 dental pain since Saturday night.  States that he chipped a part of his tooth off while eating.  States that he has tried Tylenol and Orajel without relief.    States that he does not check his blood pressure at home.     Outpatient Encounter Medications as of 03/04/2023  Medication Sig   acetaminophen-codeine (TYLENOL #3) 300-30 MG tablet Take 1 tablet by mouth every 8 (eight) hours as needed for moderate pain. Use sparingly   amLODipine (NORVASC) 5 MG tablet Take 1 tablet (5 mg total) by mouth daily.   pantoprazole (PROTONIX) 40 MG tablet Take 1 tablet (40 mg total) by mouth daily.   tamsulosin (FLOMAX) 0.4 MG CAPS capsule Take 1 capsule (0.4 mg total) by mouth daily.   No facility-administered encounter medications on file as of 03/04/2023.    Past Medical History:  Diagnosis Date   Anxiety    Chronic lower back pain    Depression    Internal hemorrhoids with other complication    Nephrolithiasis    Rectal abscess     Past Surgical History:  Procedure Laterality Date   HERNIA REPAIR     INCISION AND DRAINAGE PERIRECTAL ABSCESS  10/2010    Family History  Problem Relation Age of Onset   HIV/AIDS Father    Stomach cancer Sister    Stomach cancer Brother     Social History   Socioeconomic History   Marital status: Single    Spouse name: Not on file   Number of children: Not on file   Years of education: Not on file   Highest education level: Not on file  Occupational History   Not on file  Tobacco Use   Smoking status: Every Day    Current packs/day: 0.75    Average  packs/day: 0.8 packs/day for 28.0 years (21.0 ttl pk-yrs)    Types: Cigarettes   Smokeless tobacco: Former  Building services engineer status: Never Used  Substance and Sexual Activity   Alcohol use: Yes   Drug use: Yes    Types: Marijuana   Sexual activity: Not Currently  Other Topics Concern   Not on file  Social History Narrative   Not on file   Social Determinants of Health   Financial Resource Strain: Not on file  Food Insecurity: No Food Insecurity (11/19/2022)   Hunger Vital Sign    Worried About Running Out of Food in the Last Year: Never true    Ran Out of Food in the Last Year: Never true  Transportation Needs: No Transportation Needs (11/19/2022)   PRAPARE - Administrator, Civil Service (Medical): No    Lack of Transportation (Non-Medical): No  Physical Activity: Not on file  Stress: Not on file  Social Connections: Not on file  Intimate Partner Violence: Not At Risk (11/19/2022)   Humiliation, Afraid, Rape, and Kick questionnaire    Fear of Current or Ex-Partner: No    Emotionally Abused: No    Physically Abused: No    Sexually  Abused: No    Review of Systems  Constitutional: Negative.   HENT: Negative.    Eyes: Negative.   Respiratory:  Negative for shortness of breath.   Cardiovascular:  Negative for chest pain.  Gastrointestinal: Negative.   Genitourinary: Negative.   Musculoskeletal: Negative.   Skin: Negative.   Neurological: Negative.   Endo/Heme/Allergies: Negative.   Psychiatric/Behavioral: Negative.          Objective    BP 122/72 (BP Location: Left Arm)   Wt 136 lb (61.7 kg)   SpO2 97%   BMI 20.68 kg/m   Physical Exam Vitals and nursing note reviewed.  Constitutional:      Appearance: Normal appearance.  HENT:     Head: Normocephalic and atraumatic.     Right Ear: External ear normal.     Left Ear: External ear normal.     Nose: Nose normal.     Mouth/Throat:     Lips: Pink.     Mouth: Mucous membranes are moist.      Dentition: Abnormal dentition. No gingival swelling.      Comments: Chipped tooth, near gum, possible exposed nerves   Cardiovascular:     Rate and Rhythm: Normal rate and regular rhythm.     Pulses: Normal pulses.     Heart sounds: Normal heart sounds.  Pulmonary:     Effort: Pulmonary effort is normal.     Breath sounds: Normal breath sounds.  Musculoskeletal:        General: Normal range of motion.     Cervical back: Normal range of motion and neck supple.  Skin:    General: Skin is warm and dry.  Neurological:     General: No focal deficit present.     Mental Status: He is alert and oriented to person, place, and time.  Psychiatric:        Mood and Affect: Mood normal.        Behavior: Behavior normal.        Thought Content: Thought content normal.        Judgment: Judgment normal.        Assessment & Plan:   Problem List Items Addressed This Visit       Cardiovascular and Mediastinum   Primary hypertension     Digestive   Chronic gastritis without bleeding     Other   Tobacco use   Alcohol abuse   Other Visit Diagnoses     Pain, dental    -  Primary   Relevant Orders   Ambulatory referral to Dentistry     1. Pain, dental Urgent referral placed to dentistry.  Patient unable to tolerate ibuprofen due to history of gastritis.  Continue Tylenol as needed.  Patient education given on supportive care.  Red flags given for prompt reevaluation - Ambulatory referral to Dentistry  2. Primary hypertension Encouraged to check blood pressure at home, keep a written log and have available for all office visit.  No refills needed today  3. Chronic gastritis without bleeding, unspecified gastritis type   4. Tobacco use   5. Alcohol abuse Currently in substance abuse treatment program   I have reviewed the patient's medical history (PMH, PSH, Social History, Family History, Medications, and allergies) , and have been updated if relevant. I spent 30 minutes  reviewing chart and  face to face time with patient.     Return if symptoms worsen or fail to improve.   Kasandra Knudsen Mayers, PA-C

## 2023-03-04 NOTE — Patient Instructions (Addendum)
It is important for you to follow-up with dentistry as soon as possible.  I have started a referral, they will reach out to you to schedule an appointment.  Please let us know if there is anything else we can do for you  Roney Jaffe, PA-C Physician Assistant [facility] [email or web address]   Dental Pain Dental pain is often a sign that something is wrong with your teeth or gums. It is also something that can occur following dental treatment. If you have dental pain, it is important to contact your dental care provider, especially if the cause of the pain has not been determined. Dental pain may be of varying intensity and can be caused by many things, including: Tooth decay (cavities or caries). Cavities are caused by bacteria that produce acids that irritate the nerve of your tooth, making it sensitive to air and hot or cold temperatures. This eventually causes discomfort or pain. Abscess or infection. Once the bacteria reach the inner part of the tooth (pulp), a bacterial infection (dental abscess) can occur. Pus typically collects at the end of the root of a tooth. Injury. A crack in the tooth. Gum recession exposing the root, and possibly the nerves, of a tooth. Gum (periodontal)disease. Abnormal grinding or clenching. Poor or improper home care. An unknown reason (idiopathic). Your pain may be mild or severe. It may occur when you are: Chewing. Exposed to hot or cold temperatures. Eating or drinking sugary foods or beverages, such as soda or candy. Your pain may be constant, or it may come and go without cause. Follow these instructions at home: The following actions may help to lessen any discomfort that you are feeling before or after getting dental care. Medicines Take over-the-counter and prescription medicines only as told by your dental care provider. If you were prescribed an antibiotic medicine, take it as told by  your dental care provider. Do not stop taking the antibiotic even if you start to feel better. Eating and drinking Avoid foods or drinks that cause you pain, such as: Very hot or very cold foods or drinks. Sweet or sugary foods or drinks. Managing pain and swelling  Ice can sometimes be used to reduce pain and swelling, especially if the pain is following dental treatment. If directed, put ice on the painful area of your face. To do this: Put ice in a plastic bag. Place a towel between your skin and the bag. Leave the ice on for 20 minutes, 2-3 times a day. Remove the ice if your skin turns bright red. This is very important. If you cannot feel pain, heat, or cold, you have a greater risk of damage to the area. Brushing your teeth To keep your mouth and gums healthy, brush your teeth twice a day using a fluoride toothpaste. Use a toothpaste made for sensitive teeth as directed by your dental care provider, especially if the root is exposed. Always brush your teeth with a soft-bristled toothbrush. This will help prevent irritation to your gums. General instructions Floss at least once a day. Do not apply heat to the outside of the face. Gargle with a mixture of salt and water 3-4 times a day or as needed. To make salt water, completely dissolve -1 tsp (3-6 g) of salt in 1 cup (237 mL) of warm water. Keep all follow-up visits. This is important. Contact a dental care provider if: You have any unexplained dental pain. Your pain is not controlled with medicines. Your symptoms get worse.  You have new symptoms. Get help right away if: You are unable to open your mouth. You are having trouble breathing or swallowing. You have a fever. You notice that your face, neck, or jaw is swollen. These symptoms may represent a serious problem that is an emergency. Do not wait to see if the symptoms will go away. Get medical help right away. Call your local emergency services (911 in the [place] Do not  drive yourself to the hospital. Summary Dental pain may be caused by many things, including tooth decay and infection. Your pain may be mild or severe. Take over-the-counter and prescription medicines only as told by your dental care provider. Watch your dental pain for any changes. Let your dental care provider know if your symptoms get worse. This information is not intended to replace advice given to you by your health care provider. Make sure you discuss any questions you have with your health care provider. Document Revised: 01/06/2020 Document Reviewed: 01/06/2020 Elsevier Patient Education  2024 [employer].

## 2023-03-18 ENCOUNTER — Encounter: Payer: Self-pay | Admitting: Physician Assistant

## 2023-03-18 ENCOUNTER — Ambulatory Visit: Payer: 59 | Admitting: Physician Assistant

## 2023-03-18 VITALS — BP 125/74 | HR 84 | Ht 69.0 in | Wt 144.0 lb

## 2023-03-18 DIAGNOSIS — K2930 Chronic superficial gastritis without bleeding: Secondary | ICD-10-CM | POA: Diagnosis not present

## 2023-03-18 DIAGNOSIS — K0889 Other specified disorders of teeth and supporting structures: Secondary | ICD-10-CM | POA: Diagnosis not present

## 2023-03-18 DIAGNOSIS — I10 Essential (primary) hypertension: Secondary | ICD-10-CM

## 2023-03-18 DIAGNOSIS — F1010 Alcohol abuse, uncomplicated: Secondary | ICD-10-CM

## 2023-03-18 DIAGNOSIS — N200 Calculus of kidney: Secondary | ICD-10-CM | POA: Diagnosis not present

## 2023-03-18 MED ORDER — PANTOPRAZOLE SODIUM 40 MG PO TBEC
40.0000 mg | DELAYED_RELEASE_TABLET | Freq: Every day | ORAL | 1 refills | Status: DC
  Filled 2023-07-18 – 2023-12-24 (×2): qty 30, 30d supply, fill #0

## 2023-03-18 MED ORDER — TAMSULOSIN HCL 0.4 MG PO CAPS
0.4000 mg | ORAL_CAPSULE | Freq: Every day | ORAL | 1 refills | Status: DC
  Filled 2023-07-18 – 2023-12-24 (×2): qty 30, 30d supply, fill #0

## 2023-03-18 MED ORDER — AMLODIPINE BESYLATE 5 MG PO TABS
5.0000 mg | ORAL_TABLET | Freq: Every day | ORAL | 1 refills | Status: DC
Start: 2023-03-18 — End: 2024-02-13
  Filled 2023-07-18 – 2023-12-24 (×2): qty 30, 30d supply, fill #0

## 2023-03-18 NOTE — Progress Notes (Unsigned)
Established Patient Office Visit  Subjective   Patient ID: Daniel [name], male    DOB: 10-Dec-1965  Age: 57 y.o. MRN: [id number]  Chief Complaint  Patient presents with   Medication Refill   Referral    States that he continues to be treated for substance abuse at [facility] recovery center.  States that he will be transitioning to a long-term care in [place] in a few more weeks.  States that he has not heard from the dentistry referral.  States that he continues to have dental pain due to the broken tooth.  States that he has been using Tylenol with very little relief.  No other concerns today      Past Medical History:  Diagnosis Date   Anxiety    Chronic lower back pain    Depression    Internal hemorrhoids with other complication    Nephrolithiasis    Rectal abscess    Social History   Socioeconomic History   Marital status: Single    Spouse name: Not on file   Number of children: Not on file   Years of education: Not on file   Highest education level: Not on file  Occupational History   Not on file  Tobacco Use   Smoking status: Every Day    Current packs/day: 0.75    Average packs/day: 0.8 packs/day for 28.0 years (21.0 ttl pk-yrs)    Types: Cigarettes   Smokeless tobacco: Former  Building services engineer status: Never Used  Substance and Sexual Activity   Alcohol use: Yes   Drug use: Yes    Types: Marijuana   Sexual activity: Not Currently  Other Topics Concern   Not on file  Social History Narrative   Not on file   Social Determinants of Health   Financial Resource Strain: Not on file  Food Insecurity: No Food Insecurity (11/19/2022)   Hunger Vital Sign    Worried About Running Out of Food in the Last Year: Never true    Ran Out of Food in the Last Year: Never true  Transportation Needs: No Transportation Needs (11/19/2022)   PRAPARE - Administrator, Civil Service (Medical): No    Lack of Transportation (Non-Medical): No  Physical  Activity: Not on file  Stress: Not on file  Social Connections: Not on file  Intimate Partner Violence: Not At Risk (11/19/2022)   Humiliation, Afraid, Rape, and Kick questionnaire    Fear of Current or Ex-Partner: No    Emotionally Abused: No    Physically Abused: No    Sexually Abused: No   Family History  Problem Relation Age of Onset   HIV/AIDS Father    Stomach cancer Sister    Stomach cancer Brother    No Known Allergies  Review of Systems  Constitutional: Negative.   HENT: Negative.    Eyes: Negative.   Respiratory:  Negative for shortness of breath.   Cardiovascular:  Negative for chest pain.  Gastrointestinal: Negative.   Genitourinary: Negative.   Musculoskeletal: Negative.   Skin: Negative.   Neurological: Negative.   Endo/Heme/Allergies: Negative.   Psychiatric/Behavioral: Negative.        Objective:     BP 125/74 (BP Location: Left Arm, Patient Position: Sitting, Cuff Size: Large)   Pulse 84   Ht 5\' 9"  (1.753 m)   Wt 144 lb (65.3 kg)   SpO2 97%   BMI 21.27 kg/m  BP Readings from Last 3 Encounters:  03/18/23 125/74  03/04/23 122/72  01/17/23 123/83   Wt Readings from Last 3 Encounters:  03/18/23 144 lb (65.3 kg)  03/04/23 136 lb (61.7 kg)  01/17/23 129 lb 3.2 oz (58.6 kg)    Physical Exam Vitals and nursing note reviewed.  Constitutional:      Appearance: Normal appearance.  HENT:     Head: Normocephalic and atraumatic.     Right Ear: External ear normal.     Left Ear: External ear normal.     Mouth/Throat:     Lips: Pink.     Mouth: Mucous membranes are moist.     Dentition: Abnormal dentition. Dental caries present.     Pharynx: Oropharynx is clear.  Eyes:     Extraocular Movements: Extraocular movements intact.     Conjunctiva/sclera: Conjunctivae normal.     Pupils: Pupils are equal, round, and reactive to light.  Cardiovascular:     Rate and Rhythm: Normal rate and regular rhythm.     Pulses: Normal pulses.     Heart sounds:  Normal heart sounds.  Pulmonary:     Effort: Pulmonary effort is normal.     Breath sounds: Normal breath sounds.  Musculoskeletal:        General: Normal range of motion.     Cervical back: Normal range of motion and neck supple.  Skin:    General: Skin is warm and dry.  Neurological:     General: No focal deficit present.     Mental Status: He is alert and oriented to person, place, and time.  Psychiatric:        Mood and Affect: Mood normal.        Behavior: Behavior normal.        Thought Content: Thought content normal.        Judgment: Judgment normal.       Assessment & Plan:   Problem List Items Addressed This Visit       Cardiovascular and Mediastinum   Primary hypertension   Relevant Medications   amLODipine (NORVASC) 5 MG tablet     Digestive   Chronic gastritis without bleeding   Relevant Medications   pantoprazole (PROTONIX) 40 MG tablet   Other Visit Diagnoses     Pain, dental    -  Primary   Nephrolithiasis       Relevant Medications   tamsulosin (FLOMAX) 0.4 MG CAPS capsule     1. Pain, dental Patient given information regarding referral, encouraged to follow-up with dentistry as soon as able.  Red flags given for prompt reevaluation  2. Nephrolithiasis Refills given, continue current regimen - tamsulosin (FLOMAX) 0.4 MG CAPS capsule; Take 1 capsule (0.4 mg total) by mouth daily.  Dispense: 30 capsule; Refill: 1  3. Chronic superficial gastritis without bleeding Refills given, continue current regimen - pantoprazole (PROTONIX) 40 MG tablet; Take 1 tablet (40 mg total) by mouth daily.  Dispense: 30 tablet; Refill: 1  4. Primary hypertension Refills given, continue current regimen - amLODipine (NORVASC) 5 MG tablet; Take 1 tablet (5 mg total) by mouth daily.  Dispense: 30 tablet; Refill: 1  5. Alcohol abuse Currently in substance abuse treatment program   I have reviewed the patient's medical history (PMH, PSH, Social History, Family  History, Medications, and allergies) , and have been updated if relevant. I spent 20 minutes reviewing chart and  face to face time with patient.     Return in about 2 weeks (around 04/01/2023) for With [facility]U.    Katarzyna Wolven  S Mayers, PA-C

## 2023-03-18 NOTE — Patient Instructions (Signed)
I do encourage you to check your blood pressure on a daily basis, keep a written log and have available for all office visits.  Please follow-up with the mobile unit in 2 weeks.  Your referral for dentistry was sent to  [facility] [phone]  I encourage you to reach out to them to schedule an appointment.  Roney Jaffe, PA-C Physician Assistant [facility] [email or web address]  How to Take Your Blood Pressure Blood pressure is a measurement of how strongly your blood is pressing against the walls of your arteries. Arteries are blood vessels that carry blood from your heart throughout your body. Your health care provider takes your blood pressure at each office visit. You can also take your own blood pressure at home with a blood pressure monitor. You may need to take your own blood pressure to: Confirm a diagnosis of high blood pressure (hypertension). Monitor your blood pressure over time. Make sure your blood pressure medicine is working. Supplies needed: Blood pressure monitor. A chair to sit in. This should be a chair where you can sit upright with your back supported. Do not sit on a soft couch or an armchair. Table or desk. Small notebook and pencil or pen. How to prepare To get the most accurate reading, avoid the following for 30 minutes before you check your blood pressure: Drinking caffeine. Drinking alcohol. Eating. Smoking. Exercising. Five minutes before you check your blood pressure: Use the bathroom and urinate so that you have an empty bladder. Sit quietly in a chair. Do not talk. How to take your blood pressure To check your blood pressure, follow the instructions in the manual that came with your blood pressure monitor. If you have a digital blood pressure monitor, the instructions may be as follows: Sit up straight in a chair. Place your feet on the floor. Do not cross your ankles or legs. Rest  your left arm at the level of your heart on a table or desk or on the arm of a chair. Pull up your shirt sleeve. Wrap the blood pressure cuff around the upper part of your left arm, 1 inch (2.5 cm) above your elbow. It is best to wrap the cuff around bare skin. Fit the cuff snugly, but not too tightly, around your arm. You should be able to place only one finger between the cuff and your arm. Position the cord so that it rests in the bend of your elbow. Press the power button. Sit quietly while the cuff inflates and deflates. Read the digital reading on the monitor screen and write the numbers down (record them) in a notebook. Wait 2-3 minutes, then repeat the steps, starting at step 1. What does my blood pressure reading mean? A blood pressure reading consists of a higher number over a lower number. Ideally, your blood pressure should be below 120/80. The first ("top") number is called the systolic pressure. It is a measure of the pressure in your arteries as your heart beats. The second ("bottom") number is called the diastolic pressure. It is a measure of the pressure in your arteries as the heart relaxes. Blood pressure is classified into four stages. The following are the stages for adults who do not have a short-term serious illness or a chronic condition. Systolic pressure and diastolic pressure are measured in a unit called mm Hg (millimeters of mercury).  Normal Systolic pressure: below 120. Diastolic pressure: below 80. Elevated Systolic pressure: 120-129. Diastolic pressure: below 80. Hypertension stage 1 Systolic  pressure: 130-139. Diastolic pressure: 80-89. Hypertension stage 2 Systolic pressure: 140 or above. Diastolic pressure: 90 or above. You can have elevated blood pressure or hypertension even if only the systolic or only the diastolic number in your reading is higher than normal. Follow these instructions at home: Medicines Take over-the-counter and prescription  medicines only as told by your health care provider. Tell your health care provider if you are having any side effects from blood pressure medicine. General instructions Check your blood pressure as often as recommended by your health care provider. Check your blood pressure at the same time every day. Take your monitor to the next appointment with your health care provider to make sure that: You are using it correctly. It provides accurate readings. Understand what your goal blood pressure numbers are. Keep all follow-up visits. This is important. General tips Your health care provider can suggest a reliable monitor that will meet your needs. There are several types of home blood pressure monitors. Choose a monitor that has an arm cuff. Do not choose a monitor that measures your blood pressure from your wrist or finger. Choose a cuff that wraps snugly, not too tight or too loose, around your upper arm. You should be able to fit only one finger between your arm and the cuff. You can buy a blood pressure monitor at most drugstores or online. Where to find more information American Heart Association: www.heart.org Contact a health care provider if: Your blood pressure is consistently high. Your blood pressure is suddenly low. Get help right away if: Your systolic blood pressure is higher than 180. Your diastolic blood pressure is higher than 120. These symptoms may be an emergency. Get help right away. Call 911. Do not wait to see if the symptoms will go away. Do not drive yourself to the hospital. Summary Blood pressure is a measurement of how strongly your blood is pressing against the walls of your arteries. A blood pressure reading consists of a higher number over a lower number. Ideally, your blood pressure should be below 120/80. Check your blood pressure at the same time every day. Avoid caffeine, alcohol, smoking, and exercise for 30 minutes prior to checking your blood pressure.  These agents can affect the accuracy of the blood pressure reading. This information is not intended to replace advice given to you by your health care provider. Make sure you discuss any questions you have with your health care provider. Document Revised: 12/15/2020 Document Reviewed: 12/15/2020 Elsevier Patient Education  2024 [employer].

## 2023-03-19 ENCOUNTER — Encounter: Payer: Self-pay | Admitting: Physician Assistant

## 2023-04-26 ENCOUNTER — Ambulatory Visit: Payer: Self-pay | Admitting: Psychology

## 2023-04-26 DIAGNOSIS — Z09 Encounter for follow-up examination after completed treatment for conditions other than malignant neoplasm: Secondary | ICD-10-CM

## 2023-05-02 ENCOUNTER — Ambulatory Visit: Payer: 59 | Admitting: Family Medicine

## 2023-05-03 ENCOUNTER — Other Ambulatory Visit: Payer: Self-pay | Admitting: Psychology

## 2023-05-07 ENCOUNTER — Other Ambulatory Visit: Payer: Self-pay | Admitting: Psychology

## 2023-05-27 ENCOUNTER — Other Ambulatory Visit: Payer: Self-pay | Admitting: Psychology

## 2023-06-24 NOTE — Progress Notes (Signed)
 Case Management 04/26/2023  Client needed help finishing disability paperwork. Client is working with a law firm and all that was needed was to sign the paperwork. Client signed paperwork and social worker will send off packet to law firm at the post office. Client expressed interest in therapy and we talked about starting that in our next session. Client was in need of food from the pantry. The social work team will check back in the next couple weeks to see if the client is ready to start therapy and any other needs.

## 2023-07-08 ENCOUNTER — Ambulatory Visit: Payer: MEDICAID | Admitting: Psychology

## 2023-07-08 DIAGNOSIS — Z09 Encounter for follow-up examination after completed treatment for conditions other than malignant neoplasm: Secondary | ICD-10-CM

## 2023-07-16 ENCOUNTER — Other Ambulatory Visit: Payer: Self-pay | Admitting: Physician Assistant

## 2023-07-16 ENCOUNTER — Other Ambulatory Visit: Payer: Self-pay | Admitting: Nurse Practitioner

## 2023-07-16 ENCOUNTER — Other Ambulatory Visit: Payer: Self-pay

## 2023-07-16 ENCOUNTER — Ambulatory Visit: Payer: MEDICAID | Admitting: Nurse Practitioner

## 2023-07-16 VITALS — BP 138/82 | HR 70 | Temp 98.2°F | Resp 16 | Ht 68.0 in | Wt 132.2 lb

## 2023-07-16 DIAGNOSIS — N200 Calculus of kidney: Secondary | ICD-10-CM

## 2023-07-16 DIAGNOSIS — I10 Essential (primary) hypertension: Secondary | ICD-10-CM

## 2023-07-16 DIAGNOSIS — K2930 Chronic superficial gastritis without bleeding: Secondary | ICD-10-CM

## 2023-07-16 DIAGNOSIS — N63 Unspecified lump in unspecified breast: Secondary | ICD-10-CM

## 2023-07-16 DIAGNOSIS — N6310 Unspecified lump in the right breast, unspecified quadrant: Secondary | ICD-10-CM

## 2023-07-16 LAB — GLUCOSE, POCT (MANUAL RESULT ENTRY)
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
## 2023-07-17 ENCOUNTER — Ambulatory Visit: Payer: MEDICAID | Attending: Physician Assistant | Admitting: Physician Assistant

## 2023-07-17 ENCOUNTER — Other Ambulatory Visit: Payer: Self-pay

## 2023-07-17 VITALS — BP 124/75 | HR 69 | Temp 98.1°F | Ht 68.0 in | Wt 133.0 lb

## 2023-07-17 DIAGNOSIS — Z1331 Encounter for screening for depression: Secondary | ICD-10-CM

## 2023-07-17 DIAGNOSIS — N6341 Unspecified lump in right breast, subareolar: Secondary | ICD-10-CM

## 2023-07-17 MED ORDER — ACETAMINOPHEN-CODEINE 300-30 MG PO TABS
1.0000 | ORAL_TABLET | Freq: Three times a day (TID) | ORAL | 0 refills | Status: DC | PRN
  Filled 2023-07-17: qty 15, 5d supply, fill #0

## 2023-07-17 MED ORDER — CEPHALEXIN 500 MG PO CAPS
500.0000 mg | ORAL_CAPSULE | Freq: Three times a day (TID) | ORAL | 0 refills | Status: DC
Start: 2023-07-17 — End: 2023-08-21
  Filled 2023-07-17: qty 21, 7d supply, fill #0

## 2023-07-17 MED ORDER — ACETAMINOPHEN-CODEINE 300-30 MG PO TABS
1.0000 | ORAL_TABLET | Freq: Three times a day (TID) | ORAL | 0 refills | Status: DC | PRN
Start: 2023-07-17 — End: 2023-10-04

## 2023-07-17 NOTE — Patient Instructions (Signed)
 [facility] [address], [place], [place] [address] [phone] or [phone] Walk-in urgent care 24/7 for anyone  For [facility] ONLY New patient assessments and therapy walk-ins: Monday and Wednesday 8am-11am First and second Friday 1pm-5pm New patient psychiatry and medication management walk-ins: Mondays, Wednesdays, Thursdays, Fridays 8am-11am No psychiatry walk-ins on Tuesday

## 2023-07-17 NOTE — Progress Notes (Signed)
 Patient ID: Daniel [name], male   DOB: November 04, 1965, 58 y.o.   MRN: [id number]   Kyvon Hu, is a 58 y.o. male  MVH:[id number]  [id number]:[id number]  DOB - Sep 23, 1965  Chief Complaint  Patient presents with   Breast Mass    Breast mass on X3 mo -pain, tenderness        Subjective:   Daniel [name] is a 57 y.o. male here today after being seen on a mobile unit and referred for mammo and ultrasound scheduled for 4/9.  He is aware of the appt.  The lump in his R chest is beneath the areola.  It has become much more tender.  He denies fever.  No body aches.  He is asking for some tylenol #3 to help with the tenderness.    He is depressed and scores +depression screening due to needing to get disability forms filled out and feels like "things aren't the same without Dr Delford Field."  Denies SI.  He does have passive SI and changes his #9 on PHQ9=1 on further questioning.  He has passive thoughts of "wishing I was not here. "  No plan or intent to harm himself.   ALLERGIES: No Known Allergies  PAST MEDICAL HISTORY: Past Medical History:  Diagnosis Date   Anxiety    Chronic lower back pain    Depression    Internal hemorrhoids with other complication    Nephrolithiasis    Rectal abscess     MEDICATIONS AT HOME: Prior to Admission medications   Medication Sig Start Date End Date Taking? Authorizing Provider  amLODipine (NORVASC) 5 MG tablet Take 1 tablet (5 mg total) by mouth daily. 03/18/23  Yes Mayers, Cari S, PA-C  cephALEXin (KEFLEX) 500 MG capsule Take 1 capsule (500 mg total) by mouth 3 (three) times daily. 07/17/23  Yes Georgian Co M, PA-C  pantoprazole (PROTONIX) 40 MG tablet Take 1 tablet (40 mg total) by mouth daily. 03/18/23 03/17/24 Yes Mayers, Cari S, PA-C  tamsulosin (FLOMAX) 0.4 MG CAPS capsule Take 1 capsule (0.4 mg total) by mouth daily. 03/18/23  Yes Mayers, Cari S, PA-C  acetaminophen-codeine (TYLENOL #3) 300-30 MG tablet Take 1 tablet by mouth every 8 (eight) hours as needed for  moderate pain (pain score 4-6). Use sparingly 07/17/23   Anders Simmonds, PA-C    ROS: Neg HEENT Neg resp Neg cardiac Neg GI Neg GU Neg MS Neg psych Neg neuro  Objective:   Vitals:   07/17/23 0850  BP: 124/75  Pulse: 69  Temp: 98.1 F (36.7 C)  TempSrc: Oral  SpO2: 99%  Weight: 133 lb (60.3 kg)  Height: 5\' 8"  (1.727 m)   Exam General appearance : Awake, alert, not in any distress. Speech Clear. Not toxic looking HEENT: Atraumatic and Normocephalic R breast with subareolar lump that is tender and mobile ~3cm.  No draining or expression from nipple.   Neck: Supple, no JVD. No cervical lymphadenopathy.  Chest: Good air entry bilaterally, CTAB.  No rales/rhonchi/wheezing CVS: S1 S2 regular, no murmurs.  Extremities: B/L Lower Ext shows no edema, both legs are warm to touch Neurology: Awake alert, and oriented X 3, CN II-XII intact, Non focal Skin: No Rash  Data Review Lab Results  Component Value Date   HGBA1C 5.2 12/20/2021    Assessment & Plan   1. Subareolar mass of right breast (Primary) - cephALEXin (KEFLEX) 500 MG capsule; Take 1 capsule (500 mg total) by mouth 3 (three) times daily.  Dispense: 21 capsule;  Refill: 0 - acetaminophen-codeine (TYLENOL #3) 300-30 MG tablet; Take 1 tablet by mouth every 8 (eight) hours as needed for moderate pain (pain score 4-6). Use sparingly  Dispense: 15 tablet; Refill: 0  2. Positive depression screening He has a Veterinary surgeon.  He does not want depression medication today.   [facility] [address] [place], [place] [address] [phone] or [phone] Walk-in urgent care 24/7 for anyone  For [facility] ONLY New patient assessments and therapy walk-ins: Monday and Wednesday 8am-11am First and second Friday 1pm-5pm New patient psychiatry and medication management walk-ins: Mondays, Wednesdays, Thursdays, Fridays 8am-11am No psychiatry walk-ins on Tuesday      Return in about  4 weeks (around 08/14/2023) for PCP for chronic conditions-please schedule with who his PCP is going to be!.  The patient was given clear instructions to go to ER or return to medical center if symptoms don't improve, worsen or new problems develop. The patient verbalized understanding. The patient was told to call to get lab results if they haven't heard anything in the next week.      Georgian Co, PA-C [facility] [place], [place] [phone]   07/17/2023, 9:21 AM

## 2023-07-18 ENCOUNTER — Other Ambulatory Visit: Payer: Self-pay

## 2023-07-23 ENCOUNTER — Other Ambulatory Visit: Payer: Self-pay

## 2023-07-23 MED ORDER — AZITHROMYCIN 250 MG PO TABS
ORAL_TABLET | ORAL | 0 refills | Status: AC
  Filled 2023-07-23: qty 6, 5d supply, fill #0

## 2023-07-23 MED ORDER — IBUPROFEN 800 MG PO TABS
800.0000 mg | ORAL_TABLET | Freq: Four times a day (QID) | ORAL | 0 refills | Status: DC | PRN
  Filled 2023-07-23: qty 16, 4d supply, fill #0

## 2023-07-24 ENCOUNTER — Encounter

## 2023-07-24 ENCOUNTER — Other Ambulatory Visit

## 2023-07-30 ENCOUNTER — Other Ambulatory Visit: Payer: Self-pay

## 2023-08-01 ENCOUNTER — Other Ambulatory Visit: Payer: Self-pay

## 2023-08-21 ENCOUNTER — Emergency Department (HOSPITAL_COMMUNITY): Payer: MEDICAID

## 2023-08-21 ENCOUNTER — Emergency Department (HOSPITAL_COMMUNITY)
Admission: EM | Admit: 2023-08-21 | Discharge: 2023-08-22 | Disposition: A | Discharge status: Home / Self Care | Payer: MEDICAID | Attending: Emergency Medicine | Admitting: Emergency Medicine

## 2023-08-21 ENCOUNTER — Other Ambulatory Visit: Payer: Self-pay

## 2023-08-21 ENCOUNTER — Encounter (HOSPITAL_COMMUNITY): Payer: Self-pay

## 2023-08-21 DIAGNOSIS — N6341 Unspecified lump in right breast, subareolar: Secondary | ICD-10-CM | POA: Diagnosis not present

## 2023-08-21 DIAGNOSIS — Y92410 Unspecified street and highway as the place of occurrence of the external cause: Secondary | ICD-10-CM | POA: Diagnosis not present

## 2023-08-21 DIAGNOSIS — S50311A Abrasion of right elbow, initial encounter: Secondary | ICD-10-CM | POA: Diagnosis not present

## 2023-08-21 DIAGNOSIS — R1010 Upper abdominal pain, unspecified: Secondary | ICD-10-CM | POA: Diagnosis not present

## 2023-08-21 DIAGNOSIS — R519 Headache, unspecified: Secondary | ICD-10-CM | POA: Diagnosis not present

## 2023-08-21 DIAGNOSIS — S2231XA Fracture of one rib, right side, initial encounter for closed fracture: Secondary | ICD-10-CM | POA: Diagnosis not present

## 2023-08-21 DIAGNOSIS — S80211A Abrasion, right knee, initial encounter: Secondary | ICD-10-CM | POA: Insufficient documentation

## 2023-08-21 DIAGNOSIS — R079 Chest pain, unspecified: Secondary | ICD-10-CM | POA: Diagnosis present

## 2023-08-21 DIAGNOSIS — Y9389 Activity, other specified: Secondary | ICD-10-CM | POA: Insufficient documentation

## 2023-08-21 DIAGNOSIS — Z23 Encounter for immunization: Secondary | ICD-10-CM | POA: Diagnosis not present

## 2023-08-21 DIAGNOSIS — W19XXXA Unspecified fall, initial encounter: Secondary | ICD-10-CM

## 2023-08-21 LAB — COMPREHENSIVE METABOLIC PANEL WITH GFR
ALT: 19 U/L (ref 0–44)
AST: 27 U/L (ref 15–41)
Albumin: 3.9 g/dL (ref 3.5–5.0)
Alkaline Phosphatase: 74 U/L (ref 38–126)
Anion gap: 10 (ref 5–15)
BUN: 7 mg/dL (ref 6–20)
CO2: 24 mmol/L (ref 22–32)
Calcium: 8.8 mg/dL — ABNORMAL LOW (ref 8.9–10.3)
Chloride: 105 mmol/L (ref 98–111)
Creatinine, Ser: 0.73 mg/dL (ref 0.61–1.24)
GFR, Estimated: >60 mL/min (ref >60)
Glucose, Bld: 83 mg/dL (ref 70–99)
Potassium: 3.5 mmol/L (ref 3.5–5.1)
Sodium: 139 mmol/L (ref 135–145)
Total Bilirubin: 0.7 mg/dL (ref 0.0–1.2)
Total Protein: 7.8 g/dL (ref 6.5–8.1)

## 2023-08-21 LAB — CBC WITH DIFFERENTIAL/PLATELET
Abs Immature Granulocytes: 0.02 10*3/uL (ref 0.00–0.07)
Basophils Absolute: 0 10*3/uL (ref 0.0–0.1)
Basophils Relative: 0 %
Eosinophils Absolute: 0.1 10*3/uL (ref 0.0–0.5)
Eosinophils Relative: 1 %
HCT: 43.4 % (ref 39.0–52.0)
Hemoglobin: 13.8 g/dL (ref 13.0–17.0)
Immature Granulocytes: 0 %
Lymphocytes Relative: 21 %
Lymphs Abs: 1.9 10*3/uL (ref 0.7–4.0)
MCH: 30.6 pg (ref 26.0–34.0)
MCHC: 31.8 g/dL (ref 30.0–36.0)
MCV: 96.2 fL (ref 80.0–100.0)
Monocytes Absolute: 0.5 10*3/uL (ref 0.1–1.0)
Monocytes Relative: 5 %
Neutro Abs: 6.7 10*3/uL (ref 1.7–7.7)
Neutrophils Relative %: 73 %
Platelets: 230 10*3/uL (ref 150–400)
RBC: 4.51 MIL/uL (ref 4.22–5.81)
RDW: 12.8 % (ref 11.5–15.5)
WBC: 9.3 10*3/uL (ref 4.0–10.5)
nRBC: 0 % (ref 0.0–0.2)

## 2023-08-21 LAB — LIPASE, BLOOD: Lipase: 28 U/L (ref 11–51)

## 2023-08-21 LAB — ETHANOL: Alcohol, Ethyl (B): 62 mg/dL — ABNORMAL HIGH (ref <15)

## 2023-08-21 LAB — TROPONIN I (HIGH SENSITIVITY): Troponin I (High Sensitivity): 6 ng/L (ref <18)

## 2023-08-21 MED ORDER — CEPHALEXIN 500 MG PO CAPS
500.0000 mg | ORAL_CAPSULE | Freq: Two times a day (BID) | ORAL | 0 refills | Status: DC
  Filled 2023-08-21: qty 20, 10d supply, fill #0

## 2023-08-21 MED ORDER — TETANUS-DIPHTH-ACELL PERTUSSIS 5-2.5-18.5 LF-MCG/0.5 IM SUSY
0.5000 mL | PREFILLED_SYRINGE | Freq: Once | INTRAMUSCULAR | Status: AC
  Administered 2023-08-21: 0.5 mL via INTRAMUSCULAR
  Filled 2023-08-21: qty 0.5

## 2023-08-21 MED ORDER — IOHEXOL 300 MG/ML  SOLN
100.0000 mL | Freq: Once | INTRAMUSCULAR | Status: AC | PRN
  Administered 2023-08-21: 100 mL via INTRAVENOUS

## 2023-08-21 MED ORDER — NAPROXEN 500 MG PO TABS
500.0000 mg | ORAL_TABLET | Freq: Two times a day (BID) | ORAL | 0 refills | Status: DC
  Filled 2023-08-21: qty 30, 15d supply, fill #0

## 2023-08-21 MED ORDER — OXYCODONE-ACETAMINOPHEN 5-325 MG PO TABS
1.0000 | ORAL_TABLET | Freq: Once | ORAL | Status: AC
  Administered 2023-08-21: 1 via ORAL
  Filled 2023-08-21: qty 1
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
## 2023-08-22 ENCOUNTER — Other Ambulatory Visit: Payer: Self-pay

## 2023-08-22 MED ORDER — OXYCODONE-ACETAMINOPHEN 5-325 MG PO TABS
1.0000 | ORAL_TABLET | Freq: Four times a day (QID) | ORAL | 0 refills | Status: DC | PRN
  Filled 2023-08-22: qty 6, 1d supply, fill #0
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
## 2023-08-23 ENCOUNTER — Other Ambulatory Visit: Payer: Self-pay

## 2023-09-02 NOTE — Progress Notes (Signed)
 Case Management 07/08/2023  Client came by today to say that the disability specialists have told him that everything is turned in. There is nothing to do but to sit and wait. If it should get denied that they will handle that as well. They will keep him updated via the mail. He also informed me that they threw all of his stuff away at the [facility]. He was very disappointed in finding this out. He felt betrayed because he was told that they were going to hold onto it for him. SW will try and collect some items for the client. SW will check in on the client in 2 weeks.

## 2023-09-05 ENCOUNTER — Ambulatory Visit: Admitting: Family Medicine

## 2023-09-30 ENCOUNTER — Observation Stay (HOSPITAL_COMMUNITY): Payer: MEDICAID

## 2023-09-30 ENCOUNTER — Encounter (HOSPITAL_COMMUNITY): Payer: Self-pay

## 2023-09-30 ENCOUNTER — Inpatient Hospital Stay (HOSPITAL_COMMUNITY)
Admission: EM | Admit: 2023-09-30 | Discharge: 2023-10-04 | DRG: 392 | Disposition: A | Discharge status: Home / Self Care | Payer: MEDICAID | Attending: Family Medicine | Admitting: Family Medicine

## 2023-09-30 ENCOUNTER — Other Ambulatory Visit: Payer: Self-pay

## 2023-09-30 ENCOUNTER — Emergency Department (HOSPITAL_COMMUNITY): Payer: MEDICAID

## 2023-09-30 DIAGNOSIS — F17210 Nicotine dependence, cigarettes, uncomplicated: Secondary | ICD-10-CM | POA: Diagnosis present

## 2023-09-30 DIAGNOSIS — N200 Calculus of kidney: Secondary | ICD-10-CM | POA: Diagnosis present

## 2023-09-30 DIAGNOSIS — Z830 Family history of human immunodeficiency virus [HIV] disease: Secondary | ICD-10-CM

## 2023-09-30 DIAGNOSIS — Z720 Tobacco use: Secondary | ICD-10-CM | POA: Diagnosis present

## 2023-09-30 DIAGNOSIS — I160 Hypertensive urgency: Secondary | ICD-10-CM | POA: Diagnosis present

## 2023-09-30 DIAGNOSIS — R109 Unspecified abdominal pain: Secondary | ICD-10-CM | POA: Diagnosis not present

## 2023-09-30 DIAGNOSIS — I10 Essential (primary) hypertension: Secondary | ICD-10-CM | POA: Diagnosis present

## 2023-09-30 DIAGNOSIS — Z91018 Allergy to other foods: Secondary | ICD-10-CM

## 2023-09-30 DIAGNOSIS — E872 Acidosis, unspecified: Secondary | ICD-10-CM | POA: Diagnosis present

## 2023-09-30 DIAGNOSIS — Z800 Family history of malignant neoplasm of digestive organs: Secondary | ICD-10-CM

## 2023-09-30 DIAGNOSIS — F1010 Alcohol abuse, uncomplicated: Secondary | ICD-10-CM | POA: Diagnosis present

## 2023-09-30 DIAGNOSIS — Z79899 Other long term (current) drug therapy: Secondary | ICD-10-CM

## 2023-09-30 DIAGNOSIS — I700 Atherosclerosis of aorta: Secondary | ICD-10-CM | POA: Diagnosis present

## 2023-09-30 DIAGNOSIS — Z91148 Patient's other noncompliance with medication regimen for other reason: Secondary | ICD-10-CM

## 2023-09-30 DIAGNOSIS — N3001 Acute cystitis with hematuria: Principal | ICD-10-CM

## 2023-09-30 DIAGNOSIS — R1110 Vomiting, unspecified: Secondary | ICD-10-CM | POA: Diagnosis not present

## 2023-09-30 DIAGNOSIS — Q444 Choledochal cyst: Secondary | ICD-10-CM

## 2023-09-30 DIAGNOSIS — F1210 Cannabis abuse, uncomplicated: Secondary | ICD-10-CM | POA: Insufficient documentation

## 2023-09-30 DIAGNOSIS — K2900 Acute gastritis without bleeding: Principal | ICD-10-CM | POA: Diagnosis present

## 2023-09-30 DIAGNOSIS — K2950 Unspecified chronic gastritis without bleeding: Secondary | ICD-10-CM | POA: Diagnosis present

## 2023-09-30 DIAGNOSIS — F1410 Cocaine abuse, uncomplicated: Secondary | ICD-10-CM | POA: Diagnosis present

## 2023-09-30 LAB — CBC
HCT: 42.9 % (ref 39.0–52.0)
HCT: 45.2 % (ref 39.0–52.0)
Hemoglobin: 13.9 g/dL (ref 13.0–17.0)
Hemoglobin: 14.5 g/dL (ref 13.0–17.0)
MCH: 31 pg (ref 26.0–34.0)
MCH: 30.6 pg (ref 26.0–34.0)
MCHC: 32.4 g/dL (ref 30.0–36.0)
MCHC: 32.1 g/dL (ref 30.0–36.0)
MCV: 95.5 fL (ref 80.0–100.0)
MCV: 95.4 fL (ref 80.0–100.0)
Platelets: 201 10*3/uL (ref 150–400)
Platelets: 216 10*3/uL (ref 150–400)
RBC: 4.49 MIL/uL (ref 4.22–5.81)
RBC: 4.74 MIL/uL (ref 4.22–5.81)
RDW: 13.7 % (ref 11.5–15.5)
RDW: 13.6 % (ref 11.5–15.5)
WBC: 9.2 10*3/uL (ref 4.0–10.5)
WBC: 7.6 10*3/uL (ref 4.0–10.5)
nRBC: 0 % (ref 0.0–0.2)
nRBC: 0 % (ref 0.0–0.2)

## 2023-09-30 LAB — URINALYSIS, ROUTINE W REFLEX MICROSCOPIC
Bacteria, UA: NONE SEEN
Bilirubin Urine: NEGATIVE
Glucose, UA: NEGATIVE mg/dL
Ketones, ur: NEGATIVE mg/dL
Nitrite: NEGATIVE
Protein, ur: 30 mg/dL — AB
Specific Gravity, Urine: 1.014 (ref 1.005–1.030)
pH: 7 (ref 5.0–8.0)

## 2023-09-30 LAB — RAPID URINE DRUG SCREEN, HOSP PERFORMED
Amphetamines: NOT DETECTED
Barbiturates: NOT DETECTED
Benzodiazepines: NOT DETECTED
Cocaine: POSITIVE — AB
Opiates: POSITIVE — AB
Tetrahydrocannabinol: NOT DETECTED

## 2023-09-30 LAB — URINALYSIS, W/ REFLEX TO CULTURE (INFECTION SUSPECTED)
Bacteria, UA: NONE SEEN
Bilirubin Urine: NEGATIVE
Glucose, UA: NEGATIVE mg/dL
Ketones, ur: 5 mg/dL — AB
Leukocytes,Ua: NEGATIVE
Nitrite: NEGATIVE
Protein, ur: NEGATIVE mg/dL
Specific Gravity, Urine: 1.027 (ref 1.005–1.030)
pH: 8 (ref 5.0–8.0)

## 2023-09-30 LAB — LIPASE, BLOOD: Lipase: 32 U/L (ref 11–51)

## 2023-09-30 LAB — COMPREHENSIVE METABOLIC PANEL WITH GFR
ALT: 15 U/L (ref 0–44)
AST: 25 U/L (ref 15–41)
Albumin: 4.1 g/dL (ref 3.5–5.0)
Alkaline Phosphatase: 91 U/L (ref 38–126)
Anion gap: 12 (ref 5–15)
BUN: 10 mg/dL (ref 6–20)
CO2: 23 mmol/L (ref 22–32)
Calcium: 9.2 mg/dL (ref 8.9–10.3)
Chloride: 106 mmol/L (ref 98–111)
Creatinine, Ser: 0.85 mg/dL (ref 0.61–1.24)
GFR, Estimated: >60 mL/min (ref >60)
Glucose, Bld: 117 mg/dL — ABNORMAL HIGH (ref 70–99)
Potassium: 4 mmol/L (ref 3.5–5.1)
Sodium: 141 mmol/L (ref 135–145)
Total Bilirubin: 0.6 mg/dL (ref 0.0–1.2)
Total Protein: 8 g/dL (ref 6.5–8.1)

## 2023-09-30 LAB — CREATININE, SERUM
Creatinine, Ser: 0.76 mg/dL (ref 0.61–1.24)
GFR, Estimated: >60 mL/min (ref >60)

## 2023-09-30 LAB — LACTIC ACID, PLASMA: Lactic Acid, Venous: 2.8 mmol/L (ref 0.5–1.9)

## 2023-09-30 MED ORDER — LORAZEPAM 1 MG PO TABS: 1.0000 mg | ORAL_TABLET | ORAL | Status: AC | PRN

## 2023-09-30 MED ORDER — ONDANSETRON HCL 4 MG/2ML IJ SOLN
4.0000 mg | Freq: Four times a day (QID) | INTRAMUSCULAR | Status: DC | PRN
  Administered 2023-10-02 (×2): 4 mg via INTRAVENOUS
  Filled 2023-09-30 (×2): qty 2

## 2023-09-30 MED ORDER — IOHEXOL 350 MG/ML SOLN
100.0000 mL | Freq: Once | INTRAVENOUS | Status: AC | PRN
  Administered 2023-09-30: 100 mL via INTRAVENOUS

## 2023-09-30 MED ORDER — LACTATED RINGERS IV BOLUS
500.0000 mL | Freq: Once | INTRAVENOUS | Status: AC
  Administered 2023-09-30: 500 mL via INTRAVENOUS

## 2023-09-30 MED ORDER — KETOROLAC TROMETHAMINE 15 MG/ML IJ SOLN
15.0000 mg | Freq: Once | INTRAMUSCULAR | Status: AC
  Administered 2023-09-30: 15 mg via INTRAVENOUS
  Filled 2023-09-30: qty 1

## 2023-09-30 MED ORDER — ADULT MULTIVITAMIN W/MINERALS CH
1.0000 | ORAL_TABLET | Freq: Every day | ORAL | Status: DC
  Administered 2023-09-30 – 2023-10-04 (×5): 1 via ORAL
  Filled 2023-09-30 (×5): qty 1

## 2023-09-30 MED ORDER — MORPHINE SULFATE (PF) 4 MG/ML IV SOLN
4.0000 mg | Freq: Once | INTRAVENOUS | Status: AC
  Administered 2023-09-30: 4 mg via INTRAVENOUS
  Filled 2023-09-30: qty 1

## 2023-09-30 MED ORDER — SODIUM CHLORIDE 0.9 % IV SOLN
1.0000 g | Freq: Once | INTRAVENOUS | Status: AC
  Administered 2023-09-30: 1 g via INTRAVENOUS
  Filled 2023-09-30: qty 10

## 2023-09-30 MED ORDER — PANTOPRAZOLE SODIUM 40 MG IV SOLR
40.0000 mg | Freq: Once | INTRAVENOUS | Status: AC
  Administered 2023-09-30: 40 mg via INTRAVENOUS
  Filled 2023-09-30: qty 10

## 2023-09-30 MED ORDER — NICOTINE 14 MG/24HR TD PT24
14.0000 mg | MEDICATED_PATCH | Freq: Every day | TRANSDERMAL | Status: DC
  Administered 2023-09-30 – 2023-10-02 (×3): 14 mg via TRANSDERMAL
  Filled 2023-09-30 (×5): qty 1

## 2023-09-30 MED ORDER — LACTATED RINGERS IV SOLN: INTRAVENOUS | Status: DC

## 2023-09-30 MED ORDER — ACETAMINOPHEN 650 MG RE SUPP: 650.0000 mg | Freq: Four times a day (QID) | RECTAL | Status: DC | PRN

## 2023-09-30 MED ORDER — THIAMINE MONONITRATE 100 MG PO TABS
100.0000 mg | ORAL_TABLET | Freq: Every day | ORAL | Status: DC
  Administered 2023-09-30 – 2023-10-04 (×5): 100 mg via ORAL
  Filled 2023-09-30 (×5): qty 1

## 2023-09-30 MED ORDER — ACETAMINOPHEN 325 MG PO TABS
650.0000 mg | ORAL_TABLET | Freq: Four times a day (QID) | ORAL | Status: DC | PRN
  Administered 2023-10-03: 650 mg via ORAL
  Filled 2023-09-30: qty 2

## 2023-09-30 MED ORDER — SODIUM CHLORIDE 0.9 % IV BOLUS
1000.0000 mL | Freq: Once | INTRAVENOUS | Status: AC
  Administered 2023-09-30: 1000 mL via INTRAVENOUS

## 2023-09-30 MED ORDER — HYDRALAZINE HCL 20 MG/ML IJ SOLN: 10.0000 mg | Freq: Four times a day (QID) | INTRAMUSCULAR | Status: DC | PRN

## 2023-09-30 MED ORDER — IOHEXOL 300 MG/ML  SOLN
100.0000 mL | Freq: Once | INTRAMUSCULAR | Status: AC | PRN
  Administered 2023-09-30: 100 mL via INTRAVENOUS

## 2023-09-30 MED ORDER — SODIUM CHLORIDE 0.9 % IV SOLN
25.0000 mg | Freq: Once | INTRAVENOUS | Status: AC
  Administered 2023-09-30: 25 mg via INTRAVENOUS
  Filled 2023-09-30: qty 25

## 2023-09-30 MED ORDER — PANTOPRAZOLE SODIUM 40 MG IV SOLR
40.0000 mg | Freq: Two times a day (BID) | INTRAVENOUS | Status: DC
  Administered 2023-10-01 – 2023-10-03 (×5): 40 mg via INTRAVENOUS
  Filled 2023-09-30 (×5): qty 10

## 2023-09-30 MED ORDER — SODIUM CHLORIDE 0.9 % IV SOLN
25.0000 mg | Freq: Four times a day (QID) | INTRAVENOUS | Status: DC | PRN
  Administered 2023-10-02 – 2023-10-03 (×2): 25 mg via INTRAVENOUS
  Filled 2023-09-30: qty 25
  Filled 2023-09-30: qty 1

## 2023-09-30 MED ORDER — ONDANSETRON HCL 4 MG/2ML IJ SOLN
4.0000 mg | Freq: Once | INTRAMUSCULAR | Status: AC
  Administered 2023-09-30: 4 mg via INTRAVENOUS
  Filled 2023-09-30: qty 2

## 2023-09-30 MED ORDER — FOLIC ACID 1 MG PO TABS
1.0000 mg | ORAL_TABLET | Freq: Every day | ORAL | Status: DC
  Administered 2023-09-30 – 2023-10-04 (×5): 1 mg via ORAL
  Filled 2023-09-30 (×5): qty 1

## 2023-09-30 MED ORDER — LORAZEPAM 2 MG/ML IJ SOLN
1.0000 mg | INTRAMUSCULAR | Status: AC | PRN
  Administered 2023-10-02: 1 mg via INTRAVENOUS
  Filled 2023-09-30: qty 1

## 2023-09-30 MED ORDER — ENOXAPARIN SODIUM 40 MG/0.4ML IJ SOSY
40.0000 mg | PREFILLED_SYRINGE | INTRAMUSCULAR | Status: DC
  Administered 2023-09-30 – 2023-10-02 (×3): 40 mg via SUBCUTANEOUS
  Filled 2023-09-30 (×4): qty 0.4

## 2023-09-30 MED ORDER — THIAMINE HCL 100 MG/ML IJ SOLN
100.0000 mg | Freq: Every day | INTRAMUSCULAR | Status: DC
  Filled 2023-09-30: qty 2

## 2023-09-30 MED ORDER — CLONIDINE HCL 0.1 MG/24HR TD PTWK
0.1000 mg | MEDICATED_PATCH | TRANSDERMAL | Status: DC
  Administered 2023-09-30: 0.1 mg via TRANSDERMAL
  Filled 2023-09-30: qty 1

## 2023-09-30 MED ORDER — ONDANSETRON HCL 4 MG PO TABS
4.0000 mg | ORAL_TABLET | Freq: Four times a day (QID) | ORAL | Status: DC | PRN
Start: 2023-09-30 — End: 2023-10-04

## 2023-09-30 MED ORDER — SODIUM CHLORIDE 0.9 % IV SOLN: INTRAVENOUS | Status: DC

## 2023-09-30 NOTE — Progress Notes (Signed)
 Date and time results received: 09/30/23 2149 (use smartphrase .now to insert current time)  Test: other chem Critical Value: Lactic acid = 2.8  Name of Provider Notified: MD on call  Orders Received? Or Actions Taken?: 2150waiting for orders

## 2023-09-30 NOTE — ED Triage Notes (Signed)
 Patient has generalized abdominal pain that hurts all over. Pain began this morning. Feels like he is going to have diarrhea.

## 2023-09-30 NOTE — Plan of Care (Signed)
  Problem: Education: Goal: Knowledge of General Education information will improve Description: Including pain rating scale, medication(s)/side effects and non-pharmacologic comfort measures Outcome: Progressing   Problem: Health Behavior/Discharge Planning: Goal: Ability to manage health-related needs will improve Outcome: Progressing   Problem: Clinical Measurements: Goal: Will remain free from infection Outcome: Progressing   Problem: Coping: Goal: Level of anxiety will decrease Outcome: Progressing   Problem: Elimination: Goal: Will not experience complications related to bowel motility Outcome: Progressing   Problem: Pain Managment: Goal: General experience of comfort will improve and/or be controlled Outcome: Progressing   Problem: Safety: Goal: Ability to remain free from injury will improve Outcome: Progressing   Problem: Skin Integrity: Goal: Risk for impaired skin integrity will decrease Outcome: Progressing   Problem: Urinary Elimination: Goal: Signs and symptoms of infection will decrease Outcome: Progressing

## 2023-09-30 NOTE — ED Provider Notes (Signed)
 [place] EMERGENCY DEPARTMENT AT [facility] Provider Note   CSN: [id number] Arrival date & time: 09/30/23  9562     Patient presents with: Abdominal Pain   Daniel [name] is a 58 y.o. male.  {Add pertinent medical, surgical, social history, OB history to HPI:32947}  Abdominal Pain    Patient has a history of hypertension chronic gastritis, pyelonephritis, alcohol abuse, intractable vomiting.  Patient presents to the ED for evaluation of diffuse abdominal pain.  Patient states he started having symptoms this morning.  He is having pain throughout his entire abdomen.  Patient states he has had this problem in the past when he does not take his medications.  Patient denies any regular alcohol use.  He denies any fevers.  No dysuria.  He has not had diarrhea but feels like he might have to.  Prior to Admission medications   Medication Sig Start Date End Date Taking? Authorizing Provider  acetaminophen -codeine  (TYLENOL  #3) 300-30 MG tablet Take 1 tablet by mouth every 8 (eight) hours as needed for moderate pain (pain score 4-6). Use sparingly 07/17/23   Hassie Lint, PA-C  acetaminophen -codeine  (TYLENOL  #3) 300-30 MG tablet Take 1 tablet by mouth every 8 (eight) hours as needed for moderate pain (pain score 4-6). Use sparingly. 07/17/23   Hassie Lint, PA-C  amLODipine  (NORVASC ) 5 MG tablet Take 1 tablet (5 mg total) by mouth daily. 03/18/23   Mayers, Cari S, PA-C  cephALEXin  (KEFLEX ) 500 MG capsule Take 1 capsule (500 mg total) by mouth 2 (two) times daily. 08/21/23   Henderly, Britni A, PA-C  ibuprofen  (ADVIL ) 800 MG tablet Take 1 (ONE) tablet (800 mg total) by mouth every 6 (six) hours if needed for mild pain or moderate pain for up to 4 days. 07/23/23     naproxen  (NAPROSYN ) 500 MG tablet Take 1 tablet (500 mg total) by mouth 2 (two) times daily. 08/21/23   Henderly, Britni A, PA-C  oxyCODONE -acetaminophen  (PERCOCET/ROXICET) 5-325 MG tablet Take 1-2 tablets by mouth every 6  (six) hours as needed for severe pain (pain score 7-10). 08/22/23   Sherel Dikes, PA-C  pantoprazole  (PROTONIX ) 40 MG tablet Take 1 tablet (40 mg total) by mouth daily. 03/18/23 03/17/24  Mayers, Cari S, PA-C  tamsulosin  (FLOMAX ) 0.4 MG CAPS capsule Take 1 capsule (0.4 mg total) by mouth daily. 03/18/23   Mayers, Cari S, PA-C    Allergies: Patient has no known allergies.    Review of Systems  Gastrointestinal:  Positive for abdominal pain.    Updated Vital Signs BP (!) 153/108 (BP Location: Left Arm)   Pulse 84   Temp 98.3 F (36.8 C) (Oral)   Resp 17   Ht 1.753 m (5' 9)   Wt 63.5 kg   SpO2 100%   BMI 20.67 kg/m   Physical Exam Vitals and nursing note reviewed.  Constitutional:      General: He is in acute distress.     Appearance: He is well-developed. He is ill-appearing.  HENT:     Head: Normocephalic and atraumatic.     Right Ear: External ear normal.     Left Ear: External ear normal.   Eyes:     General: No scleral icterus.       Right eye: No discharge.        Left eye: No discharge.     Conjunctiva/sclera: Conjunctivae normal.   Neck:     Trachea: No tracheal deviation.   Cardiovascular:  Rate and Rhythm: Normal rate and regular rhythm.  Pulmonary:     Effort: Pulmonary effort is normal. No respiratory distress.     Breath sounds: Normal breath sounds. No stridor. No wheezing or rales.  Abdominal:     General: Bowel sounds are normal. There is no distension.     Palpations: Abdomen is soft.     Tenderness: There is generalized abdominal tenderness. There is no guarding or rebound.   Musculoskeletal:        General: No tenderness or deformity.     Cervical back: Neck supple.   Skin:    General: Skin is warm and dry.     Findings: No rash.   Neurological:     General: No focal deficit present.     Mental Status: He is alert.     Cranial Nerves: No cranial nerve deficit, dysarthria or facial asymmetry.     Sensory: No sensory deficit.      Motor: No abnormal muscle tone or seizure activity.     Coordination: Coordination normal.   Psychiatric:        Mood and Affect: Mood normal.     (all labs ordered are listed, but only abnormal results are displayed) Labs Reviewed  COMPREHENSIVE METABOLIC PANEL WITH GFR - Abnormal; Notable for the following components:      Result Value   Glucose, Bld 117 (*)    All other components within normal limits  URINALYSIS, ROUTINE W REFLEX MICROSCOPIC - Abnormal; Notable for the following components:   APPearance CLOUDY (*)    Hgb urine dipstick SMALL (*)    Protein, ur 30 (*)    Leukocytes,Ua SMALL (*)    All other components within normal limits  LIPASE, BLOOD  CBC  URINALYSIS, W/ REFLEX TO CULTURE (INFECTION SUSPECTED)    EKG: None  Radiology: CT ABDOMEN PELVIS W CONTRAST Result Date: 09/30/2023 CLINICAL DATA:  Abdominal pain acute nonlocalized EXAM: CT ABDOMEN AND PELVIS WITHOUT CONTRAST TECHNIQUE: Multidetector CT imaging of the abdomen and pelvis was performed following the standard protocol without IV contrast. RADIATION DOSE REDUCTION: This exam was performed according to the departmental dose-optimization program which includes automated exposure control, adjustment of the mA and/or kV according to patient size and/or use of iterative reconstruction technique. COMPARISON:  May seventh 2025 FINDINGS: Lower chest: No infiltrates or consolidations, no pleural effusions Hepatobiliary: Liver normal size no masses no biliary dilatation. Gallbladder unremarkable. No gallstones. Comparison with prior examination there is a hypodense bilobed cystic appearing structure which projects posterior and medial to the gallbladder. Measures 3.7 x 2.8 by 1.8 cm in the longitudinal, transverse and AP diameter and appears to have some subtle perhaps calcifications of the walls. This appears more prominent on today's CT than on the prior studies. A communication of the density with a gallbladder is  difficult to establish on the CT images. I do not believe that this represents the gallbladder and I believe that this represents a cystic structure that could be related to the common bile duct and noted to exclude the possibility of a choledochal cyst I would like to recommend a follow-up ultrasound and if ultrasound can not characterize this lesions consider an MRCP. Pancreas: Pancreas normal size. No masses calcifications or inflammatory changes. Spleen: Spleen normal size.  No masses. Adrenals/Urinary Tract: Adrenal glands are normal size. Follow-up recommended. Kidneys are normal. No renal masses Comparison with prior examination accounting for differences in techniques no significant change in the calcifications within the lower pole calices  and infundibulum of the right kidney consistent with right nephrolithiasis with minimal dilatation of the right kidney lower pole calices similar to prior examination without evidence of dilatation of the renal pelvis or ureter. Stomach/Bowel: No small or large bowel obstruction or inflammatory changes. Moderate amount of residual fecal material throughout the colon without obstruction or constipation. Vascular/Lymphatic: No significant vascular findings are present. No enlarged abdominal or pelvic lymph nodes. Reproductive: .  No masses. Bladder unremarkable. Other: Anterior abdominal wall unremarkable without evidence of umbilical or inguinal hernias Musculoskeletal: Visualized portion of the thoracolumbar spine and pelvic structures grossly unremarkable without evidence of fracture bony abnormalities or soft tissue masses. IMPRESSION: *No acute findings in the abdomen or pelvis. *Right nephrolithiasis with minimal dilatation of the right kidney lower pole calices similar to prior examination without evidence of dilatation of the renal pelvis or ureter. *There is a hypodense bilobed cystic appearing structure which projects posterior and medial to the gallbladder. I do  not believe that this represents the gallbladder and I believe that this represents a cystic structure that could be related to the common bile duct and noted to exclude the possibility of a choledochal cyst. I would like to recommend a follow-up ultrasound and if ultrasound can not characterize this lesions consider an MRCP. Electronically Signed   By: Fredrich Jefferson M.D.   On: 09/30/2023 14:26    {Document cardiac monitor, telemetry assessment procedure when appropriate:32947} Procedures   Medications Ordered in the ED  cefTRIAXone  (ROCEPHIN ) 1 g in sodium chloride  0.9 % 100 mL IVPB (1 g Intravenous New Bag/Given 09/30/23 1439)  ketorolac  (TORADOL ) 15 MG/ML injection 15 mg (has no administration in time range)  morphine  (PF) 4 MG/ML injection 4 mg (has no administration in time range)  sodium chloride  0.9 % bolus 1,000 mL (0 mLs Intravenous Stopped 09/30/23 1438)  morphine  (PF) 4 MG/ML injection 4 mg (4 mg Intravenous Given 09/30/23 1142)  ondansetron  (ZOFRAN ) injection 4 mg (4 mg Intravenous Given 09/30/23 1143)  morphine  (PF) 4 MG/ML injection 4 mg (4 mg Intravenous Given 09/30/23 1311)  iohexol  (OMNIPAQUE ) 300 MG/ML solution 100 mL (100 mLs Intravenous Contrast Given 09/30/23 1347)    Clinical Course as of 09/30/23 1450  Mon Sep 30, 2023  1231 CBC CBC normal.  Metabolic panel normal.  Lipase normal. [JK]  1231 Urinalysis, Routine w reflex microscopic -Urine, Clean Catch(!) Urinalysis shows hematuria and increased WBCs. [JK]  1438 CT scan does not show any acute findings.  No definite ureteral stone.  Possible choledochal cyst noted [JK]    Clinical Course User Index [JK] Trish Furl, MD   {Click here for ABCD2, HEART and other calculators REFRESH Note before signing:1}                              Medical Decision Making Problems Addressed: Abdominal pain, unspecified abdominal location: acute illness or injury that poses a threat to life or bodily functions Acute cystitis with  hematuria: acute illness or injury that poses a threat to life or bodily functions  Amount and/or Complexity of Data Reviewed Labs: ordered. Decision-making details documented in ED Course. Radiology: ordered and independent interpretation performed.  Risk Prescription drug management.   Patient presented to the ED for evaluation of severe abdominal pain.  No signs of anemia or significant electrolyte abnormalities.  No findings of hepatitis or pancreatitis.  With the patient's severe abdominal pain requiring multiple doses of IV narcotics CT abdomen  pelvis was performed.  No acute abnormalities noted.  Possible choledochal cyst noted.  Patient's urinalysis does suggest infection.  Patient has continued to have severe pain requiring repeat pain medication.  Atypical presentation for pyelonephritis but will initiate antibiotics.  With his persistent pain we will consult the medical service for admission further treatment  {Document critical care time when appropriate  Document review of labs and clinical decision tools ie CHADS2VASC2, etc  Document your independent review of radiology images and any outside records  Document your discussion with family members, caretakers and with consultants  Document social determinants of health affecting pt's care  Document your decision making why or why not admission, treatments were needed:32947:::1}   Final diagnoses:  Acute cystitis with hematuria  Abdominal pain, unspecified abdominal location    ED Discharge Orders     None

## 2023-09-30 NOTE — Plan of Care (Signed)
  Problem: Nutrition: Goal: Adequate nutrition will be maintained Outcome: Progressing   Problem: Safety: Goal: Ability to remain free from injury will improve Outcome: Progressing   Problem: Pain Managment: Goal: General experience of comfort will improve and/or be controlled Outcome: Progressing

## 2023-09-30 NOTE — H&P (Addendum)
 History and Physical    Daniel [name]  UEA:[id number]  DOB: 1965-04-21  DOA: 09/30/2023 PCP: Daniel Mulberry, MD   Patient coming from: home  Chief Complaint: abdominal pain and intractable vomiting since 4 AM  HPI: Daniel [name] is a 58 y.o. male with medical history of chronic gastritis, alcohol use, nicotine use, marijuana use, anxiety and depression.  Difficult to obtain history because the patient is in a significant amount of pain and is not very forthcoming with information.  He states that he woke up at 4:00 this morning with abdominal pain.  The pain is throughout his mid abdomen.  It does not radiate to his back or up into his chest.  He states he has vomited about 10 times since 4 AM, nonbloody.  No diarrhea and no constipation.  No fevers or chills.  No dysuria, frequency of micturition, urgency or incontinence.    Has been out of his home medications for over a month.  ED Course: Received Morphine  4 g IV x 2, Protonix  40 mg IV once, Zofran  4 mg IV once,  NS 1 L once, Rocephin  1 gm IV  Review of Systems:  All other systems reviewed and apart from HPI, are negative.  Past Medical History:  Diagnosis Date   Anxiety    Chronic lower back pain    Depression    Internal hemorrhoids with other complication    Nephrolithiasis    Rectal abscess     Past Surgical History:  Procedure Laterality Date   HERNIA REPAIR     INCISION AND DRAINAGE PERIRECTAL ABSCESS  10/2010    Social History:  He smokes marijuana (1 joint) every 2 to 3 days.  He smokes cigarettes anywhere from 10-15 a day.  He denies cocaine use or any other drug use.   No Known Allergies  Family History  Problem Relation Age of Onset   HIV/AIDS Father    Stomach cancer Sister    Stomach cancer Brother      Prior to Admission medications   Medication Sig Start Date End Date Taking? Authorizing Provider  acetaminophen -codeine  (TYLENOL  #3) 300-30 MG tablet Take 1 tablet by mouth every 8 (eight) hours as  needed for moderate pain (pain score 4-6). Use sparingly 07/17/23   Hassie Lint, PA-C  acetaminophen -codeine  (TYLENOL  #3) 300-30 MG tablet Take 1 tablet by mouth every 8 (eight) hours as needed for moderate pain (pain score 4-6). Use sparingly. 07/17/23   Hassie Lint, PA-C  amLODipine  (NORVASC ) 5 MG tablet Take 1 tablet (5 mg total) by mouth daily. 03/18/23   Mayers, Cari S, PA-C  cephALEXin  (KEFLEX ) 500 MG capsule Take 1 capsule (500 mg total) by mouth 2 (two) times daily. 08/21/23   Henderly, Britni A, PA-C  ibuprofen  (ADVIL ) 800 MG tablet Take 1 (ONE) tablet (800 mg total) by mouth every 6 (six) hours if needed for mild pain or moderate pain for up to 4 days. 07/23/23     naproxen  (NAPROSYN ) 500 MG tablet Take 1 tablet (500 mg total) by mouth 2 (two) times daily. 08/21/23   Henderly, Britni A, PA-C  oxyCODONE -acetaminophen  (PERCOCET/ROXICET) 5-325 MG tablet Take 1-2 tablets by mouth every 6 (six) hours as needed for severe pain (pain score 7-10). 08/22/23   Sherel Dikes, PA-C  pantoprazole  (PROTONIX ) 40 MG tablet Take 1 tablet (40 mg total) by mouth daily. 03/18/23 03/17/24  Mayers, Cari S, PA-C  tamsulosin  (FLOMAX ) 0.4 MG CAPS capsule Take 1 capsule (0.4 mg total) by  mouth daily. 03/18/23   Mayers, Etter Hermann, PA-C    Physical Exam: Wt Readings from Last 3 Encounters:  09/30/23 63.5 kg  07/17/23 60.3 kg  07/16/23 60 kg   Vitals:   09/30/23 0956 09/30/23 0957 09/30/23 1305 09/30/23 1638  BP: (!) 173/96  (!) 153/108 (!) 184/88  Pulse: 77  84 (!) 58  Resp: 16  17 18   Temp: 98.1 F (36.7 C)  98.3 F (36.8 C) 98.2 F (36.8 C)  TempSrc: Oral  Oral   SpO2: 100%  100% 100%  Weight:  63.5 kg    Height:  5' 9 (1.753 m)        Constitutional: Very uncomfortable from pain- retching and gagging Eyes: PERRLA, lids and conjunctivae normal ENT:  Mucous membranes are dry Pharynx clear of exudate   Normal dentition.  Respiratory:  Clear to auscultation bilaterally  Normal respiratory effort.   Cardiovascular:  S1 & S2 heard, regular rate and rhythm No Murmurs Abdomen:  Non distended Severely tender in his mid abdomen with guarding No masses Bowel sounds normal Extremities:  No clubbing / cyanosis No pedal edema  Skin:  No rashes, lesions or ulcers Neurologic:  AAO x 3 CN 2-12 grossly intact Moves all 4 extremities Psychiatric:  Restless and anxious appearing    Labs on Admission: I have personally reviewed labs and imaging studies  Imaging: -CT abdomen pelvis was performed without contrast-reveals chronic right-sided nephrolithiasis with chronically minimally dilated right kidney lower pole calyces similar to prior exam and a hypodense bilobed cystic appearing structure posterior and medial to the gallbladder-further evaluation with ultrasound or MRCP is recommended  Assessment/Plan Principal Problem: Diffuse abdominal pain with intractable vomiting  H/o Chronic gastritis without bleeding -Severe diffuse abdominal pain and 10 episodes of vomiting since 4 AM  - stopped taking his Protonix  over a month ago - continues to drink heavily which may be exacerbating gastritis - he has required 12 mg of IV morphine  and 15 mg of IV Toradol , 4 mg IV Zofran  and 25 mg of IV Phenergan - still in pain with ongoing nausea - UDS is positive for cocaine (although he does not admit to using it)-may have vasoconstriction leading to ischemia-  -Bilobed cystic structure near his gallbladder is likely not causing diffuse acute pain throughout his abdomen -Plan:  - Obtain: CTA abd/pelvis & Lactic acid to rule out bowel vasoconstriction and bowel ischemia -  N.p.o. except sips with meds and ice chips - IV morphine , IV Zofran  and IV Phenergan  PRN - Avoid NSAIDs in case of acute gastritis - IV fluids > 125 cc/h for maintenance -MRCP of cystic structure near gallbladder can be ordered at a later time  Addendum: Lactic acid 2.8. CTA abd/pelvis negative. Will give another 500 cc bolus of  LR.   Active Problems:  Nonadherence to medications -Has not taken prescribed medications in over a month  Hypertensive urgency - BP is quite high at 184/88 - this is likely secondary to acute pain in setting of Cocaine use superimposed on chronic uncontrolled hypertension - Due to cocaine abuse, will avoid beta-blockers - He was previously prescribed Amlodipine  before he stopped taking his medications-due to persistent vomiting, unable to give oral medications today - Will temporarily start a clonidine patch which can be discontinued when vomiting resolves  - will order hydralazine  IV as needed - The main goal would be to help control his pain which will help bring his blood pressure down to a manageable level  Nephrolithiasis - Has  chronic right-sided nephrolithiasis which likely accounts for the RBCs noted on his UA - No acute flank pain to suggest that his nephrolithiasis is causing this current acute pain    Tobacco use -Nicotine patch ordered - Unable to do counseling due to severe pain currently    Alcohol abuse - States he drinks 40 ounces of beer at least every other day - CIWA scale ordered    Marijuana abuse  Urine drug screen positive for cocaine -Patient admits to marijuana use but did not admit to cocaine use - Unable to do counseling due to severe pain     DVT prophylaxis: Lovenox  Code Status: Full code Consults called: None Admission status: Observation Level of care: Med-Surg  Sedalia Dacosta MD [facility]    09/30/2023, 5:29 PM

## 2023-10-01 DIAGNOSIS — R109 Unspecified abdominal pain: Secondary | ICD-10-CM | POA: Diagnosis not present

## 2023-10-01 LAB — COMPREHENSIVE METABOLIC PANEL WITH GFR
ALT: 13 U/L (ref 0–44)
AST: 18 U/L (ref 15–41)
Albumin: 3.4 g/dL — ABNORMAL LOW (ref 3.5–5.0)
Alkaline Phosphatase: 73 U/L (ref 38–126)
Anion gap: 11 (ref 5–15)
BUN: 7 mg/dL (ref 6–20)
CO2: 22 mmol/L (ref 22–32)
Calcium: 8.8 mg/dL — ABNORMAL LOW (ref 8.9–10.3)
Chloride: 105 mmol/L (ref 98–111)
Creatinine, Ser: 0.81 mg/dL (ref 0.61–1.24)
GFR, Estimated: >60 mL/min (ref >60)
Glucose, Bld: 112 mg/dL — ABNORMAL HIGH (ref 70–99)
Potassium: 3.6 mmol/L (ref 3.5–5.1)
Sodium: 138 mmol/L (ref 135–145)
Total Bilirubin: 0.9 mg/dL (ref 0.0–1.2)
Total Protein: 6.9 g/dL (ref 6.5–8.1)

## 2023-10-01 LAB — CBC
HCT: 39.2 % (ref 39.0–52.0)
Hemoglobin: 12.8 g/dL — ABNORMAL LOW (ref 13.0–17.0)
MCH: 30.8 pg (ref 26.0–34.0)
MCHC: 32.7 g/dL (ref 30.0–36.0)
MCV: 94.5 fL (ref 80.0–100.0)
Platelets: 211 10*3/uL (ref 150–400)
RBC: 4.15 MIL/uL — ABNORMAL LOW (ref 4.22–5.81)
RDW: 13.5 % (ref 11.5–15.5)
WBC: 8.6 10*3/uL (ref 4.0–10.5)
nRBC: 0 % (ref 0.0–0.2)

## 2023-10-01 LAB — LACTIC ACID, PLASMA: Lactic Acid, Venous: 1 mmol/L (ref 0.5–1.9)

## 2023-10-01 LAB — HIV ANTIBODY (ROUTINE TESTING W REFLEX): HIV Screen 4th Generation wRfx: NONREACTIVE

## 2023-10-01 MED ORDER — TRAMADOL HCL 50 MG PO TABS
50.0000 mg | ORAL_TABLET | Freq: Four times a day (QID) | ORAL | Status: DC | PRN
  Administered 2023-10-02: 50 mg via ORAL
  Filled 2023-10-01 (×2): qty 1

## 2023-10-01 MED ORDER — LACTATED RINGERS IV SOLN: INTRAVENOUS | Status: DC

## 2023-10-01 MED ORDER — LACTATED RINGERS IV SOLN: INTRAVENOUS | Status: AC

## 2023-10-01 MED ORDER — KETOROLAC TROMETHAMINE 15 MG/ML IJ SOLN
15.0000 mg | Freq: Four times a day (QID) | INTRAMUSCULAR | Status: DC | PRN
  Administered 2023-10-01 – 2023-10-02 (×5): 15 mg via INTRAVENOUS
  Filled 2023-10-01 (×5): qty 1

## 2023-10-01 NOTE — TOC Initial Note (Signed)
 Transition of Care [facility]) - Initial/Assessment Note    Patient Details  Name: Daniel [name] MRN: [id number] Date of Birth: 06-29-1965  [facility] CM/SW Contact:    Marty Sleet, LCSW Phone Number: 10/01/2023, 3:02 PM  Clinical Narrative:                 Met with pt who shares he has been struggling with houselessness for the past 2 years. Pt has stayed in shelters, had been staying with niece for short time, and at times is able to stay with friends however, most of the time he is sleeping on the streets. Pt also has been staying in the pallet housing however, was kicked out due to a misunderstanding.  Pt is working with a group who is helping him apply for disability income. He reports this is his first time applying. He states he has paperwork he needs his PCP and therapist to complete however, has been unable to get in touch with them. Pt does not have the paperwork on him and states it is currently at his friends house. CSW requested for pt to attempt to obtain paperwork in order for CSW to assist with faxing it to the appropriate parties. Pt agreeable to this. CSW spoke with pt's therapist, Merline Starr [phone]) who shares she had not heard from him since he was incarcerated. She shares that pt can stop by to pick up needed paperwork or give her a call to schedule an appointment to go over any new paperwork. CSW scheduled a follow up PCP appointment for pt for 11/13/23 at 1:50pm. Pt can bring paperwork with him to this appointment.  CSW left VM with case manager at [employer] shelter to have pt placed on their wait list.  CSW discussed substance use. Pt admits to use however, states that rehab/recovery for use is on the bottom of his priority list at the moment. Pt agreeable to resources being placed on AVS. Pt would like to work on going to rehab in the future when he feels that his housing/financial situation are more stable.    Expected  Discharge Plan: Homeless Shelter Barriers to Discharge: No Barriers Identified   Patient Goals and CMS Choice Patient states their goals for this hospitalization and ongoing recovery are:: To find housing CMS Medicare.gov Compare Post Acute Care list provided to:: Patient Choice offered to / list presented to : Patient      Expected Discharge Plan and Services In-house Referral: Clinical Social Work Discharge Planning Services: NA Post Acute Care Choice: NA Living arrangements for the past 2 months: Homeless, No permanent address                 DME Arranged: N/A DME Agency: NA                  Prior Living Arrangements/Services Living arrangements for the past 2 months: Homeless, No permanent address Lives with:: Self, Friends Patient language and need for interpreter reviewed:: Yes Do you feel safe going back to the place where you live?: Yes      Need for Family Participation in Patient Care: No (Comment) Care giver support system in place?: No (comment)   Criminal Activity/Legal Involvement Pertinent to Current Situation/Hospitalization: No - Comment as needed  Activities of Daily Living      Permission Sought/Granted Permission sought to share information with : PCP Permission granted to share information with : Yes, Verbal Permission Granted  Permission granted to share info w AGENCY: PCP and therapist        Emotional Assessment Appearance:: Appears stated age Attitude/Demeanor/Rapport: Engaged Affect (typically observed): Accepting, Depressed Orientation: : Oriented to Self, Oriented to Place, Oriented to  Time, Oriented to Situation Alcohol / Substance Use: Illicit Drugs, Alcohol Use Psych Involvement: Outpatient Provider (Therapist at [facility])  Admission diagnosis:  Acute cystitis with hematuria [N30.01] Abdominal pain [R10.9] Abdominal pain, unspecified abdominal location [R10.9] Patient Active Problem List   Diagnosis Date Noted    Abdominal pain 09/30/2023   Marijuana abuse 09/30/2023   Alcohol abuse 03/04/2023   Intractable vomiting with nausea 11/19/2022   Acute pyelonephritis 03/14/2022   AKI (acute kidney injury) (HCC) 03/14/2022   Pyelonephritis 03/14/2022   Primary hypertension 12/20/2021   Chronic gastritis without bleeding 12/20/2021   Tobacco use 12/20/2021   PCP:  Joaquin Mulberry, MD Pharmacy:   [facility] [address] [place] [address] Phone: [phone] Fax: [phone]     Social Drivers of Health (SDOH) Social History: SDOH Screenings   Food Insecurity: No Food Insecurity (11/19/2022)  Housing: High Risk (11/19/2022)  Transportation Needs: No Transportation Needs (11/19/2022)  Utilities: At Risk (11/19/2022)  Depression (PHQ2-9): High Risk (07/17/2023)  Tobacco Use: High Risk (09/30/2023)   SDOH Interventions: Housing Interventions: Inpatient TOC, Intervention Not Indicated Utilities Interventions: Inpatient TOC, Intervention Not Indicated   Readmission Risk Interventions     No data to display

## 2023-10-01 NOTE — Progress Notes (Signed)
 PROGRESS NOTE    Daniel [name]  UJW:[id number] DOB: February 21, 1966 DOA: 09/30/2023 PCP: Joaquin Mulberry, MD   Brief Narrative:  58 year old male with history of chronic gastritis, alcohol use, nicotine use, marijuana use, anxiety and depression presented with severe abdominal pain with intractable vomiting.  On presentation, workup was essentially unremarkable with normal LFTs, renal function, white blood cell count, lipase.  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  CT angio of abdomen and pelvis was negative for any acute arterial abnormality.  He was started on IV fluids and Protonix .  Assessment & Plan:   Severe abdominal pain with intractable vomiting Possible acute on chronic gastritis without bleeding -Presented with severe diffuse abdominal pain and 10 episodes of vomiting acutely prior to presentation - Stopped taking his Protonix  over a month ago.  Continues to drink heavily which could be exacerbating gastritis. - UDS positive for cocaine -On presentation, workup was essentially unremarkable with normal LFTs, renal function, white blood cell count, lipase.  Imaging as above. - Currently on IV fluids and patient is NPO.  Pain slightly better and would like to try liquid diet.  Start clear liquid diet and advance to full liquid diet for dinner today.  Possibly, can advance to soft diet tomorrow and discharge home tomorrow if symptomatically improving -Will continue IV Protonix .  Add Maalox as needed.  Noncompliance to medications - Apparently has not taken prescribed medications in over a month  Hypertensive urgency - Blood pressure much improved.  DC clonidine.  Monitor blood pressure.  If blood pressure gets elevated again, will resume amlodipine  which patient was supposed to take at home.  Use IV  hydralazine  if needed  Nephrolithiasis -Has chronic right-sided nephrolithiasis.  Outpatient follow-up with urology.  Tobacco use -Continue nicotine patch.  Counseled regarding cessation  Marijuana abuse Urine drug screen positive for cocaine - Patient admitted to marijuana use but did not admit to cocaine use.  Counseled regarding cessation.  Consult TOC.  Lactic acidosis -Resolved.  No signs of infection.  Possibly from intractable vomiting and severe abdominal pain  Alcohol abuse -Apparently drinks 40 ounces of beer at least every other day.  Continue CIWA protocol.  Continue folic acid, thiamine and multivitamin.  TOC consult.     DVT prophylaxis: Lovenox  Code Status: Full Family Communication: None at bedside Disposition Plan: Status is: Observation The patient will require care spanning > 2 midnights and should be moved to inpatient because: Of severity of illness    Consultants: None  Procedures: None  Antimicrobials: None   Subjective: Patient seen and examined at bedside.  He is slightly better but still having intermittent abdominal pain and does not feel ready to go home today.  Wants to try some liquid food today.  No fever, chest pain, shortness of breath reported.  Objective: Vitals:   09/30/23 2204 10/01/23 0000 10/01/23 0447 10/01/23 0600  BP: 120/65 120/65 117/69 117/69  Pulse: 72 72 (!) 53 (!) 53  Resp: 18  18   Temp: 98.6 F (37 C)  98 F (36.7 C)   TempSrc: Oral  Oral   SpO2: 98%  100%   Weight:      Height:        Intake/Output Summary (Last 24 hours) at 10/01/2023 1006 Last data filed at 10/01/2023 0600 Gross per 24 hour  Intake 138.33 ml  Output 675 ml  Net -536.67 ml   Filed Weights   09/30/23 0957  Weight: 63.5 kg    Examination:  General exam: Appears calm and comfortable.  On room air. Respiratory system: Bilateral decreased breath sounds at bases, no wheezing Cardiovascular system: S1 & S2 heard, mild intermittent  bradycardia present gastrointestinal system: Abdomen is nondistended, soft and mildly tender in the epigastric region.  Normal bowel sounds heard. Extremities: No cyanosis, clubbing, edema  Central nervous system: Alert and oriented.  Slow to respond.  Poor historian.  No focal neurological deficits. Moving extremities Skin: No rashes, lesions or ulcers Psychiatry: Flat affect.  Not agitated.    Data Reviewed: I have personally reviewed following labs and imaging studies  CBC: Recent Labs  Lab 09/30/23 0959 09/30/23 2053 10/01/23 0016  WBC 9.2 7.6 8.6  HGB 13.9 14.5 12.8*  HCT 42.9 45.2 39.2  MCV 95.5 95.4 94.5  PLT 201 216 211   Basic Metabolic Panel: Recent Labs  Lab 09/30/23 0959 09/30/23 2053 10/01/23 0016  NA 141  --  138  K 4.0  --  3.6  CL 106  --  105  CO2 23  --  22  GLUCOSE 117*  --  112*  BUN 10  --  7  CREATININE 0.85 0.76 0.81  CALCIUM 9.2  --  8.8*   GFR: Estimated Creatinine Clearance: 91.5 mL/min (by C-G formula based on SCr of 0.81 mg/dL). Liver Function Tests: Recent Labs  Lab 09/30/23 0959 10/01/23 0016  AST 25 18  ALT 15 13  ALKPHOS 91 73  BILITOT 0.6 0.9  PROT 8.0 6.9  ALBUMIN 4.1 3.4*   Recent Labs  Lab 09/30/23 0959  LIPASE 32   No results for input(s): AMMONIA in the last 168 hours. Coagulation Profile: No results for input(s): INR, PROTIME in the last 168 hours. Cardiac Enzymes: No results for input(s): CKTOTAL, CKMB, CKMBINDEX, TROPONINI in the last 168 hours. BNP (last 3 results) No results for input(s): PROBNP in the last 8760 hours. HbA1C: No results for input(s): HGBA1C in the last 72 hours. CBG: No results for input(s): GLUCAP in the last 168 hours. Lipid Profile: No results for input(s): CHOL, HDL, LDLCALC, TRIG, CHOLHDL, LDLDIRECT in the last 72 hours. Thyroid  Function Tests: No results for input(s): TSH, T4TOTAL, FREET4, T3FREE, THYROIDAB in the last 72 hours. Anemia  Panel: No results for input(s): VITAMINB12, FOLATE, FERRITIN, TIBC, IRON, RETICCTPCT in the last 72 hours. Sepsis Labs: Recent Labs  Lab 09/30/23 2053 10/01/23 0016  LATICACIDVEN 2.8* 1.0    No results found for this or any previous visit (from the past 240 hours).       Radiology Studies: CT Angio Abd/Pel w/ and/or w/o Result Date: 09/30/2023 CLINICAL DATA:  Mesenteric ischemia, acute concern for vasospasm/ischemia- please do multiphase angiogram with portal venous phase EXAM: CTA ABDOMEN AND PELVIS WITHOUT AND WITH CONTRAST TECHNIQUE: Multidetector CT imaging of the abdomen and pelvis was performed using the standard protocol during bolus administration of intravenous contrast. Multiplanar reconstructed images and MIPs were obtained and reviewed to evaluate the vascular anatomy. RADIATION DOSE REDUCTION: This exam was performed according to the departmental dose-optimization program which includes automated exposure control, adjustment of the mA and/or kV according to patient size and/or use of iterative reconstruction technique. CONTRAST:  OMNIPAQUE  IOHEXOL  350 MG/ML SOLN COMPARISON:  CT abdomen pelvis 09/30/2023, CT abdomen pelvis 11/19/2022 FINDINGS: VASCULAR Aorta: Mild atherosclerotic plaque. Normal caliber aorta without aneurysm, dissection, vasculitis or significant stenosis. Celiac: Patent without evidence of  aneurysm, dissection, vasculitis or significant stenosis. SMA: Patent without evidence of aneurysm, dissection, vasculitis or significant stenosis. Renals: Both renal arteries are patent without evidence of aneurysm, dissection, vasculitis, fibromuscular dysplasia or significant stenosis. IMA: Patent without evidence of aneurysm, dissection, vasculitis or significant stenosis. Inflow: Mild atherosclerotic plaque. Patent without evidence of aneurysm, dissection, vasculitis or significant stenosis. Proximal Outflow: Bilateral common femoral and visualized portions of  the superficial and profunda femoral arteries are patent without evidence of aneurysm, dissection, vasculitis or significant stenosis. Veins: No obvious venous abnormality within the limitations of this arterial phase study. Review of the MIP images confirms the above findings. NON-VASCULAR Lower chest: No acute abnormality. Hepatobiliary: No focal liver abnormality. Vicarious excretion of previously administered intravenous contrast within the gallbladder lumen. No gallstones, gallbladder wall thickening, or pericholecystic fluid. Similar-appearing bilobed fluid density lesion along the porta hepatic measuring 3.9 x 2.3 cm (5:77). No biliary dilatation. Pancreas: No focal lesion. Normal pancreatic contour. No surrounding inflammatory changes. No main pancreatic ductal dilatation. Spleen: Normal in size without focal abnormality. Adrenals/Urinary Tract: No adrenal nodule bilaterally. Bilateral kidneys enhance symmetrically. No hydronephrosis. No hydroureter. Right nephrolithiasis measuring up to 1 cm. No left nephrolithiasis. No ureterolithiasis bilaterally. The urinary bladder is unremarkable. No urothelial wall thickening and there are no filling defects in the opacified portions of the bilateral collecting systems or ureters. Stomach/Bowel: Stomach is within normal limits. No evidence of bowel wall thickening or dilatation. No pneumatosis. Appendix appears normal. Lymphatic: No lymphadenopathy. Reproductive: Prostate is unremarkable. Other: No intraperitoneal free fluid. No intraperitoneal free gas. No organized fluid collection. Musculoskeletal: No abdominal wall hernia or abnormality. No suspicious lytic or blastic osseous lesions. No acute displaced fracture. IMPRESSION: VASCULAR 1. No acute arterial abnormality. 2.  Aortic Atherosclerosis (ICD10-I70.0)- mild. NON-VASCULAR 1. Nonobstructive nephrolithiasis measuring up to 1 cm. 2. Question choledochal cyst.  Recommend outpatient MRCP. Electronically Signed    By: Morgane  Naveau M.D.   On: 09/30/2023 22:26   CT ABDOMEN PELVIS W CONTRAST Result Date: 09/30/2023 CLINICAL DATA:  Abdominal pain acute nonlocalized EXAM: CT ABDOMEN AND PELVIS WITHOUT CONTRAST TECHNIQUE: Multidetector CT imaging of the abdomen and pelvis was performed following the standard protocol without IV contrast. RADIATION DOSE REDUCTION: This exam was performed according to the departmental dose-optimization program which includes automated exposure control, adjustment of the mA and/or kV according to patient size and/or use of iterative reconstruction technique. COMPARISON:  May seventh 2025 FINDINGS: Lower chest: No infiltrates or consolidations, no pleural effusions Hepatobiliary: Liver normal size no masses no biliary dilatation. Gallbladder unremarkable. No gallstones. Comparison with prior examination there is a hypodense bilobed cystic appearing structure which projects posterior and medial to the gallbladder. Measures 3.7 x 2.8 by 1.8 cm in the longitudinal, transverse and AP diameter and appears to have some subtle perhaps calcifications of the walls. This appears more prominent on today's CT than on the prior studies. A communication of the density with a gallbladder is difficult to establish on the CT images. I do not believe that this represents the gallbladder and I believe that this represents a cystic structure that could be related to the common bile duct and noted to exclude the possibility of a choledochal cyst I would like to recommend a follow-up ultrasound and if ultrasound can not characterize this lesions consider an MRCP. Pancreas: Pancreas normal size. No masses calcifications or inflammatory changes. Spleen: Spleen normal size.  No masses. Adrenals/Urinary Tract: Adrenal glands are normal size. Follow-up recommended. Kidneys are normal. No renal masses Comparison  with prior examination accounting for differences in techniques no significant change in the calcifications  within the lower pole calices and infundibulum of the right kidney consistent with right nephrolithiasis with minimal dilatation of the right kidney lower pole calices similar to prior examination without evidence of dilatation of the renal pelvis or ureter. Stomach/Bowel: No small or large bowel obstruction or inflammatory changes. Moderate amount of residual fecal material throughout the colon without obstruction or constipation. Vascular/Lymphatic: No significant vascular findings are present. No enlarged abdominal or pelvic lymph nodes. Reproductive: .  No masses. Bladder unremarkable. Other: Anterior abdominal wall unremarkable without evidence of umbilical or inguinal hernias Musculoskeletal: Visualized portion of the thoracolumbar spine and pelvic structures grossly unremarkable without evidence of fracture bony abnormalities or soft tissue masses. IMPRESSION: *No acute findings in the abdomen or pelvis. *Right nephrolithiasis with minimal dilatation of the right kidney lower pole calices similar to prior examination without evidence of dilatation of the renal pelvis or ureter. *There is a hypodense bilobed cystic appearing structure which projects posterior and medial to the gallbladder. I do not believe that this represents the gallbladder and I believe that this represents a cystic structure that could be related to the common bile duct and noted to exclude the possibility of a choledochal cyst. I would like to recommend a follow-up ultrasound and if ultrasound can not characterize this lesions consider an MRCP. Electronically Signed   By: Fredrich Jefferson M.D.   On: 09/30/2023 14:26        Scheduled Meds:  cloNIDine  0.1 mg Transdermal Weekly   enoxaparin  (LOVENOX ) injection  40 mg Subcutaneous Q24H   folic acid  1 mg Oral Daily   multivitamin with minerals  1 tablet Oral Daily   nicotine  14 mg Transdermal Daily   pantoprazole  (PROTONIX ) IV  40 mg Intravenous Q12H   thiamine  100 mg Oral Daily    Or   thiamine  100 mg Intravenous Daily   Continuous Infusions:  lactated ringers  125 mL/hr at 10/01/23 0942   promethazine  (PHENERGAN ) injection (IM or IVPB)            Audria Leather, MD [facility] 10/01/2023, 10:06 AM

## 2023-10-01 NOTE — Plan of Care (Signed)
  Problem: Clinical Measurements: Goal: Will remain free from infection Outcome: Progressing   Problem: Nutrition: Goal: Adequate nutrition will be maintained Outcome: Progressing   Problem: Coping: Goal: Level of anxiety will decrease Outcome: Progressing   Problem: Elimination: Goal: Will not experience complications related to bowel motility Outcome: Progressing   Problem: Urinary Elimination: Goal: Signs and symptoms of infection will decrease Outcome: Progressing

## 2023-10-02 ENCOUNTER — Observation Stay (HOSPITAL_COMMUNITY): Payer: MEDICAID

## 2023-10-02 DIAGNOSIS — N200 Calculus of kidney: Secondary | ICD-10-CM | POA: Diagnosis present

## 2023-10-02 DIAGNOSIS — K2950 Unspecified chronic gastritis without bleeding: Secondary | ICD-10-CM | POA: Diagnosis not present

## 2023-10-02 DIAGNOSIS — R1084 Generalized abdominal pain: Secondary | ICD-10-CM | POA: Diagnosis not present

## 2023-10-02 DIAGNOSIS — R112 Nausea with vomiting, unspecified: Secondary | ICD-10-CM | POA: Diagnosis not present

## 2023-10-02 DIAGNOSIS — K2900 Acute gastritis without bleeding: Secondary | ICD-10-CM | POA: Diagnosis not present

## 2023-10-02 DIAGNOSIS — F1010 Alcohol abuse, uncomplicated: Secondary | ICD-10-CM | POA: Diagnosis present

## 2023-10-02 DIAGNOSIS — Z79899 Other long term (current) drug therapy: Secondary | ICD-10-CM | POA: Diagnosis not present

## 2023-10-02 DIAGNOSIS — Q444 Choledochal cyst: Secondary | ICD-10-CM | POA: Diagnosis not present

## 2023-10-02 DIAGNOSIS — I160 Hypertensive urgency: Secondary | ICD-10-CM | POA: Diagnosis present

## 2023-10-02 DIAGNOSIS — Z830 Family history of human immunodeficiency virus [HIV] disease: Secondary | ICD-10-CM | POA: Diagnosis not present

## 2023-10-02 DIAGNOSIS — Z91148 Patient's other noncompliance with medication regimen for other reason: Secondary | ICD-10-CM | POA: Diagnosis not present

## 2023-10-02 DIAGNOSIS — Z91018 Allergy to other foods: Secondary | ICD-10-CM | POA: Diagnosis not present

## 2023-10-02 DIAGNOSIS — I10 Essential (primary) hypertension: Secondary | ICD-10-CM | POA: Diagnosis not present

## 2023-10-02 DIAGNOSIS — F1410 Cocaine abuse, uncomplicated: Secondary | ICD-10-CM | POA: Diagnosis present

## 2023-10-02 DIAGNOSIS — I700 Atherosclerosis of aorta: Secondary | ICD-10-CM | POA: Diagnosis present

## 2023-10-02 DIAGNOSIS — Z800 Family history of malignant neoplasm of digestive organs: Secondary | ICD-10-CM | POA: Diagnosis not present

## 2023-10-02 DIAGNOSIS — F1210 Cannabis abuse, uncomplicated: Secondary | ICD-10-CM | POA: Diagnosis present

## 2023-10-02 DIAGNOSIS — E872 Acidosis, unspecified: Secondary | ICD-10-CM | POA: Diagnosis present

## 2023-10-02 DIAGNOSIS — N3001 Acute cystitis with hematuria: Secondary | ICD-10-CM | POA: Diagnosis present

## 2023-10-02 DIAGNOSIS — F17210 Nicotine dependence, cigarettes, uncomplicated: Secondary | ICD-10-CM | POA: Diagnosis present

## 2023-10-02 LAB — COMPREHENSIVE METABOLIC PANEL WITH GFR
ALT: 11 U/L (ref 0–44)
AST: 17 U/L (ref 15–41)
Albumin: 2.9 g/dL — ABNORMAL LOW (ref 3.5–5.0)
Alkaline Phosphatase: 65 U/L (ref 38–126)
Anion gap: 6 (ref 5–15)
BUN: 8 mg/dL (ref 6–20)
CO2: 25 mmol/L (ref 22–32)
Calcium: 8.4 mg/dL — ABNORMAL LOW (ref 8.9–10.3)
Chloride: 104 mmol/L (ref 98–111)
Creatinine, Ser: 0.71 mg/dL (ref 0.61–1.24)
GFR, Estimated: >60 mL/min (ref >60)
Glucose, Bld: 93 mg/dL (ref 70–99)
Potassium: 3.7 mmol/L (ref 3.5–5.1)
Sodium: 135 mmol/L (ref 135–145)
Total Bilirubin: 1 mg/dL (ref 0.0–1.2)
Total Protein: 6.3 g/dL — ABNORMAL LOW (ref 6.5–8.1)

## 2023-10-02 LAB — CBC WITH DIFFERENTIAL/PLATELET
Abs Immature Granulocytes: 0.01 10*3/uL (ref 0.00–0.07)
Basophils Absolute: 0 10*3/uL (ref 0.0–0.1)
Basophils Relative: 0 %
Eosinophils Absolute: 0.1 10*3/uL (ref 0.0–0.5)
Eosinophils Relative: 1 %
HCT: 36.5 % — ABNORMAL LOW (ref 39.0–52.0)
Hemoglobin: 11.7 g/dL — ABNORMAL LOW (ref 13.0–17.0)
Immature Granulocytes: 0 %
Lymphocytes Relative: 25 %
Lymphs Abs: 1.9 10*3/uL (ref 0.7–4.0)
MCH: 30.5 pg (ref 26.0–34.0)
MCHC: 32.1 g/dL (ref 30.0–36.0)
MCV: 95.1 fL (ref 80.0–100.0)
Monocytes Absolute: 0.5 10*3/uL (ref 0.1–1.0)
Monocytes Relative: 7 %
Neutro Abs: 5 10*3/uL (ref 1.7–7.7)
Neutrophils Relative %: 67 %
Platelets: 214 10*3/uL (ref 150–400)
RBC: 3.84 MIL/uL — ABNORMAL LOW (ref 4.22–5.81)
RDW: 13.6 % (ref 11.5–15.5)
WBC: 7.5 10*3/uL (ref 4.0–10.5)
nRBC: 0 % (ref 0.0–0.2)

## 2023-10-02 LAB — MAGNESIUM: Magnesium: 1.8 mg/dL (ref 1.7–2.4)

## 2023-10-02 MED ORDER — LACTATED RINGERS IV SOLN: INTRAVENOUS | Status: AC

## 2023-10-02 MED ORDER — DICYCLOMINE HCL 20 MG PO TABS
20.0000 mg | ORAL_TABLET | Freq: Three times a day (TID) | ORAL | Status: DC | PRN
  Administered 2023-10-03: 20 mg via ORAL
  Filled 2023-10-02 (×2): qty 1

## 2023-10-02 MED ORDER — METHOCARBAMOL 500 MG PO TABS
500.0000 mg | ORAL_TABLET | Freq: Four times a day (QID) | ORAL | Status: DC | PRN
  Administered 2023-10-02: 500 mg via ORAL
  Filled 2023-10-02: qty 1

## 2023-10-02 NOTE — Hospital Course (Signed)
 Daniel [name] is a 58 y.o. male with a history of chronic gastritis, alcohol use, nicotine use, marijuana use, anxiety, depression.  Patient presented secondary to abdominal pain with intractable vomiting.  Initial CT imaging without etiology for symptoms.  Patient manage supportively with IV fluids, Protonix , antiemetics, analgesics.

## 2023-10-02 NOTE — Plan of Care (Addendum)
 VSS. Patient given PRN Toradol  for pain. LR infusing at 100ml/hr. No acute events overnight.  Problem: Education: Goal: Knowledge of General Education information will improve Description: Including pain rating scale, medication(s)/side effects and non-pharmacologic comfort measures Outcome: Progressing   Problem: Health Behavior/Discharge Planning: Goal: Ability to manage health-related needs will improve Outcome: Progressing   Problem: Clinical Measurements: Goal: Ability to maintain clinical measurements within normal limits will improve Outcome: Progressing Goal: Will remain free from infection Outcome: Progressing   Problem: Pain Managment: Goal: General experience of comfort will improve and/or be controlled Outcome: Progressing   Problem: Safety: Goal: Ability to remain free from injury will improve Outcome: Progressing

## 2023-10-02 NOTE — Progress Notes (Addendum)
 Lipid  PROGRESS NOTE    Daniel [name]  ZOX:[id number] DOB: 12/11/1965 DOA: 09/30/2023 PCP: Joaquin Mulberry, MD   Brief Narrative: [name] is a 58 y.o. male with a history of chronic gastritis, alcohol use, nicotine use, marijuana use, anxiety, depression.  Patient presented secondary to abdominal pain with intractable vomiting.  Initial CT imaging without etiology for symptoms.  Patient manage supportively with IV fluids, Protonix , antiemetics, analgesics.   Assessment and Plan:  Abdominal pain Intractable nausea and vomiting CT imaging without obvious etiology. Possible gastritis in setting of alcohol abuse. Symptoms initially improved, however patient with worsening abdominal pain this morning with recurrent emesis. -Check abdominal x-ray -Resume IV fluids-Continue Phenergan  as needed -May need GI consult if symptoms continue/worsen  Severe asymptomatic hypertension Patient initially started on clonidine which is discontinued. -Continue Hydralazine  as needed  Nephrolithiasis Noted on CT imaging.  Tobacco use -Continue nicotine patch  Marijuana use Noted. THC not detected on urine drug screen.  Cocaine use Patient positive for cocaine on urine drug screen.  Lactic acidosis Resolved with IV fluids. CTA abdomen/pelvis does not suggest evidence of mesenteric ischemia.  Alcohol abuse -Continue CIWA   DVT prophylaxis: Lovenox  Code Status:   Code Status: Full Code Family Communication: Step brother at bedside Disposition Plan: Discharge home pending ability to consistent tolerate an oral diet and improvement of symptoms   Consultants:  None  Procedures:  None  Antimicrobials: None   Subjective: Patient was initially feeling well but symptoms of abdominal pain and nausea worsen after eating his breakfast this morning.  When I saw him, he had not yet vomited but had a significant urge to vomit.  Objective: BP (!) 179/91 (BP Location: Right Arm)   Pulse  70   Temp 98.7 F (37.1 C)   Resp 18   Ht 5' 9 (1.753 m)   Wt 60.1 kg   SpO2 100%   BMI 19.57 kg/m   Examination:  General exam: Appears uncomfortable in bed and pain Respiratory system: Clear to auscultation. Respiratory effort normal. Cardiovascular system: S1 & S2 heard, RRR. No murmurs, rubs, gallops or clicks. Gastrointestinal system: Abdomen is nondistended, soft and nontender. Normal bowel sounds heard. Central nervous system: Alert and oriented. No focal neurological deficits. Musculoskeletal: No edema. No calf tenderness Psychiatry: Judgement and insight appear normal. Mood & affect appropriate.    Data Reviewed: I have personally reviewed following labs and imaging studies  CBC Lab Results  Component Value Date   WBC 7.5 10/02/2023   RBC 3.84 (L) 10/02/2023   HGB 11.7 (L) 10/02/2023   HCT 36.5 (L) 10/02/2023   MCV 95.1 10/02/2023   MCH 30.5 10/02/2023   PLT 214 10/02/2023   MCHC 32.1 10/02/2023   RDW 13.6 10/02/2023   LYMPHSABS 1.9 10/02/2023   MONOABS 0.5 10/02/2023   EOSABS 0.1 10/02/2023   BASOSABS 0.0 10/02/2023     Last metabolic panel Lab Results  Component Value Date   NA 135 10/02/2023   K 3.7 10/02/2023   CL 104 10/02/2023   CO2 25 10/02/2023   BUN 8 10/02/2023   CREATININE 0.71 10/02/2023   GLUCOSE 93 10/02/2023   GFRNONAA >60 10/02/2023   GFRAA >60 12/18/2018   CALCIUM 8.4 (L) 10/02/2023   PROT 6.3 (L) 10/02/2023   ALBUMIN 2.9 (L) 10/02/2023   LABGLOB 3.4 12/20/2021   AGRATIO 1.4 12/20/2021   BILITOT 1.0 10/02/2023   ALKPHOS 65 10/02/2023   AST 17 10/02/2023   ALT 11 10/02/2023  ANIONGAP 6 10/02/2023    GFR: Estimated Creatinine Clearance: 87.6 mL/min (by C-G formula based on SCr of 0.71 mg/dL).  No results found for this or any previous visit (from the past 240 hours).    Radiology Studies: CT Angio Abd/Pel w/ and/or w/o Result Date: 09/30/2023 CLINICAL DATA:  Mesenteric ischemia, acute concern for vasospasm/ischemia-  please do multiphase angiogram with portal venous phase EXAM: CTA ABDOMEN AND PELVIS WITHOUT AND WITH CONTRAST TECHNIQUE: Multidetector CT imaging of the abdomen and pelvis was performed using the standard protocol during bolus administration of intravenous contrast. Multiplanar reconstructed images and MIPs were obtained and reviewed to evaluate the vascular anatomy. RADIATION DOSE REDUCTION: This exam was performed according to the departmental dose-optimization program which includes automated exposure control, adjustment of the mA and/or kV according to patient size and/or use of iterative reconstruction technique. CONTRAST:  OMNIPAQUE  IOHEXOL  350 MG/ML SOLN COMPARISON:  CT abdomen pelvis 09/30/2023, CT abdomen pelvis 11/19/2022 FINDINGS: VASCULAR Aorta: Mild atherosclerotic plaque. Normal caliber aorta without aneurysm, dissection, vasculitis or significant stenosis. Celiac: Patent without evidence of aneurysm, dissection, vasculitis or significant stenosis. SMA: Patent without evidence of aneurysm, dissection, vasculitis or significant stenosis. Renals: Both renal arteries are patent without evidence of aneurysm, dissection, vasculitis, fibromuscular dysplasia or significant stenosis. IMA: Patent without evidence of aneurysm, dissection, vasculitis or significant stenosis. Inflow: Mild atherosclerotic plaque. Patent without evidence of aneurysm, dissection, vasculitis or significant stenosis. Proximal Outflow: Bilateral common femoral and visualized portions of the superficial and profunda femoral arteries are patent without evidence of aneurysm, dissection, vasculitis or significant stenosis. Veins: No obvious venous abnormality within the limitations of this arterial phase study. Review of the MIP images confirms the above findings. NON-VASCULAR Lower chest: No acute abnormality. Hepatobiliary: No focal liver abnormality. Vicarious excretion of previously administered intravenous contrast within the  gallbladder lumen. No gallstones, gallbladder wall thickening, or pericholecystic fluid. Similar-appearing bilobed fluid density lesion along the porta hepatic measuring 3.9 x 2.3 cm (5:77). No biliary dilatation. Pancreas: No focal lesion. Normal pancreatic contour. No surrounding inflammatory changes. No main pancreatic ductal dilatation. Spleen: Normal in size without focal abnormality. Adrenals/Urinary Tract: No adrenal nodule bilaterally. Bilateral kidneys enhance symmetrically. No hydronephrosis. No hydroureter. Right nephrolithiasis measuring up to 1 cm. No left nephrolithiasis. No ureterolithiasis bilaterally. The urinary bladder is unremarkable. No urothelial wall thickening and there are no filling defects in the opacified portions of the bilateral collecting systems or ureters. Stomach/Bowel: Stomach is within normal limits. No evidence of bowel wall thickening or dilatation. No pneumatosis. Appendix appears normal. Lymphatic: No lymphadenopathy. Reproductive: Prostate is unremarkable. Other: No intraperitoneal free fluid. No intraperitoneal free gas. No organized fluid collection. Musculoskeletal: No abdominal wall hernia or abnormality. No suspicious lytic or blastic osseous lesions. No acute displaced fracture. IMPRESSION: VASCULAR 1. No acute arterial abnormality. 2.  Aortic Atherosclerosis (ICD10-I70.0)- mild. NON-VASCULAR 1. Nonobstructive nephrolithiasis measuring up to 1 cm. 2. Question choledochal cyst.  Recommend outpatient MRCP. Electronically Signed   By: Morgane  Naveau M.D.   On: 09/30/2023 22:26      LOS: 0 days    Aneita Keens, MD [facility] 10/02/2023, 2:27 PM   If 7PM-7AM, please contact night-coverage [email or web address]

## 2023-10-03 DIAGNOSIS — R1084 Generalized abdominal pain: Secondary | ICD-10-CM | POA: Diagnosis not present

## 2023-10-03 NOTE — Plan of Care (Signed)

## 2023-10-03 NOTE — Progress Notes (Addendum)
 Lipid  PROGRESS NOTE    Daniel [name]  NWG:[id number] DOB: 02-27-1966 DOA: 09/30/2023 PCP: Joaquin Mulberry, MD   Brief Narrative: Daniel [name] is a 58 y.o. male with a history of chronic gastritis, alcohol use, nicotine use, marijuana use, anxiety, depression.  Patient presented secondary to abdominal pain with intractable vomiting.  Initial CT imaging without etiology for symptoms.  Patient manage supportively with IV fluids, Protonix , antiemetics, analgesics.   Assessment and Plan:  Abdominal pain Intractable nausea and vomiting CT imaging without obvious etiology. Possible gastritis in setting of alcohol abuse. Symptoms initially improved, however patient with worsening abdominal pain this morning with recurrent emesis. Repeat abdominal x-ray negative for acute process. Unclear etiology at this time for recurrent symptoms. He has had some diarrhea, so it is possible this could be related to gastroenteritis, although CT imaging was not remarkable. -Watch for symptoms while on diet -If worsening symptoms, will likely consult GI -Check GI pathogen panel  Severe asymptomatic hypertension Patient initially started on clonidine which is discontinued. -Continue Hydralazine  as needed  Nephrolithiasis Noted on CT imaging.  Tobacco use -Continue nicotine patch  Marijuana use Noted. THC not detected on urine drug screen.  Cocaine use Patient positive for cocaine on urine drug screen.  Lactic acidosis Resolved with IV fluids. CTA abdomen/pelvis does not suggest evidence of mesenteric ischemia.  Alcohol abuse -Continue CIWA   DVT prophylaxis: Lovenox  Code Status:   Code Status: Full Code Family Communication: None at bedside Disposition Plan: Discharge home pending ability to consistent tolerate an oral diet and improvement of symptoms. Likely discharge in 1-2 days.   Consultants:  None  Procedures:  None  Antimicrobials: None   Subjective: No issues this morning,  but had emesis last night. Patient is concerned about his episodes.  Objective: BP 108/63 (BP Location: Right Arm)   Pulse 69   Temp 98.1 F (36.7 C)   Resp 18   Ht 5' 9 (1.753 m)   Wt 60.1 kg   SpO2 100%   BMI 19.57 kg/m   Examination:  General exam: Appears slightly anxious and comfortable Respiratory system: Clear to auscultation. Respiratory effort normal. Cardiovascular system: S1 & S2 heard, RRR. No murmurs, rubs, gallops or clicks. Gastrointestinal system: Abdomen is nondistended, soft and mildly tender. Normal bowel sounds heard. Central nervous system: Alert and oriented. No focal neurological deficits.   Data Reviewed: I have personally reviewed following labs and imaging studies  CBC Lab Results  Component Value Date   WBC 7.5 10/02/2023   RBC 3.84 (L) 10/02/2023   HGB 11.7 (L) 10/02/2023   HCT 36.5 (L) 10/02/2023   MCV 95.1 10/02/2023   MCH 30.5 10/02/2023   PLT 214 10/02/2023   MCHC 32.1 10/02/2023   RDW 13.6 10/02/2023   LYMPHSABS 1.9 10/02/2023   MONOABS 0.5 10/02/2023   EOSABS 0.1 10/02/2023   BASOSABS 0.0 10/02/2023     Last metabolic panel Lab Results  Component Value Date   NA 135 10/02/2023   K 3.7 10/02/2023   CL 104 10/02/2023   CO2 25 10/02/2023   BUN 8 10/02/2023   CREATININE 0.71 10/02/2023   GLUCOSE 93 10/02/2023   GFRNONAA >60 10/02/2023   GFRAA >60 12/18/2018   CALCIUM 8.4 (L) 10/02/2023   PROT 6.3 (L) 10/02/2023   ALBUMIN 2.9 (L) 10/02/2023   LABGLOB 3.4 12/20/2021   AGRATIO 1.4 12/20/2021   BILITOT 1.0 10/02/2023   ALKPHOS 65 10/02/2023   AST 17 10/02/2023   ALT 11 10/02/2023  ANIONGAP 6 10/02/2023    GFR: Estimated Creatinine Clearance: 87.6 mL/min (by C-G formula based on SCr of 0.71 mg/dL).  No results found for this or any previous visit (from the past 240 hours).    Radiology Studies: DG Abd Portable 1V Result Date: 10/02/2023 CLINICAL DATA:  Abdominal pain, nausea EXAM: PORTABLE ABDOMEN - 1 VIEW  COMPARISON:  09/30/2023 FINDINGS: Two supine frontal views of the abdomen and pelvis are obtained. Stable 2.1 cm right renal calculus. No bowel obstruction or ileus. No abdominal masses. Lung bases are clear. IMPRESSION: 1. Stable right nephrolithiasis. 2. No bowel obstruction or ileus. Electronically Signed   By: Bobbye Burrow M.D.   On: 10/02/2023 16:53      LOS: 1 day    Aneita Keens, MD [facility] 10/03/2023, 11:29 AM   If 7PM-7AM, please contact night-coverage [email or web address]

## 2023-10-04 NOTE — Discharge Summary (Signed)
 Physician Discharge Summary   Patient: Daniel [name] MRN: [id number] DOB: 1966/01/18  Admit date:     09/30/2023  Discharge date: 10/04/23  Discharge Physician: Aneita Keens, MD   PCP: Joaquin Mulberry, MD   Recommendations at discharge:  PCP visit for hospital follow-up MRCP for evaluation of choledochal cyst  Discharge Diagnoses: Principal Problem:   Abdominal pain Active Problems:   Primary hypertension   Chronic gastritis without bleeding   Tobacco use   Alcohol abuse   Marijuana abuse  Resolved Problems:   * No resolved hospital problems. *  Hospital Course: Daniel [name] is a 58 y.o. male with a history of chronic gastritis, alcohol use, nicotine use, marijuana use, anxiety, depression.  Patient presented secondary to abdominal pain with intractable vomiting.  Initial CT imaging without etiology for symptoms.  Patient manage supportively with IV fluids, Protonix , antiemetics, analgesics with eventual improvement in symptoms.  Patient tolerating diet prior to discharge.  Assessment and Plan:  Abdominal pain Intractable nausea and vomiting CT imaging without obvious etiology. Possible gastritis in setting of alcohol abuse. Symptoms initially improved, however patient with worsening abdominal pain this morning with recurrent emesis. Repeat abdominal x-ray negative for acute process. Unclear etiology at this time for recurrent symptoms. He has had some diarrhea, so it is possible this could be related to gastroenteritis, although CT imaging was not remarkable.  Patient was able to advance his diet successfully without recurrent symptoms.  GI pathogen panel was ordered but patient's diarrhea resolved.   Severe asymptomatic hypertension Patient initially started on clonidine which is discontinued.  Patient managed with hydralazine  as needed.  Patient can continue home amlodipine  on discharge.   Nephrolithiasis Noted on CT imaging.   Tobacco use Patient managed with nicotine  patch while admitted.   Marijuana use Noted. THC not detected on urine drug screen.   Cocaine use Patient positive for cocaine on urine drug screen.   Lactic acidosis Resolved with IV fluids. CTA abdomen/pelvis does not suggest evidence of mesenteric ischemia.   Alcohol abuse Patient managed via CIWA.  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  Aortic atherosclerosis Recommend fasting lipid panel for evaluation and consideration of statin therapy   Consultants: None Procedures performed: None Disposition: Home Diet recommendation: Regular diet   DISCHARGE MEDICATION: Allergies as of 10/04/2023       Reactions   Tomato Rash        Medication List     STOP taking these medications    acetaminophen -codeine  300-30 MG tablet Commonly known as: TYLENOL  #3   cephALEXin  500 MG capsule Commonly known as: KEFLEX    ibuprofen  800 MG tablet Commonly known as: ADVIL    naproxen  500 MG tablet Commonly known as: NAPROSYN    oxyCODONE -acetaminophen  5-325 MG tablet Commonly known as: PERCOCET/ROXICET       TAKE these medications    amLODipine  5 MG tablet Commonly known as: NORVASC  Take 1 tablet (5 mg total) by mouth daily.   pantoprazole  40 MG tablet Commonly known as: Protonix  Take 1 tablet (40 mg total) by mouth daily.   tamsulosin  0.4 MG Caps capsule Commonly known as: FLOMAX  Take 1 capsule (0.4 mg total) by mouth daily.        Follow-up Information     Joaquin Mulberry, MD Follow up on 11/13/2023.   Specialty: Family Medicine Why: You have an appointment with your primary care provider for Wednesday, 11/13/23 at 1:50pm. Please arrive 10 minutes early. Contact information: [address] [place]  [address] [place] [address] [phone]                Discharge Exam: BP 107/69 (BP Location: Right Arm)   Pulse (!) 51   Temp 98.2 F (36.8 C) (Oral)   Resp 18   Ht 5' 9 (1.753 m)   Wt 61.3 kg   SpO2 100%   BMI  19.97 kg/m   General exam: Appears calm and comfortable Respiratory system: Clear to auscultation. Respiratory effort normal. Cardiovascular system: S1 & S2 heard, RRR. No murmurs, rubs, gallops or clicks. Gastrointestinal system: Abdomen is nondistended, soft and nontender. Normal bowel sounds heard. Central nervous system: Alert and oriented. No focal neurological deficits. Musculoskeletal: No edema. No calf tenderness Psychiatry: Judgement and insight appear normal. Mood & affect appropriate.   Condition at discharge: stable  The results of significant diagnostics from this hospitalization (including imaging, microbiology, ancillary and laboratory) are listed below for reference.   Imaging Studies: DG Abd Portable 1V Result Date: 10/02/2023 CLINICAL DATA:  Abdominal pain, nausea EXAM: PORTABLE ABDOMEN - 1 VIEW COMPARISON:  09/30/2023 FINDINGS: Two supine frontal views of the abdomen and pelvis are obtained. Stable 2.1 cm right renal calculus. No bowel obstruction or ileus. No abdominal masses. Lung bases are clear. IMPRESSION: 1. Stable right nephrolithiasis. 2. No bowel obstruction or ileus. Electronically Signed   By: Bobbye Burrow M.D.   On: 10/02/2023 16:53   CT Angio Abd/Pel w/ and/or w/o Result Date: 09/30/2023 CLINICAL DATA:  Mesenteric ischemia, acute concern for vasospasm/ischemia- please do multiphase angiogram with portal venous phase EXAM: CTA ABDOMEN AND PELVIS WITHOUT AND WITH CONTRAST TECHNIQUE: Multidetector CT imaging of the abdomen and pelvis was performed using the standard protocol during bolus administration of intravenous contrast. Multiplanar reconstructed images and MIPs were obtained and reviewed to evaluate the vascular anatomy. RADIATION DOSE REDUCTION: This exam was performed according to the departmental dose-optimization program which includes automated exposure control, adjustment of the mA and/or kV according to patient size and/or use of iterative  reconstruction technique. CONTRAST:  OMNIPAQUE  IOHEXOL  350 MG/ML SOLN COMPARISON:  CT abdomen pelvis 09/30/2023, CT abdomen pelvis 11/19/2022 FINDINGS: VASCULAR Aorta: Mild atherosclerotic plaque. Normal caliber aorta without aneurysm, dissection, vasculitis or significant stenosis. Celiac: Patent without evidence of aneurysm, dissection, vasculitis or significant stenosis. SMA: Patent without evidence of aneurysm, dissection, vasculitis or significant stenosis. Renals: Both renal arteries are patent without evidence of aneurysm, dissection, vasculitis, fibromuscular dysplasia or significant stenosis. IMA: Patent without evidence of aneurysm, dissection, vasculitis or significant stenosis. Inflow: Mild atherosclerotic plaque. Patent without evidence of aneurysm, dissection, vasculitis or significant stenosis. Proximal Outflow: Bilateral common femoral and visualized portions of the superficial and profunda femoral arteries are patent without evidence of aneurysm, dissection, vasculitis or significant stenosis. Veins: No obvious venous abnormality within the limitations of this arterial phase study. Review of the MIP images confirms the above findings. NON-VASCULAR Lower chest: No acute abnormality. Hepatobiliary: No focal liver abnormality. Vicarious excretion of previously administered intravenous contrast within the gallbladder lumen. No gallstones, gallbladder wall thickening, or pericholecystic fluid. Similar-appearing bilobed fluid density lesion along the porta hepatic measuring 3.9 x 2.3 cm (5:77). No biliary dilatation. Pancreas: No focal lesion. Normal pancreatic contour. No surrounding inflammatory changes. No main pancreatic ductal dilatation. Spleen: Normal in size without focal abnormality. Adrenals/Urinary Tract: No adrenal nodule bilaterally. Bilateral kidneys enhance symmetrically. No hydronephrosis. No hydroureter. Right nephrolithiasis measuring up to 1 cm. No left nephrolithiasis. No  ureterolithiasis bilaterally. The urinary bladder is unremarkable. No urothelial  wall thickening and there are no filling defects in the opacified portions of the bilateral collecting systems or ureters. Stomach/Bowel: Stomach is within normal limits. No evidence of bowel wall thickening or dilatation. No pneumatosis. Appendix appears normal. Lymphatic: No lymphadenopathy. Reproductive: Prostate is unremarkable. Other: No intraperitoneal free fluid. No intraperitoneal free gas. No organized fluid collection. Musculoskeletal: No abdominal wall hernia or abnormality. No suspicious lytic or blastic osseous lesions. No acute displaced fracture. IMPRESSION: VASCULAR 1. No acute arterial abnormality. 2.  Aortic Atherosclerosis (ICD10-I70.0)- mild. NON-VASCULAR 1. Nonobstructive nephrolithiasis measuring up to 1 cm. 2. Question choledochal cyst.  Recommend outpatient MRCP. Electronically Signed   By: Morgane  Naveau M.D.   On: 09/30/2023 22:26   CT ABDOMEN PELVIS W CONTRAST Result Date: 09/30/2023 CLINICAL DATA:  Abdominal pain acute nonlocalized EXAM: CT ABDOMEN AND PELVIS WITHOUT CONTRAST TECHNIQUE: Multidetector CT imaging of the abdomen and pelvis was performed following the standard protocol without IV contrast. RADIATION DOSE REDUCTION: This exam was performed according to the departmental dose-optimization program which includes automated exposure control, adjustment of the mA and/or kV according to patient size and/or use of iterative reconstruction technique. COMPARISON:  May seventh 2025 FINDINGS: Lower chest: No infiltrates or consolidations, no pleural effusions Hepatobiliary: Liver normal size no masses no biliary dilatation. Gallbladder unremarkable. No gallstones. Comparison with prior examination there is a hypodense bilobed cystic appearing structure which projects posterior and medial to the gallbladder. Measures 3.7 x 2.8 by 1.8 cm in the longitudinal, transverse and AP diameter and appears to have  some subtle perhaps calcifications of the walls. This appears more prominent on today's CT than on the prior studies. A communication of the density with a gallbladder is difficult to establish on the CT images. I do not believe that this represents the gallbladder and I believe that this represents a cystic structure that could be related to the common bile duct and noted to exclude the possibility of a choledochal cyst I would like to recommend a follow-up ultrasound and if ultrasound can not characterize this lesions consider an MRCP. Pancreas: Pancreas normal size. No masses calcifications or inflammatory changes. Spleen: Spleen normal size.  No masses. Adrenals/Urinary Tract: Adrenal glands are normal size. Follow-up recommended. Kidneys are normal. No renal masses Comparison with prior examination accounting for differences in techniques no significant change in the calcifications within the lower pole calices and infundibulum of the right kidney consistent with right nephrolithiasis with minimal dilatation of the right kidney lower pole calices similar to prior examination without evidence of dilatation of the renal pelvis or ureter. Stomach/Bowel: No small or large bowel obstruction or inflammatory changes. Moderate amount of residual fecal material throughout the colon without obstruction or constipation. Vascular/Lymphatic: No significant vascular findings are present. No enlarged abdominal or pelvic lymph nodes. Reproductive: .  No masses. Bladder unremarkable. Other: Anterior abdominal wall unremarkable without evidence of umbilical or inguinal hernias Musculoskeletal: Visualized portion of the thoracolumbar spine and pelvic structures grossly unremarkable without evidence of fracture bony abnormalities or soft tissue masses. IMPRESSION: *No acute findings in the abdomen or pelvis. *Right nephrolithiasis with minimal dilatation of the right kidney lower pole calices similar to prior examination without  evidence of dilatation of the renal pelvis or ureter. *There is a hypodense bilobed cystic appearing structure which projects posterior and medial to the gallbladder. I do not believe that this represents the gallbladder and I believe that this represents a cystic structure that could be related to the common bile duct and noted to  exclude the possibility of a choledochal cyst. I would like to recommend a follow-up ultrasound and if ultrasound can not characterize this lesions consider an MRCP. Electronically Signed   By: Fredrich Jefferson M.D.   On: 09/30/2023 14:26    Microbiology: Results for orders placed or performed during the hospital encounter of 03/14/22  Blood culture (routine x 2)     Status: None   Collection Time: 03/14/22 10:23 AM   Specimen: BLOOD RIGHT FOREARM  Result Value Ref Range Status   Specimen Description BLOOD RIGHT FOREARM  Final   Special Requests   Final    BOTTLES DRAWN AEROBIC AND ANAEROBIC Blood Culture adequate volume   Culture   Final    NO GROWTH 5 DAYS Performed at [facility], [address] [place], [place] [address]    Report Status 03/19/2022 FINAL  Final    Labs: CBC: Recent Labs  Lab 09/30/23 0959 09/30/23 2053 10/01/23 0016 10/02/23 0527  WBC 9.2 7.6 8.6 7.5  NEUTROABS  --   --   --  5.0  HGB 13.9 14.5 12.8* 11.7*  HCT 42.9 45.2 39.2 36.5*  MCV 95.5 95.4 94.5 95.1  PLT 201 216 211 214   Basic Metabolic Panel: Recent Labs  Lab 09/30/23 0959 09/30/23 2053 10/01/23 0016 10/02/23 0527  NA 141  --  138 135  K 4.0  --  3.6 3.7  CL 106  --  105 104  CO2 23  --  22 25  GLUCOSE 117*  --  112* 93  BUN 10  --  7 8  CREATININE 0.85 0.76 0.81 0.71  CALCIUM 9.2  --  8.8* 8.4*  MG  --   --   --  1.8   Liver Function Tests: Recent Labs  Lab 09/30/23 0959 10/01/23 0016 10/02/23 0527  AST 25 18 17   ALT 15 13 11   ALKPHOS 91 73 65  BILITOT 0.6 0.9 1.0  PROT 8.0 6.9 6.3*  ALBUMIN 4.1 3.4* 2.9*     Discharge time spent: 35  minutes.  Signed: Aneita Keens, MD [facility] 10/04/2023

## 2023-10-04 NOTE — Plan of Care (Signed)

## 2023-10-07 ENCOUNTER — Telehealth: Payer: Self-pay

## 2023-10-07 DIAGNOSIS — I10 Essential (primary) hypertension: Secondary | ICD-10-CM

## 2023-10-07 NOTE — Telephone Encounter (Signed)
 Copied from CRM [id number]. Topic: General - Other >> Oct 07, 2023 10:07 AM Tobias L wrote:  Reason for CRM: Pt returning Jane's call. Requesting call back [phone].

## 2023-10-07 NOTE — Transitions of Care (Post Inpatient/ED Visit) (Signed)
   10/07/2023  Name: Nesbit Michon Withrow MRN: [id number] DOB: 1965-05-06  Today's TOC FU Call Status: Today's TOC FU Call Status:: Unsuccessful Call (1st Attempt) Unsuccessful Call (1st Attempt) Date: 10/07/23  Attempted to reach the patient regarding the most recent Inpatient/ED visit.  Follow Up Plan: Additional outreach attempts will be made to reach the patient to complete the Transitions of Care (Post Inpatient/ED visit) call.   Signature  Slater Diesel, RN

## 2023-10-07 NOTE — Telephone Encounter (Signed)
 Call returned to patient and documented in another telephone encounter today

## 2023-10-07 NOTE — Transitions of Care (Post Inpatient/ED Visit) (Unsigned)
 10/07/2023  Name: Daniel [name] MRN: [id number] DOB: [birth year]  Today's TOC FU Call Status: Today's TOC FU Call Status:: Successful TOC FU Call Completed Unsuccessful Call (1st Attempt) Date: 10/07/23 [facility] FU Call Complete Date: 10/07/23 Patient's Name and Date of Birth confirmed.  Transition Care Management Follow-up Telephone Call Date of Discharge: 10/04/23 Discharge Facility: [facility]) Primary Inpatient Discharge Diagnosis:: abdominal pain How have you been since you were released from the hospital?: Same (He said he is not feeling any better because he needs papers filled out for disability.) Any questions or concerns?: Yes Patient Questions/Concerns:: He said he has papers that need to be completed for his disability.  That was the focus of his  conversation.  he explained that he really needs this paperwork completed.  I told him that I do not know what paperwork he is talking about.  he said he talked to Dr Brien about this and he understands his new PCP is Dr Delbert but he has not seen her yet.  I explained that she is not able to fill out paperwork for someone she has never seen. I told him that he can go to the [facility] today at [facility] and the provider on the bus can review it and decide if it is something she can complete or if he recommends he see Dr Delbert for this. He said he will try to get there today. He explained that Lauraine, his therapist, works at [facility] and he will text her to see if she is at [facility] today too.  I told him the hours of [facility] today and tomorrow they are at [employer].  He said he is going to try to get to [facility] today. Patient Questions/Concerns Addressed: Notified Provider of Patient Questions/Concerns  Items Reviewed: Did you receive and understand the discharge instructions provided?: Yes Medications obtained,verified, and reconciled?: Yes (Medications Reviewed) (He said he doesnt have any of his meds because they were stolen.  the phone connection was not  good but he was not focused on getting his medications, he wanted to discuss his disability paperwork) Any new allergies since your discharge?: No Dietary orders reviewed?: No Do you have support at home?: Yes People in Home [RPT]: child(ren), adult Name of Support/Comfort Primary Source: He said he has 3 sons here  Medications Reviewed Today: Medications Reviewed Today     Reviewed by Marvis Bradley, RN (Case Manager) on 10/07/23 at 1418  Med List Status: <None>   Medication Order Taking? Sig Documenting Provider Last Dose Status Informant  amLODipine  (NORVASC ) 5 MG tablet [id number] No Take 1 tablet (5 mg total) by mouth daily.  Patient not taking: Reported on 09/30/2023   Darcus Kirk RAMAN, PA-C Not Taking Active Self  pantoprazole  (PROTONIX ) 40 MG tablet [id number] No Take 1 tablet (40 mg total) by mouth daily.  Patient not taking: Reported on 09/30/2023   Darcus Kirk RAMAN, PA-C Not Taking Active Self  tamsulosin  (FLOMAX ) 0.4 MG CAPS capsule [id number] No Take 1 capsule (0.4 mg total) by mouth daily.  Patient not taking: Reported on 09/30/2023   Darcus Kirk RAMAN, PA-C Not Taking Active Self            Home Care and Equipment/Supplies: Were Home Health Services Ordered?: No Any new equipment or medical supplies ordered?: No  Functional Questionnaire: Do you need assistance with bathing/showering or dressing?: No Do you need assistance with meal preparation?: No Do you need assistance with eating?: No Do you have difficulty maintaining  continence: No Do you need assistance with getting out of bed/getting out of a chair/moving?: No Do you have difficulty managing or taking your medications?: No  Follow up appointments reviewed: PCP Follow-up appointment confirmed?: Yes Date of PCP follow-up appointment?: 10/24/23 Follow-up Provider: [facility] Follow-up appointment confirmed?: NA Do you need transportation to your follow-up appointment?: No Do you understand care  options if your condition(s) worsen?:  (Patient hung up before we discussed this.  The connection was poor despite calling him back after he initiallly answered.)    SIGNATURE  Slater Diesel, RN

## 2023-10-22 ENCOUNTER — Telehealth: Payer: Self-pay | Admitting: Family Medicine

## 2023-10-22 NOTE — Telephone Encounter (Signed)
 Contacted pt left vm to confirmed appt

## 2023-10-24 ENCOUNTER — Inpatient Hospital Stay: Payer: MEDICAID | Admitting: Family Medicine

## 2023-11-13 ENCOUNTER — Ambulatory Visit: Payer: MEDICAID | Admitting: Family Medicine

## 2023-12-23 ENCOUNTER — Other Ambulatory Visit: Payer: Self-pay

## 2023-12-23 ENCOUNTER — Emergency Department (HOSPITAL_COMMUNITY): Payer: MEDICAID

## 2023-12-23 ENCOUNTER — Emergency Department (HOSPITAL_COMMUNITY)
Admission: EM | Admit: 2023-12-23 | Discharge: 2023-12-23 | Disposition: A | Discharge status: Home / Self Care | Payer: MEDICAID | Attending: Emergency Medicine | Admitting: Emergency Medicine

## 2023-12-23 ENCOUNTER — Encounter (HOSPITAL_COMMUNITY): Payer: Self-pay

## 2023-12-23 DIAGNOSIS — R1110 Vomiting, unspecified: Secondary | ICD-10-CM | POA: Diagnosis not present

## 2023-12-23 DIAGNOSIS — Z5900 Homelessness unspecified: Secondary | ICD-10-CM | POA: Diagnosis not present

## 2023-12-23 DIAGNOSIS — R109 Unspecified abdominal pain: Secondary | ICD-10-CM | POA: Diagnosis present

## 2023-12-23 DIAGNOSIS — R1033 Periumbilical pain: Secondary | ICD-10-CM | POA: Diagnosis not present

## 2023-12-23 DIAGNOSIS — K529 Noninfective gastroenteritis and colitis, unspecified: Secondary | ICD-10-CM

## 2023-12-23 DIAGNOSIS — R739 Hyperglycemia, unspecified: Secondary | ICD-10-CM | POA: Diagnosis not present

## 2023-12-23 LAB — COMPREHENSIVE METABOLIC PANEL WITH GFR
ALT: 21 U/L (ref 0–44)
AST: 39 U/L (ref 15–41)
Albumin: 4.9 g/dL (ref 3.5–5.0)
Alkaline Phosphatase: 116 U/L (ref 38–126)
Anion gap: 17 — ABNORMAL HIGH (ref 5–15)
BUN: 7 mg/dL (ref 6–20)
CO2: 19 mmol/L — ABNORMAL LOW (ref 22–32)
Calcium: 10.3 mg/dL (ref 8.9–10.3)
Chloride: 102 mmol/L (ref 98–111)
Creatinine, Ser: 0.86 mg/dL (ref 0.61–1.24)
GFR, Estimated: >60 mL/min (ref >60)
Glucose, Bld: 116 mg/dL — ABNORMAL HIGH (ref 70–99)
Potassium: 5 mmol/L (ref 3.5–5.1)
Sodium: 138 mmol/L (ref 135–145)
Total Bilirubin: 0.9 mg/dL (ref 0.0–1.2)
Total Protein: 9.6 g/dL — ABNORMAL HIGH (ref 6.5–8.1)

## 2023-12-23 LAB — LIPASE, BLOOD: Lipase: 23 U/L (ref 11–51)

## 2023-12-23 LAB — CBC
HCT: 57.6 % — ABNORMAL HIGH (ref 39.0–52.0)
Hemoglobin: 17.8 g/dL — ABNORMAL HIGH (ref 13.0–17.0)
MCH: 30.2 pg (ref 26.0–34.0)
MCHC: 30.9 g/dL (ref 30.0–36.0)
MCV: 97.6 fL (ref 80.0–100.0)
Platelets: 183 K/uL (ref 150–400)
RBC: 5.9 MIL/uL — ABNORMAL HIGH (ref 4.22–5.81)
RDW: 12.2 % (ref 11.5–15.5)
WBC: 9.6 K/uL (ref 4.0–10.5)
nRBC: 0 % (ref 0.0–0.2)

## 2023-12-23 LAB — URINALYSIS, ROUTINE W REFLEX MICROSCOPIC
Bilirubin Urine: NEGATIVE
Glucose, UA: NEGATIVE mg/dL
Ketones, ur: 5 mg/dL — AB
Nitrite: NEGATIVE
Protein, ur: 100 mg/dL — AB
Specific Gravity, Urine: 1.016 (ref 1.005–1.030)
WBC, UA: >50 WBC/hpf (ref 0–5)
pH: 7 (ref 5.0–8.0)

## 2023-12-23 MED ORDER — IOHEXOL 300 MG/ML  SOLN
100.0000 mL | Freq: Once | INTRAMUSCULAR | Status: AC | PRN
  Administered 2023-12-23: 100 mL via INTRAVENOUS

## 2023-12-23 MED ORDER — ONDANSETRON HCL 4 MG/2ML IJ SOLN
4.0000 mg | Freq: Once | INTRAMUSCULAR | Status: AC
  Administered 2023-12-23: 4 mg via INTRAVENOUS
  Filled 2023-12-23: qty 2

## 2023-12-23 MED ORDER — ONDANSETRON 4 MG PO TBDP
4.0000 mg | ORAL_TABLET | Freq: Three times a day (TID) | ORAL | 0 refills | Status: DC | PRN
  Filled 2023-12-23: qty 20, 7d supply, fill #0

## 2023-12-23 MED ORDER — SODIUM CHLORIDE 0.9 % IV BOLUS
1000.0000 mL | Freq: Once | INTRAVENOUS | Status: AC
  Administered 2023-12-23: 1000 mL via INTRAVENOUS

## 2023-12-23 MED ORDER — MORPHINE SULFATE (PF) 4 MG/ML IV SOLN
4.0000 mg | Freq: Once | INTRAVENOUS | Status: AC
  Administered 2023-12-23: 4 mg via INTRAVENOUS
  Filled 2023-12-23: qty 1

## 2023-12-23 NOTE — Discharge Instructions (Signed)
 You were seen in the ER today for concerns of abdominal, nausea, and vomiting. Your labs and imaging were thankfully reassuring and I suspect you may have a stomach illness called gastroenteritis that is contagious. You responded well to medications here in the ER and I will be sending you home with nausea medication to continue using. Return to the ER for concerns of new or worsening symptoms.

## 2023-12-23 NOTE — ED Triage Notes (Signed)
 [facility] reports pt is homeless. C/o abdominal pain since 0300. One episode of vomiting with EMS.

## 2023-12-23 NOTE — ED Provider Notes (Signed)
 [place] EMERGENCY DEPARTMENT AT [facility] Provider Note   CSN: [id number] Arrival date & time: 12/23/23  1034     Patient presents with: Abdominal Pain and Emesis  HPI Daniel [name] is a 58 y.o. male with history of rectal abscess, recent diagnosis of nephrolithiasis presenting for abdominal pain with vomiting.  Started acutely at 3 AM this morning.  Pain is in the center of his abdomen.  Does report smoking marijuana at night before.  States he has a normal bowel movement yesterday.  Denies urinary symptoms.  Denies any pain rating to his back or his chest.    Abdominal Pain Associated symptoms: vomiting   Emesis Associated symptoms: abdominal pain        Prior to Admission medications   Medication Sig Start Date End Date Taking? Authorizing Provider  amLODipine  (NORVASC ) 5 MG tablet Take 1 tablet (5 mg total) by mouth daily. Patient not taking: Reported on 09/30/2023 03/18/23   Mayers, Kirk RAMAN, PA-C  pantoprazole  (PROTONIX ) 40 MG tablet Take 1 tablet (40 mg total) by mouth daily. Patient not taking: Reported on 09/30/2023 03/18/23 03/17/24  Mayers, Kirk RAMAN, PA-C  tamsulosin  (FLOMAX ) 0.4 MG CAPS capsule Take 1 capsule (0.4 mg total) by mouth daily. Patient not taking: Reported on 09/30/2023 03/18/23   Mayers, Kirk RAMAN, PA-C    Allergies: Tomato    Review of Systems  Gastrointestinal:  Positive for abdominal pain and vomiting.    Updated Vital Signs BP (!) 172/95 (BP Location: Left Arm)   Pulse 80   Temp 98.5 F (36.9 C)   Resp 19   SpO2 100%   Physical Exam Vitals and nursing note reviewed.  HENT:     Head: Normocephalic and atraumatic.     Mouth/Throat:     Mouth: Mucous membranes are moist.  Eyes:     General:        Right eye: No discharge.        Left eye: No discharge.     Conjunctiva/sclera: Conjunctivae normal.  Cardiovascular:     Rate and Rhythm: Normal rate and regular rhythm.     Pulses: Normal pulses.     Heart sounds: Normal heart  sounds.  Pulmonary:     Effort: Pulmonary effort is normal.     Breath sounds: Normal breath sounds.  Abdominal:     General: Abdomen is flat.     Palpations: Abdomen is soft.     Tenderness: There is abdominal tenderness in the periumbilical area.  Skin:    General: Skin is warm and dry.  Neurological:     General: No focal deficit present.  Psychiatric:        Mood and Affect: Mood normal.     (all labs ordered are listed, but only abnormal results are displayed) Labs Reviewed  COMPREHENSIVE METABOLIC PANEL WITH GFR - Abnormal; Notable for the following components:      Result Value   CO2 19 (*)    Glucose, Bld 116 (*)    Total Protein 9.6 (*)    Anion gap 17 (*)    All other components within normal limits  CBC - Abnormal; Notable for the following components:   RBC 5.90 (*)    Hemoglobin 17.8 (*)    HCT 57.6 (*)    All other components within normal limits  URINALYSIS, ROUTINE W REFLEX MICROSCOPIC - Abnormal; Notable for the following components:   APPearance CLOUDY (*)    Hgb urine dipstick SMALL (*)  Ketones, ur 5 (*)    Protein, ur 100 (*)    Leukocytes,Ua MODERATE (*)    Bacteria, UA RARE (*)    All other components within normal limits  LIPASE, BLOOD    EKG: None  Radiology: No results found.   Procedures   Medications Ordered in the ED  sodium chloride  0.9 % bolus 1,000 mL (has no administration in time range)  ondansetron  (ZOFRAN ) injection 4 mg (has no administration in time range)  morphine  (PF) 4 MG/ML injection 4 mg (has no administration in time range)                                    Medical Decision Making Amount and/or Complexity of Data Reviewed Labs: ordered. Radiology: ordered.  Risk Prescription drug management.   58 year old well-appearing male presenting for abdominal pain and vomiting.  Exam notable for periumbilical abdominal tenderness.  Per review of his chart he did have a recent diagnosis of a kidney stone.  Labs  showed hematuria, pyuria and bacteriuria, mild hyperglycemia and anion gap of 17.  Given abdominal tenderness on exam with concerning UA findings, felt it warranted further evaluation with CT.  That study is pending.  DDx includes appendicitis, acute cholecystitis, bowel obstruction, pyelonephritis, kidney stone, AKI, other.  Ordered morphine , Zofran  and normal saline bolus. Also could be CHS. Signed out to [employer], [place].       Final diagnoses:  Abdominal pain, unspecified abdominal location    ED Discharge Orders     None          Daniel [name] 12/23/23 1503    Dasie Faden, MD 12/25/23 1134

## 2023-12-23 NOTE — ED Provider Notes (Signed)
 Accepted handoff at shift change from [place], [place]. Please see prior provider note for more detail.   Briefly: Patient is 58 y.o.   DDX: concern for pyelonephritis, gastritis, gastritis, cannabinol hyperemesis syndrome  Plan: Disposition per reevaluation results of CT imaging of the abdomen and pelvis.  Physical Exam  BP (!) 174/78 (BP Location: Right Arm)   Pulse 71   Temp 98.5 F (36.9 C) (Oral)   Resp 16   SpO2 100%   Physical Exam Vitals and nursing note reviewed.  HENT:     Head: Normocephalic and atraumatic.     Mouth/Throat:     Mouth: Mucous membranes are moist.  Eyes:     General:        Right eye: No discharge.        Left eye: No discharge.     Conjunctiva/sclera: Conjunctivae normal.  Cardiovascular:     Rate and Rhythm: Normal rate and regular rhythm.     Pulses: Normal pulses.     Heart sounds: Normal heart sounds.  Pulmonary:     Effort: Pulmonary effort is normal.     Breath sounds: Normal breath sounds.  Abdominal:     General: Abdomen is flat.     Palpations: Abdomen is soft.     Tenderness: There is abdominal tenderness in the periumbilical area.  Skin:    General: Skin is warm and dry.  Neurological:     General: No focal deficit present.  Psychiatric:        Mood and Affect: Mood normal.     Procedures  Procedures  ED Course / MDM   Clinical Course as of 12/23/23 1749  Mon Dec 23, 2023  1503 N/V, pending CTAP and reevaluation. [OZ]    Clinical Course User Index [OZ] Cecily Legrand LABOR, PA-C   Medical Decision Making Amount and/or Complexity of Data Reviewed Labs: ordered. Radiology: ordered.  Risk Prescription drug management.   In short, patient presents to the emergency department concerns of abdominal pain primary towards the periumbilical region as well as vomiting ongoing for the last several days.  No reported hematemesis or medic easier.  Denies any diarrhea.  Reports pain started around 3 AM this morning has had  difficulty getting symptoms under control.  States he had a normal bowel movement yesterday.  No reported urinary symptoms.  At time of signout, pending disposition per results of CT abdomen pelvis and reassessment.  CT imaging negative for any acute findings.  Bilateral nonobstructing renal calculi present but no signs of pyelonephritis, urolithiasis, or other renal abnormalities.  Based on workup, suspect possible gastroenteritis vs CHS. Will discharge home with Zofran  for continued nausea control and advised slowly diet progression by starting with clear fluids and progressive to solids over several days. Otherwise stable at this time for outpatient follow up and discharged home.    Surina Storts A, PA-C 12/23/23 1750    Freddi Hamilton, MD 12/26/23 0730

## 2023-12-24 ENCOUNTER — Other Ambulatory Visit: Payer: Self-pay

## 2023-12-31 ENCOUNTER — Ambulatory Visit: Payer: MEDICAID

## 2023-12-31 ENCOUNTER — Encounter: Payer: Self-pay | Admitting: Family Medicine

## 2024-02-11 ENCOUNTER — Emergency Department (HOSPITAL_COMMUNITY): Payer: MEDICAID

## 2024-02-11 ENCOUNTER — Inpatient Hospital Stay (HOSPITAL_COMMUNITY)
Admission: EM | Admit: 2024-02-11 | Discharge: 2024-02-13 | DRG: 683 | Disposition: A | Discharge status: Home / Self Care | Payer: MEDICAID | Attending: Internal Medicine | Admitting: Internal Medicine

## 2024-02-11 ENCOUNTER — Encounter (HOSPITAL_COMMUNITY): Payer: Self-pay | Admitting: Emergency Medicine

## 2024-02-11 ENCOUNTER — Other Ambulatory Visit: Payer: Self-pay

## 2024-02-11 DIAGNOSIS — G8929 Other chronic pain: Secondary | ICD-10-CM | POA: Diagnosis present

## 2024-02-11 DIAGNOSIS — F1910 Other psychoactive substance abuse, uncomplicated: Secondary | ICD-10-CM

## 2024-02-11 DIAGNOSIS — Z91018 Allergy to other foods: Secondary | ICD-10-CM | POA: Diagnosis not present

## 2024-02-11 DIAGNOSIS — N200 Calculus of kidney: Secondary | ICD-10-CM | POA: Diagnosis present

## 2024-02-11 DIAGNOSIS — Z79899 Other long term (current) drug therapy: Secondary | ICD-10-CM

## 2024-02-11 DIAGNOSIS — N179 Acute kidney failure, unspecified: Principal | ICD-10-CM | POA: Diagnosis present

## 2024-02-11 DIAGNOSIS — Z720 Tobacco use: Secondary | ICD-10-CM

## 2024-02-11 DIAGNOSIS — E872 Acidosis, unspecified: Secondary | ICD-10-CM | POA: Diagnosis present

## 2024-02-11 DIAGNOSIS — R109 Unspecified abdominal pain: Principal | ICD-10-CM

## 2024-02-11 DIAGNOSIS — E860 Dehydration: Secondary | ICD-10-CM | POA: Diagnosis present

## 2024-02-11 DIAGNOSIS — R112 Nausea with vomiting, unspecified: Secondary | ICD-10-CM

## 2024-02-11 DIAGNOSIS — F1010 Alcohol abuse, uncomplicated: Secondary | ICD-10-CM | POA: Diagnosis present

## 2024-02-11 DIAGNOSIS — Z830 Family history of human immunodeficiency virus [HIV] disease: Secondary | ICD-10-CM | POA: Diagnosis not present

## 2024-02-11 DIAGNOSIS — F1210 Cannabis abuse, uncomplicated: Secondary | ICD-10-CM | POA: Diagnosis present

## 2024-02-11 DIAGNOSIS — Z800 Family history of malignant neoplasm of digestive organs: Secondary | ICD-10-CM

## 2024-02-11 DIAGNOSIS — I10 Essential (primary) hypertension: Secondary | ICD-10-CM | POA: Diagnosis not present

## 2024-02-11 DIAGNOSIS — M545 Low back pain, unspecified: Secondary | ICD-10-CM | POA: Diagnosis present

## 2024-02-11 DIAGNOSIS — F1410 Cocaine abuse, uncomplicated: Secondary | ICD-10-CM | POA: Diagnosis present

## 2024-02-11 DIAGNOSIS — F17210 Nicotine dependence, cigarettes, uncomplicated: Secondary | ICD-10-CM | POA: Diagnosis present

## 2024-02-11 LAB — CBC WITH DIFFERENTIAL/PLATELET
Abs Immature Granulocytes: 0.08 K/uL — ABNORMAL HIGH (ref 0.00–0.07)
Basophils Absolute: 0 K/uL (ref 0.0–0.1)
Basophils Relative: 0 %
Eosinophils Absolute: 0 K/uL (ref 0.0–0.5)
Eosinophils Relative: 0 %
HCT: 58.2 % — ABNORMAL HIGH (ref 39.0–52.0)
Hemoglobin: 18.8 g/dL — ABNORMAL HIGH (ref 13.0–17.0)
Immature Granulocytes: 1 %
Lymphocytes Relative: 6 %
Lymphs Abs: 1 K/uL (ref 0.7–4.0)
MCH: 30.2 pg (ref 26.0–34.0)
MCHC: 32.3 g/dL (ref 30.0–36.0)
MCV: 93.4 fL (ref 80.0–100.0)
Monocytes Absolute: 0.9 K/uL (ref 0.1–1.0)
Monocytes Relative: 6 %
Neutro Abs: 13.3 K/uL — ABNORMAL HIGH (ref 1.7–7.7)
Neutrophils Relative %: 87 %
Platelets: 288 K/uL (ref 150–400)
RBC: 6.23 MIL/uL — ABNORMAL HIGH (ref 4.22–5.81)
RDW: 13.3 % (ref 11.5–15.5)
WBC: 15.3 K/uL — ABNORMAL HIGH (ref 4.0–10.5)
nRBC: 0 % (ref 0.0–0.2)

## 2024-02-11 LAB — URINALYSIS, ROUTINE W REFLEX MICROSCOPIC
Bilirubin Urine: NEGATIVE
Glucose, UA: NEGATIVE mg/dL
Ketones, ur: NEGATIVE mg/dL
Nitrite: NEGATIVE
Protein, ur: >=300 mg/dL — AB
Specific Gravity, Urine: 1.016 (ref 1.005–1.030)
WBC, UA: >50 WBC/hpf (ref 0–5)
pH: 5 (ref 5.0–8.0)

## 2024-02-11 LAB — URINE DRUG SCREEN
Amphetamines: NEGATIVE
Barbiturates: NEGATIVE
Benzodiazepines: NEGATIVE
Cocaine: POSITIVE — AB
Fentanyl: NEGATIVE
Methadone Scn, Ur: NEGATIVE
Opiates: POSITIVE — AB
Tetrahydrocannabinol: NEGATIVE

## 2024-02-11 LAB — COMPREHENSIVE METABOLIC PANEL WITH GFR
ALT: 21 U/L (ref 0–44)
AST: 40 U/L (ref 15–41)
Albumin: 5.2 g/dL — ABNORMAL HIGH (ref 3.5–5.0)
Alkaline Phosphatase: 103 U/L (ref 38–126)
Anion gap: 25 — ABNORMAL HIGH (ref 5–15)
BUN: 46 mg/dL — ABNORMAL HIGH (ref 6–20)
CO2: 20 mmol/L — ABNORMAL LOW (ref 22–32)
Calcium: 10.5 mg/dL — ABNORMAL HIGH (ref 8.9–10.3)
Chloride: 86 mmol/L — ABNORMAL LOW (ref 98–111)
Creatinine, Ser: 6.8 mg/dL — ABNORMAL HIGH (ref 0.61–1.24)
GFR, Estimated: 9 mL/min — ABNORMAL LOW (ref >60)
Glucose, Bld: 124 mg/dL — ABNORMAL HIGH (ref 70–99)
Potassium: 4.3 mmol/L (ref 3.5–5.1)
Sodium: 131 mmol/L — ABNORMAL LOW (ref 135–145)
Total Bilirubin: 0.6 mg/dL (ref 0.0–1.2)
Total Protein: 10.5 g/dL — ABNORMAL HIGH (ref 6.5–8.1)

## 2024-02-11 LAB — HEMOGLOBIN AND HEMATOCRIT, BLOOD
HCT: 46.7 % (ref 39.0–52.0)
Hemoglobin: 15.3 g/dL (ref 13.0–17.0)

## 2024-02-11 LAB — I-STAT CHEM 8, ED
BUN: 50 mg/dL — ABNORMAL HIGH (ref 6–20)
Calcium, Ion: 0.99 mmol/L — ABNORMAL LOW (ref 1.15–1.40)
Chloride: 98 mmol/L (ref 98–111)
Creatinine, Ser: 7.5 mg/dL — ABNORMAL HIGH (ref 0.61–1.24)
Glucose, Bld: 128 mg/dL — ABNORMAL HIGH (ref 70–99)
HCT: 59 % — ABNORMAL HIGH (ref 39.0–52.0)
Hemoglobin: 20.1 g/dL — ABNORMAL HIGH (ref 13.0–17.0)
Potassium: 4 mmol/L (ref 3.5–5.1)
Sodium: 132 mmol/L — ABNORMAL LOW (ref 135–145)
TCO2: 22 mmol/L (ref 22–32)

## 2024-02-11 LAB — I-STAT CG4 LACTIC ACID, ED: Lactic Acid, Venous: 3.2 mmol/L (ref 0.5–1.9)

## 2024-02-11 LAB — LIPASE, BLOOD: Lipase: 28 U/L (ref 11–51)

## 2024-02-11 MED ORDER — SODIUM CHLORIDE 0.9 % IV BOLUS
1000.0000 mL | Freq: Once | INTRAVENOUS | Status: AC
  Administered 2024-02-11: 1000 mL via INTRAVENOUS

## 2024-02-11 MED ORDER — MORPHINE SULFATE (PF) 4 MG/ML IV SOLN
4.0000 mg | Freq: Once | INTRAVENOUS | Status: AC
  Administered 2024-02-11: 4 mg via INTRAVENOUS
  Filled 2024-02-11: qty 1

## 2024-02-11 MED ORDER — HYDROMORPHONE HCL 1 MG/ML IJ SOLN
0.5000 mg | INTRAMUSCULAR | Status: AC | PRN
  Administered 2024-02-12 (×3): 0.5 mg via INTRAVENOUS
  Filled 2024-02-11 (×3): qty 0.5

## 2024-02-11 MED ORDER — HEPARIN SODIUM (PORCINE) 5000 UNIT/ML IJ SOLN
5000.0000 [IU] | Freq: Three times a day (TID) | INTRAMUSCULAR | Status: DC
  Administered 2024-02-11 – 2024-02-13 (×5): 5000 [IU] via SUBCUTANEOUS
  Filled 2024-02-11 (×5): qty 1

## 2024-02-11 MED ORDER — SODIUM CHLORIDE 0.9 % IV SOLN: INTRAVENOUS | Status: DC

## 2024-02-11 MED ORDER — ONDANSETRON HCL 4 MG/2ML IJ SOLN
4.0000 mg | Freq: Once | INTRAMUSCULAR | Status: AC
  Administered 2024-02-11: 4 mg via INTRAVENOUS
  Filled 2024-02-11: qty 2

## 2024-02-11 MED ORDER — ALUM & MAG HYDROXIDE-SIMETH 200-200-20 MG/5ML PO SUSP
30.0000 mL | Freq: Once | ORAL | Status: AC
  Administered 2024-02-11: 30 mL via ORAL
  Filled 2024-02-11: qty 30

## 2024-02-11 MED ORDER — NICOTINE 21 MG/24HR TD PT24
21.0000 mg | MEDICATED_PATCH | Freq: Every day | TRANSDERMAL | Status: DC | PRN
  Filled 2024-02-11: qty 1

## 2024-02-11 MED ORDER — SODIUM CHLORIDE 0.9% FLUSH
3.0000 mL | Freq: Two times a day (BID) | INTRAVENOUS | Status: DC
  Administered 2024-02-12 – 2024-02-13 (×3): 3 mL via INTRAVENOUS

## 2024-02-11 MED ORDER — HYDROMORPHONE HCL 1 MG/ML IJ SOLN
1.0000 mg | Freq: Once | INTRAMUSCULAR | Status: AC
  Administered 2024-02-11: 1 mg via INTRAVENOUS
  Filled 2024-02-11: qty 1

## 2024-02-11 MED ORDER — FAMOTIDINE IN NACL 20-0.9 MG/50ML-% IV SOLN
20.0000 mg | Freq: Once | INTRAVENOUS | Status: AC
  Administered 2024-02-11: 20 mg via INTRAVENOUS
  Filled 2024-02-11: qty 50

## 2024-02-11 NOTE — ED Provider Notes (Signed)
 Pt signed out by Dr. Elnor pending labs and CT.  CBC with wbc elevated at 15.3, hgb elevated at 18.8 (likely due to hemoconcentration)  CMP with Na low at 131, bun elevated at 46 and cr 6.8 (bun 7 and cr 0.86 on 9/8); lactic elevated at 3.2  CT abd/pelvis WO: Multiple right renal calculi, largest measuring 11 mm.  2. Appendix not definitely visualized; evaluation limited by lack of IV/oral  contrast and low intraperitoneal fat.    Pt d/w Dr. Arthea (triad) for admission.   Dean Clarity, MD 02/11/24 [phone]

## 2024-02-11 NOTE — ED Provider Notes (Signed)
 [place] EMERGENCY DEPARTMENT AT [facility] Provider Note  CSN: [id number] Arrival date & time: 02/11/24 1421  Chief Complaint(s) Weakness  HPI Akim A Gewirtz is a 58 y.o. male with past medical history as below, significant for chronic low back pain, anxiety, depression, nephrolithiasis, marijuana use, alcohol abuse, cholelithiasis who presents to the ED with complaint of abdominal pain, nausea vomiting  Patient is here with family member.  Report has been feeling well over the past few days, seems to worsen the past 2 days.  Patient generalized weakness, having difficulty tolerating p.o.  Nausea and vomiting shortly after eating or drinking.  No change to bladder function but has not had bowel movement over the past few days.  No fevers or chills.  Pain primarily epigastrium right upper quadrant.  Family denies any recent alcohol or THC use.  Past Medical History Past Medical History:  Diagnosis Date   Anxiety    Chronic lower back pain    Depression    Internal hemorrhoids with other complication    Nephrolithiasis    Rectal abscess    Patient Active Problem List   Diagnosis Date Noted   Abdominal pain 09/30/2023   Marijuana abuse 09/30/2023   Alcohol abuse 03/04/2023   Intractable vomiting with nausea 11/19/2022   Acute pyelonephritis 03/14/2022   AKI (acute kidney injury) 03/14/2022   Pyelonephritis 03/14/2022   Primary hypertension 12/20/2021   Chronic gastritis without bleeding 12/20/2021   Tobacco use 12/20/2021   Home Medication(s) Prior to Admission medications   Medication Sig Start Date End Date Taking? Authorizing Provider  amLODipine  (NORVASC ) 5 MG tablet Take 1 tablet (5 mg total) by mouth daily. Patient not taking: Reported on 09/30/2023 03/18/23   Mayers, Cari S, PA-C  ondansetron  (ZOFRAN -ODT) 4 MG disintegrating tablet Take 1 tablet (4 mg total) by mouth every 8 (eight) hours as needed for nausea or vomiting. 12/23/23   Zelaya, Oscar A, PA-C   pantoprazole  (PROTONIX ) 40 MG tablet Take 1 tablet (40 mg total) by mouth daily. Patient not taking: Reported on 09/30/2023 03/18/23 03/17/24  Mayers, Kirk RAMAN, PA-C  tamsulosin  (FLOMAX ) 0.4 MG CAPS capsule Take 1 capsule (0.4 mg total) by mouth daily. Patient not taking: Reported on 09/30/2023 03/18/23   Mayers, Kirk RAMAN, PA-C                                                                                                                                    Past Surgical History Past Surgical History:  Procedure Laterality Date   HERNIA REPAIR     INCISION AND DRAINAGE PERIRECTAL ABSCESS  10/2010   Family History Family History  Problem Relation Age of Onset   HIV/AIDS Father    Stomach cancer Sister    Stomach cancer Brother     Social History Social History   Tobacco Use   Smoking status: Every Day    Current packs/day: 0.75  Average packs/day: 0.8 packs/day for 28.0 years (21.0 ttl pk-yrs)    Types: Cigarettes   Smokeless tobacco: Former  Building Services Engineer status: Never Used  Substance Use Topics   Alcohol use: Yes   Drug use: Yes    Types: Marijuana   Allergies Tomato  Review of Systems A thorough review of systems was obtained and all systems are negative except as noted in the HPI and PMH.   Physical Exam Vital Signs  I have reviewed the triage vital signs BP (!) 123/108   Pulse (!) 108   Temp 97.6 F (36.4 C) (Oral)   Resp 16   SpO2 100%  Physical Exam Vitals and nursing note reviewed.  Constitutional:      General: He is not in acute distress.    Appearance: He is well-developed.  HENT:     Head: Normocephalic and atraumatic.     Right Ear: External ear normal.     Left Ear: External ear normal.     Mouth/Throat:     Mouth: Mucous membranes are dry.  Eyes:     General: No scleral icterus. Cardiovascular:     Rate and Rhythm: Regular rhythm. Tachycardia present.     Pulses: Normal pulses.     Heart sounds: Normal heart sounds.  Pulmonary:      Effort: Pulmonary effort is normal. No respiratory distress.     Breath sounds: Normal breath sounds.  Abdominal:     General: Abdomen is flat.     Palpations: Abdomen is soft.     Tenderness: There is abdominal tenderness. Positive signs include Murphy's sign.   Musculoskeletal:     Cervical back: No rigidity.     Right lower leg: No edema.     Left lower leg: No edema.  Skin:    General: Skin is warm and dry.     Capillary Refill: Capillary refill takes less than 2 seconds.  Neurological:     Mental Status: He is alert and oriented to person, place, and time.     GCS: GCS eye subscore is 4. GCS verbal subscore is 5. GCS motor subscore is 6.     Cranial Nerves: Cranial nerves 2-12 are intact. No dysarthria or facial asymmetry.     Sensory: Sensation is intact. No sensory deficit.     Motor: Motor function is intact. No tremor.     Coordination: Coordination is intact.     Comments: Strength 4/5 to BLUE/BLLE, equal and symmetric    Psychiatric:        Mood and Affect: Mood normal.        Behavior: Behavior normal.     ED Results and Treatments Labs (all labs ordered are listed, but only abnormal results are displayed) Labs Reviewed  CBC WITH DIFFERENTIAL/PLATELET  COMPREHENSIVE METABOLIC PANEL WITH GFR  LIPASE, BLOOD  URINALYSIS, ROUTINE W REFLEX MICROSCOPIC  Radiology No results found.  Pertinent labs & imaging results that were available during my care of the patient were reviewed by me and considered in my medical decision making (see MDM for details).  Medications Ordered in ED Medications  sodium chloride  0.9 % bolus 1,000 mL (1,000 mLs Intravenous New Bag/Given 02/11/24 1530)  HYDROmorphone  (DILAUDID ) injection 1 mg (1 mg Intravenous Given 02/11/24 1531)  ondansetron  (ZOFRAN ) injection 4 mg (4 mg Intravenous Given 02/11/24 1531)                                                                                                                                      Procedures Procedures  (including critical care time)  Medical Decision Making / ED Course    Medical Decision Making:    Sasan A Tholl is a 58 y.o. male with past medical history as below, significant for chronic low back pain, anxiety, depression, nephrolithiasis, marijuana use, alcohol abuse, cholelithiasis who presents to the ED with complaint of abdominal pain, nausea vomiting. The complaint involves an extensive differential diagnosis and also carries with it a high risk of complications and morbidity.  Serious etiology was considered. Ddx includes but is not limited to: Differential diagnosis includes but is not exclusive to acute cholecystitis, intrathoracic causes for epigastric abdominal pain, gastritis, duodenitis, pancreatitis, small bowel or large bowel obstruction, abdominal aortic aneurysm, hernia, gastritis, etc.   Complete initial physical exam performed, notably the patient was in no acute distress, appears to be in pain.    Reviewed and confirmed nursing documentation for past medical history, family history, social history.  Vital signs reviewed.    Abdominal pain Nausea vomiting> - Patient w/ history of nephrolithiasis and cholelithiasis per family.  Prior alcohol and THC abuse. - Positive Murphy sign on exam, abdomen is not peritoneal, pain to epigastrium as well - Will provide analgesia, fluids, check labs and CT of the abdomen - CT a/p on 12/23/23 with bilateral renal calculi - Handoff to incoming EDP pending labs and imaging, recheck                      Additional history obtained: -Additional history obtained from family -External records from outside source obtained and reviewed including: Chart review including previous notes, labs, imaging, consultation notes including  Prior imaging, prior labs, prior ED evaluation   Lab  Tests: -I ordered, reviewed, and interpreted labs.   The pertinent results include:   Labs Reviewed  CBC WITH DIFFERENTIAL/PLATELET  COMPREHENSIVE METABOLIC PANEL WITH GFR  LIPASE, BLOOD  URINALYSIS, ROUTINE W REFLEX MICROSCOPIC    Labs are pending  EKG   EKG Interpretation Date/Time:    Ventricular Rate:    PR Interval:    QRS Duration:    QT Interval:    QTC Calculation:   R Axis:      Text Interpretation:  Imaging Studies ordered: I ordered imaging studies including CT abdomen/pelvis>> pending    Medicines ordered and prescription drug management: Meds ordered this encounter  Medications   sodium chloride  0.9 % bolus 1,000 mL   HYDROmorphone  (DILAUDID ) injection 1 mg   ondansetron  (ZOFRAN ) injection 4 mg    -I have reviewed the patients home medicines and have made adjustments as needed   Consultations Obtained: na   Cardiac Monitoring: The patient was maintained on a cardiac monitor.  I personally viewed and interpreted the cardiac monitored which showed an underlying rhythm of: nsr Continuous pulse oximetry interpreted by myself, 100% on RA.    Social Determinants of Health:  Diagnosis or treatment significantly limited by social determinants of health: current smoker and alcohol use, thc use   Reevaluation: After the interventions noted above, I reevaluated the patient and found that they have improved  Co morbidities that complicate the patient evaluation  Past Medical History:  Diagnosis Date   Anxiety    Chronic lower back pain    Depression    Internal hemorrhoids with other complication    Nephrolithiasis    Rectal abscess       Dispostion: Disposition decision including need for hospitalization was considered, and patient disposition pending at time of sign out.    Final Clinical Impression(s) / ED Diagnoses Final diagnoses:  Abdominal pain, unspecified abdominal location  Nausea and vomiting, unspecified vomiting  type        Elnor Jayson LABOR, DO 02/11/24 1545

## 2024-02-11 NOTE — H&P (Addendum)
 History and Physical    Patient: Daniel [name] FMW:[id number] DOB: Jun 22, 1965 DOA: 02/11/2024 DOS: the patient was seen and examined on 02/11/2024 PCP: [name], MD  Patient coming from: Home  Chief Complaint:  Chief Complaint  Patient presents with   Weakness   HPI: Daniel [name] is a 58 y.o. male with medical history significant for cocaine, alcohol, tobacco, and marijuana use.  He was brought in by his sister because he has gotten progressively more weak.  He fell 3 times in the last couple days.  Today he could not even walk.  This is definitely not his baseline she also notes that he has had persistent visible rhythmic cramping in his extremities.  She actually showed me a video from her phone of his deltoids rhythmic really spasming.  The patient said he has had trouble with nausea vomiting for the last 3 days.  No fever or chills, he has gotten weaker every day.  He is having diffuse abdominal pain also. The patient was evaluated in the emergency department and found to have an elevated creatinine of 6.0 and then 7.5.  His white count was 15.3.  The patient is being hydrated in the emergency department and the hospitalist are asked to admit.  At the time of my evaluation the patient had a had only 1 L NS.  Bladder scan was empty at that time.  Patient says he has had something similar but not this severe.  He was admitted for intractable nausea and vomiting with lactic acidosis for months ago.   Review of Systems: As mentioned in the history of present illness. All other systems reviewed and are negative. Past Medical History:  Diagnosis Date   Anxiety    Chronic lower back pain    Depression    Internal hemorrhoids with other complication    Nephrolithiasis    Rectal abscess    Past Surgical History:  Procedure Laterality Date   HERNIA REPAIR     INCISION AND DRAINAGE PERIRECTAL ABSCESS  10/2010   Social History:  reports that he has been smoking cigarettes. He has a  21 pack-year smoking history. He has quit using smokeless tobacco. He reports current alcohol use. He reports current drug use. Drug: Marijuana.  Allergies  Allergen Reactions   Tomato Rash    Family History  Problem Relation Age of Onset   HIV/AIDS Father    Stomach cancer Sister    Stomach cancer Brother     Prior to Admission medications   Medication Sig Start Date End Date Taking? Authorizing Provider  amLODipine  (NORVASC ) 5 MG tablet Take 1 tablet (5 mg total) by mouth daily. Patient not taking: Reported on 09/30/2023 03/18/23   Mayers, Cari S, PA-C  ondansetron  (ZOFRAN -ODT) 4 MG disintegrating tablet Take 1 tablet (4 mg total) by mouth every 8 (eight) hours as needed for nausea or vomiting. 12/23/23   Zelaya, Oscar A, PA-C  pantoprazole  (PROTONIX ) 40 MG tablet Take 1 tablet (40 mg total) by mouth daily. Patient not taking: Reported on 09/30/2023 03/18/23 03/17/24  Mayers, Kirk RAMAN, PA-C  tamsulosin  (FLOMAX ) 0.4 MG CAPS capsule Take 1 capsule (0.4 mg total) by mouth daily. Patient not taking: Reported on 09/30/2023 03/18/23   Darcus Kirk RAMAN, PA-C    Physical Exam: Vitals:   02/11/24 1428 02/11/24 1630  BP: (!) 123/108 (!) 144/82  Pulse: (!) 108 82  Resp: 16 16  Temp: 97.6 F (36.4 C)   TempSrc: Oral   SpO2: 100% 100%  Physical Exam:  General: No acute distress, malnourished HEENT: Normocephalic, atraumatic, PERRL Cardiovascular: Normal rate and rhythm. Distal pulses intact. Pulmonary: Normal pulmonary effort, normal breath sounds Gastrointestinal: Nondistended abdomen, some guarding, full, tender diffusely, hypoactive bowel sounds Musculoskeletal:Normal ROM, no lower ext edema Lymphadenopathy: No cervical LAD. Skin: Skin is warm and dry. Neuro: No focal deficits noted, AAOx3. PSYCH: Attentive and cooperative  Data Reviewed:  Results for orders placed or performed during the hospital encounter of 02/11/24 (from the past 24 hours)  CBC with Differential     Status:  Abnormal   Collection Time: 02/11/24  3:51 PM  Result Value Ref Range   WBC 15.3 (H) 4.0 - 10.5 K/uL   RBC 6.23 (H) 4.22 - 5.81 MIL/uL   Hemoglobin 18.8 (H) 13.0 - 17.0 g/dL   HCT 41.7 (H) 60.9 - 47.9 %   MCV 93.4 80.0 - 100.0 fL   MCH 30.2 26.0 - 34.0 pg   MCHC 32.3 30.0 - 36.0 g/dL   RDW 86.6 88.4 - 84.4 %   Platelets 288 150 - 400 K/uL   nRBC 0.0 0.0 - 0.2 %   Neutrophils Relative % 87 %   Neutro Abs 13.3 (H) 1.7 - 7.7 K/uL   Lymphocytes Relative 6 %   Lymphs Abs 1.0 0.7 - 4.0 K/uL   Monocytes Relative 6 %   Monocytes Absolute 0.9 0.1 - 1.0 K/uL   Eosinophils Relative 0 %   Eosinophils Absolute 0.0 0.0 - 0.5 K/uL   Basophils Relative 0 %   Basophils Absolute 0.0 0.0 - 0.1 K/uL   Immature Granulocytes 1 %   Abs Immature Granulocytes 0.08 (H) 0.00 - 0.07 K/uL  Comprehensive metabolic panel     Status: Abnormal   Collection Time: 02/11/24  3:51 PM  Result Value Ref Range   Sodium 131 (L) 135 - 145 mmol/L   Potassium 4.3 3.5 - 5.1 mmol/L   Chloride 86 (L) 98 - 111 mmol/L   CO2 20 (L) 22 - 32 mmol/L   Glucose, Bld 124 (H) 70 - 99 mg/dL   BUN 46 (H) 6 - 20 mg/dL   Creatinine, Ser 3.19 (H) 0.61 - 1.24 mg/dL   Calcium 89.4 (H) 8.9 - 10.3 mg/dL   Total Protein 89.4 (H) 6.5 - 8.1 g/dL   Albumin 5.2 (H) 3.5 - 5.0 g/dL   AST 40 15 - 41 U/L   ALT 21 0 - 44 U/L   Alkaline Phosphatase 103 38 - 126 U/L   Total Bilirubin 0.6 0.0 - 1.2 mg/dL   GFR, Estimated 9 (L) >60 mL/min   Anion gap 25 (H) 5 - 15  Lipase, blood     Status: None   Collection Time: 02/11/24  3:51 PM  Result Value Ref Range   Lipase 28 11 - 51 U/L  I-stat chem 8, ED (not at [facility], [facility] or [facility])     Status: Abnormal   Collection Time: 02/11/24  4:01 PM  Result Value Ref Range   Sodium 132 (L) 135 - 145 mmol/L   Potassium 4.0 3.5 - 5.1 mmol/L   Chloride 98 98 - 111 mmol/L   BUN 50 (H) 6 - 20 mg/dL   Creatinine, Ser 2.49 (H) 0.61 - 1.24 mg/dL   Glucose, Bld 871 (H) 70 - 99 mg/dL   Calcium, Ion 9.00 (L) 1.15 -  1.40 mmol/L   TCO2 22 22 - 32 mmol/L   Hemoglobin 20.1 (H) 13.0 - 17.0 g/dL   HCT 40.9 (H)  39.0 - 52.0 %  I-Stat CG4 Lactic Acid     Status: Abnormal   Collection Time: 02/11/24  4:02 PM  Result Value Ref Range   Lactic Acid, Venous 3.2 (HH) 0.5 - 1.9 mmol/L   Comment NOTIFIED PHYSICIAN      Assessment and Plan: Acute renal failure - hopefully this is all secondary to volume depletion - Aggressive IV fluids.  Monitor creatinine and urine output.  2.  History of polysubstance abuse -discussion about this was deferred because the patient had family in the room.  - A UDS has been ordered but the patient had not made any urine by the time that he received narcotics for pain in the emergency department. - He denies significant alcohol use.  He says he does not drink every day and does not have any trouble with withdrawal symptoms. - Would question him to make sure that the nausea vomiting and abdominal pain that started this is not because withdrawal or cannabis.   3.  Tobacco abuse -he smokes about a pack a day but says he does not think he needs a nicotine  patch.  4.  He has an elevated anion gap acidosis plus lactic acidosis -continue IV fluids and monitor.  5. Persistent N/V -  seems to be resolved.  The patient is drinking liquids without any difficulty in the emergency department.   Advance Care Planning:   Code Status: Prior  He names his cousin Katheryn as his surrogate decision maker and wants to be full code.  Consults: None  Family Communication: This cousin was at bedside  Severity of Illness: The appropriate patient status for this patient is INPATIENT. Inpatient status is judged to be reasonable and necessary in order to provide the required intensity of service to ensure the patient's safety. The patient's presenting symptoms, physical exam findings, and initial radiographic and laboratory data in the context of their chronic comorbidities is felt to place them at high risk  for further clinical deterioration. Furthermore, it is not anticipated that the patient will be medically stable for discharge from the hospital within 2 midnights of admission.   * I certify that at the point of admission it is my clinical judgment that the patient will require inpatient hospital care spanning beyond 2 midnights from the point of admission due to high intensity of service, high risk for further deterioration and high frequency of surveillance required.*  Author: ARTHEA CHILD, MD 02/11/2024 5:59 PM  For on call review [email or web address].

## 2024-02-11 NOTE — ED Triage Notes (Signed)
 Per family pt has been progressively weak since Saturday. Per family pt is not acting like himself. Per responds to voice and grimacing in pain.

## 2024-02-11 NOTE — ED Notes (Signed)
Unable to draw back labs from IV.

## 2024-02-12 DIAGNOSIS — N179 Acute kidney failure, unspecified: Secondary | ICD-10-CM | POA: Diagnosis not present

## 2024-02-12 LAB — CBC
HCT: 46.4 % (ref 39.0–52.0)
Hemoglobin: 14.9 g/dL (ref 13.0–17.0)
MCH: 30 pg (ref 26.0–34.0)
MCHC: 32.1 g/dL (ref 30.0–36.0)
MCV: 93.4 fL (ref 80.0–100.0)
Platelets: 264 K/uL (ref 150–400)
RBC: 4.97 MIL/uL (ref 4.22–5.81)
RDW: 13.2 % (ref 11.5–15.5)
WBC: 15.6 K/uL — ABNORMAL HIGH (ref 4.0–10.5)
nRBC: 0 % (ref 0.0–0.2)

## 2024-02-12 LAB — BASIC METABOLIC PANEL WITH GFR
Anion gap: 14 (ref 5–15)
BUN: 48 mg/dL — ABNORMAL HIGH (ref 6–20)
CO2: 23 mmol/L (ref 22–32)
Calcium: 9 mg/dL (ref 8.9–10.3)
Chloride: 95 mmol/L — ABNORMAL LOW (ref 98–111)
Creatinine, Ser: 4.45 mg/dL — ABNORMAL HIGH (ref 0.61–1.24)
GFR, Estimated: 15 mL/min — ABNORMAL LOW (ref >60)
Glucose, Bld: 121 mg/dL — ABNORMAL HIGH (ref 70–99)
Potassium: 4.4 mmol/L (ref 3.5–5.1)
Sodium: 132 mmol/L — ABNORMAL LOW (ref 135–145)

## 2024-02-12 LAB — COMPREHENSIVE METABOLIC PANEL WITH GFR
ALT: 18 U/L (ref 0–44)
AST: 36 U/L (ref 15–41)
Albumin: 4 g/dL (ref 3.5–5.0)
Alkaline Phosphatase: 75 U/L (ref 38–126)
Anion gap: 14 (ref 5–15)
BUN: 45 mg/dL — ABNORMAL HIGH (ref 6–20)
CO2: 19 mmol/L — ABNORMAL LOW (ref 22–32)
Calcium: 8.9 mg/dL (ref 8.9–10.3)
Chloride: 96 mmol/L — ABNORMAL LOW (ref 98–111)
Creatinine, Ser: 4.02 mg/dL — ABNORMAL HIGH (ref 0.61–1.24)
GFR, Estimated: 17 mL/min — ABNORMAL LOW (ref >60)
Glucose, Bld: 111 mg/dL — ABNORMAL HIGH (ref 70–99)
Potassium: 4.3 mmol/L (ref 3.5–5.1)
Sodium: 130 mmol/L — ABNORMAL LOW (ref 135–145)
Total Bilirubin: 0.7 mg/dL (ref 0.0–1.2)
Total Protein: 7.7 g/dL (ref 6.5–8.1)

## 2024-02-12 LAB — MAGNESIUM: Magnesium: 2.7 mg/dL — ABNORMAL HIGH (ref 1.7–2.4)

## 2024-02-12 LAB — LACTIC ACID, PLASMA
Lactic Acid, Venous: 2.4 mmol/L (ref 0.5–1.9)
Lactic Acid, Venous: 2.2 mmol/L (ref 0.5–1.9)

## 2024-02-12 LAB — HEMOGLOBIN A1C
Hgb A1c MFr Bld: 5.3 % (ref 4.8–5.6)
Mean Plasma Glucose: 105.41 mg/dL

## 2024-02-12 MED ORDER — ALUM & MAG HYDROXIDE-SIMETH 200-200-20 MG/5ML PO SUSP
30.0000 mL | ORAL | Status: DC | PRN
  Administered 2024-02-12: 30 mL via ORAL
  Filled 2024-02-12: qty 30

## 2024-02-12 MED ORDER — SODIUM CHLORIDE 0.9 % IV SOLN: INTRAVENOUS | Status: DC

## 2024-02-12 NOTE — Progress Notes (Signed)
 PROGRESS NOTE  Daniel [name]  FMW:[id number] DOB: [birth year] DOA: 02/11/2024 PCP: [name], MD   Brief Narrative: Patient is a 57 year old male with history of cocaine abuse, alcohol abuse, tobacco abuse, marijuana abuse who presented to the emergency department with complaint of weakness, fall, muscle spasms, nausea, vomiting, abdominal discomfort.  On condition, he was hemodynamically stable.  Lab work showed creatinine of 6, WC count of 15.3.  Found to be dehydrated.  Started on IV fluid.  Kidney function slowly improving.  Assessment & Plan:  Principal Problem:   Acute renal failure (ARF)  Acute renal failure: Baseline creatinine normal.  Presented with creatinine in the range of 6.  CT abdomen /pelvis showed multiple right renal calculi with the largest measuring 11 mm. Nohydronephrosis. No perinephric or periureteral stranding. Urinary bladder was unremarkable.    Kidney function improving.  Likely related to dehydration/volume depletion.  Blood pressure has been stable.  History of polysubstance abuse: History of marijuana abuse, tobacco use.  Counseled cessation.  Declined nicotine  patch  Nausea and vomiting: Resolved.  Abdominal CT did not show any acute findings.  Leukocytosis/lactic acidosis: Unclear etiology .  Likely reactive.  No clear evidence of infectious process.  Culture was not sent on admission         DVT prophylaxis:heparin injection 5,000 Units Start: 02/11/24 2200     Code Status: Full Code  Family Communication: Called and discussed with cousin on phone on 10/29  Patient status: Inpatient  Patient is from : Home  Anticipated discharge to: Home  Estimated DC date: 1 to 2 days   Consultants: None  Procedures: None  Antimicrobials:  Anti-infectives (From admission, onward)    None       Subjective: Patient seen and examined at bedside today.  Comfortable lying on bed.  No nausea or vomiting.  No abdominal pain today.  No problem  with voiding.  We discussed about continue IV fluid for today and checking kidney function tomorrow.  Objective: Vitals:   02/11/24 1944 02/11/24 2342 02/12/24 0405 02/12/24 0756  BP: 132/84 128/77 130/78 (!) 140/111  Pulse: 86 71 69 79  Resp: 18 18 18 17   Temp: 98.7 F (37.1 C) 98.4 F (36.9 C) 98.6 F (37 C) 98.6 F (37 C)  TempSrc:      SpO2: 100% 100% 100% 100%    Intake/Output Summary (Last 24 hours) at 02/12/2024 1004 Last data filed at 02/12/2024 0600 Gross per 24 hour  Intake 2033.69 ml  Output 300 ml  Net 1733.69 ml   There were no vitals filed for this visit.  Examination:  General exam: Overall comfortable, not in distress HEENT: PERRL Respiratory system:  no wheezes or crackles  Cardiovascular system: S1 & S2 heard, RRR.  Gastrointestinal system: Abdomen is nondistended, soft and nontender. Central nervous system: Alert and oriented Extremities: No edema, no clubbing ,no cyanosis Skin: No rashes, no ulcers,no icterus     Data Reviewed: I have personally reviewed following labs and imaging studies  CBC: Recent Labs  Lab 02/11/24 1551 02/11/24 1601 02/11/24 2314 02/12/24 0112  WBC 15.3*  --   --  15.6*  NEUTROABS 13.3*  --   --   --   HGB 18.8* 20.1* 15.3 14.9  HCT 58.2* 59.0* 46.7 46.4  MCV 93.4  --   --  93.4  PLT 288  --   --  264   Basic Metabolic Panel: Recent Labs  Lab 02/11/24 1551 02/11/24 1601 02/11/24 2314 02/12/24 0112  NA 131* 132* 132* 130*  K 4.3 4.0 4.4 4.3  CL 86* 98 95* 96*  CO2 20*  --  23 19*  GLUCOSE 124* 128* 121* 111*  BUN 46* 50* 48* 45*  CREATININE 6.80* 7.50* 4.45* 4.02*  CALCIUM 10.5*  --  9.0 8.9  MG  --   --   --  2.7*     No results found for this or any previous visit (from the past 240 hours).   Radiology Studies: CT ABDOMEN PELVIS WO CONTRAST Result Date: 02/11/2024 EXAM: CT ABDOMEN AND PELVIS WITHOUT CONTRAST 02/11/2024 04:59:00 PM TECHNIQUE: CT of the abdomen and pelvis was performed without the  administration of intravenous contrast. Multiplanar reformatted images are provided for review. Automated exposure control, iterative reconstruction, and/or weight-based adjustment of the mA/kV was utilized to reduce the radiation dose to as low as reasonably achievable. COMPARISON: CT abdomen and pelvis 12/23/2023. CLINICAL HISTORY: Abdominal pain, acute, nonlocalized. Per chart: Per family pt has been progressively weak since Saturday. Per family pt is not acting like himself. Per responds to voice and grimacing in pain. FINDINGS: LOWER CHEST: Emphysematous changes in the lung bases. LIVER: The liver is unremarkable. GALLBLADDER AND BILE DUCTS: Gallbladder is unremarkable. No biliary ductal dilatation. SPLEEN: No acute abnormality. PANCREAS: No acute abnormality. ADRENAL GLANDS: No acute abnormality. KIDNEYS, URETERS AND BLADDER: Multiple right renal calculi with the largest measuring 11 mm. No hydronephrosis. No perinephric or periureteral stranding. Urinary bladder is unremarkable. GI AND BOWEL: Stomach demonstrates no acute abnormality. Appendix is not definitely visualized. There is no bowel obstruction. PERITONEUM AND RETROPERITONEUM: No ascites. No free air. VASCULATURE: Aorta is normal in caliber. There are atherosclerotic calcifications of the aorta and iliac arteries. LYMPH NODES: No lymphadenopathy. REPRODUCTIVE ORGANS: No acute abnormality. BONES AND SOFT TISSUES: No acute osseous abnormality. No focal soft tissue abnormality. LIMITATIONS: Evaluation is limited secondary to lack of oral and intravenous contrast and lack of intraperitoneal fat. IMPRESSION: 1. Multiple right renal calculi, largest measuring 11 mm. 2. Appendix not definitely visualized; evaluation limited by lack of IV/oral contrast and low intraperitoneal fat. Electronically signed by: Greig Pique MD 02/11/2024 05:19 PM EDT RP Workstation: [id number]    Scheduled Meds:  heparin  5,000 Units Subcutaneous Q8H   sodium chloride  flush   3 mL Intravenous Q12H   Continuous Infusions:  sodium chloride  200 mL/hr at 02/12/24 0707     LOS: 1 day   Ivonne Mustache, MD [facility] P10/29/2025, 10:04 AM

## 2024-02-12 NOTE — Progress Notes (Signed)
   02/12/24 1249  TOC Brief Assessment  Insurance and Status Reviewed  Patient has primary care physician Yes  Home environment has been reviewed single family home  Prior level of function: Independent  Prior/Current Home Services No current home services  Social Drivers of Health Review SDOH reviewed no interventions necessary  Readmission risk has been reviewed Yes  Transition of care needs transition of care needs identified, TOC will continue to follow    Signed: Heather Saltness, MSW, LCSW Clinical Social Worker Inpatient Care Management 02/12/2024 12:50 PM

## 2024-02-12 NOTE — Plan of Care (Signed)

## 2024-02-13 ENCOUNTER — Other Ambulatory Visit (HOSPITAL_COMMUNITY): Payer: Self-pay

## 2024-02-13 DIAGNOSIS — N179 Acute kidney failure, unspecified: Secondary | ICD-10-CM | POA: Diagnosis not present

## 2024-02-13 LAB — CBC
HCT: 38.7 % — ABNORMAL LOW (ref 39.0–52.0)
HCT: 38.4 % — ABNORMAL LOW (ref 39.0–52.0)
Hemoglobin: 12.7 g/dL — ABNORMAL LOW (ref 13.0–17.0)
Hemoglobin: 12.7 g/dL — ABNORMAL LOW (ref 13.0–17.0)
MCH: 31.1 pg (ref 26.0–34.0)
MCH: 31.1 pg (ref 26.0–34.0)
MCHC: 32.8 g/dL (ref 30.0–36.0)
MCHC: 33.1 g/dL (ref 30.0–36.0)
MCV: 94.9 fL (ref 80.0–100.0)
MCV: 94.1 fL (ref 80.0–100.0)
Platelets: 197 K/uL (ref 150–400)
Platelets: 205 K/uL (ref 150–400)
RBC: 4.08 MIL/uL — ABNORMAL LOW (ref 4.22–5.81)
RBC: 4.08 MIL/uL — ABNORMAL LOW (ref 4.22–5.81)
RDW: 12.8 % (ref 11.5–15.5)
RDW: 12.8 % (ref 11.5–15.5)
WBC: 8.8 K/uL (ref 4.0–10.5)
WBC: 9.4 K/uL (ref 4.0–10.5)
nRBC: 0 % (ref 0.0–0.2)
nRBC: 0 % (ref 0.0–0.2)

## 2024-02-13 LAB — BASIC METABOLIC PANEL WITH GFR
Anion gap: 7 (ref 5–15)
Anion gap: 9 (ref 5–15)
BUN: 17 mg/dL (ref 6–20)
BUN: 14 mg/dL (ref 6–20)
CO2: 25 mmol/L (ref 22–32)
CO2: 25 mmol/L (ref 22–32)
Calcium: 8.6 mg/dL — ABNORMAL LOW (ref 8.9–10.3)
Calcium: 8.7 mg/dL — ABNORMAL LOW (ref 8.9–10.3)
Chloride: 105 mmol/L (ref 98–111)
Chloride: 105 mmol/L (ref 98–111)
Creatinine, Ser: 0.73 mg/dL (ref 0.61–1.24)
Creatinine, Ser: 0.71 mg/dL (ref 0.61–1.24)
GFR, Estimated: >60 mL/min (ref >60)
GFR, Estimated: >60 mL/min (ref >60)
Glucose, Bld: 85 mg/dL (ref 70–99)
Glucose, Bld: 163 mg/dL — ABNORMAL HIGH (ref 70–99)
Potassium: 4.2 mmol/L (ref 3.5–5.1)
Potassium: 4 mmol/L (ref 3.5–5.1)
Sodium: 137 mmol/L (ref 135–145)
Sodium: 138 mmol/L (ref 135–145)

## 2024-02-13 LAB — LACTIC ACID, PLASMA: Lactic Acid, Venous: 1.1 mmol/L (ref 0.5–1.9)

## 2024-02-13 MED ORDER — TAMSULOSIN HCL 0.4 MG PO CAPS
0.4000 mg | ORAL_CAPSULE | Freq: Every day | ORAL | 0 refills | Status: DC
  Filled 2024-02-13: qty 60, 60d supply, fill #0

## 2024-02-13 MED ORDER — NICOTINE 21 MG/24HR TD PT24
21.0000 mg | MEDICATED_PATCH | Freq: Every day | TRANSDERMAL | 0 refills | Status: AC | PRN
  Filled 2024-02-13: qty 28, 28d supply, fill #0

## 2024-02-13 NOTE — Progress Notes (Signed)
 Discharge medications delivered to patient at the bedside in a secure bag.

## 2024-02-13 NOTE — Discharge Summary (Signed)
 Physician Discharge Summary  Daniel [name] FMW:[id number] DOB: 1965/12/17 DOA: 02/11/2024  PCP: [name], MD  Admit date: 02/11/2024 Discharge date: 02/13/2024  Admitted From: Home Disposition:  Home  Discharge Condition:Stable CODE STATUS:FULL Diet recommendation:  Regular   Brief/Interim Summary: Patient is a 58 year old male with history of cocaine abuse, alcohol abuse, tobacco abuse, marijuana abuse who presented to the emergency department with complaint of weakness, fall, muscle spasms, nausea, vomiting, abdominal discomfort. On condition, he was hemodynamically stable. Lab work showed creatinine of 6, WC count of 15.3. Found to be dehydrated. Started on IV fluid. Kidney function gradually improved and has normalized.  Leukocytosis normalized as well.  Medically stable for discharge home today.  Following problems were addressed during the hospitalization:  Acute renal failure: Baseline creatinine normal.  Presented with creatinine in the range of 6.  CT abdomen /pelvis showed multiple right renal calculi with the largest measuring 11 mm. No hydronephrosis. No perinephric or periureteral stranding. Urinary bladder was unremarkable.    Kidney function normalized  AKI was likely related to dehydration/volume depletion.  Blood pressure has been stable.   History of polysubstance abuse: History of marijuana abuse, tobacco use.  Counseled cessation. Recommended nicotine  patch   Nausea and vomiting: Resolved.  Abdominal CT did not show any acute findings.   Leukocytosis/lactic acidosis: Unclear etiology .  Likely reactive.  No clear evidence of infectious process.  Resolved  Nonobstructive nephrolithiasis: We recommend to follow-up with urology as an outpatient.   Discharge Diagnoses:  Principal Problem:   Acute renal failure (ARF)    Discharge Instructions  Discharge Instructions     Diet general   Complete by: As directed    Discharge instructions   Complete by: As  directed    1)Please take your medications as instructed 2)Follow up with urology as an outpatient.  Name and number of  the provider group has been attached 3)Stop smoking   Increase activity slowly   Complete by: As directed       Allergies as of 02/13/2024       Reactions   Tomato Rash        Medication List     STOP taking these medications    amLODipine  5 MG tablet Commonly known as: NORVASC        TAKE these medications    nicotine  21 mg/24hr patch Commonly known as: NICODERM CQ  - dosed in mg/24 hours Place 1 patch (21 mg total) onto the skin daily as needed (cravings).   ondansetron  4 MG disintegrating tablet Commonly known as: ZOFRAN -ODT Take 1 tablet (4 mg total) by mouth every 8 (eight) hours as needed for nausea or vomiting.   pantoprazole  40 MG tablet Commonly known as: Protonix  Take 1 tablet (40 mg total) by mouth daily.   tamsulosin  0.4 MG Caps capsule Commonly known as: FLOMAX  Take 1 capsule (0.4 mg total) by mouth daily.        Follow-up Information     [name], MD. Call .   Specialty: Family Medicine Why: Follow up from ER visit Contact information: [address] [place] [address] [phone]         [facility] Emergency Department at [facility]. Go to .   Specialty: Emergency Medicine Why: As needed, If symptoms worsen Contact information: [address] [place]  [address] [phone]        [facility]. Schedule an appointment as soon as possible for a visit in 1  week(s).   Contact information: [address] [place] [place]  [address] [phone]               Allergies  Allergen Reactions   Tomato Rash    Consultations: None   Procedures/Studies: CT ABDOMEN PELVIS WO CONTRAST Result Date: 02/11/2024 EXAM: CT ABDOMEN AND PELVIS WITHOUT CONTRAST 02/11/2024 04:59:00 PM TECHNIQUE: CT of the abdomen and pelvis was  performed without the administration of intravenous contrast. Multiplanar reformatted images are provided for review. Automated exposure control, iterative reconstruction, and/or weight-based adjustment of the mA/kV was utilized to reduce the radiation dose to as low as reasonably achievable. COMPARISON: CT abdomen and pelvis 12/23/2023. CLINICAL HISTORY: Abdominal pain, acute, nonlocalized. Per chart: Per family pt has been progressively weak since Saturday. Per family pt is not acting like himself. Per responds to voice and grimacing in pain. FINDINGS: LOWER CHEST: Emphysematous changes in the lung bases. LIVER: The liver is unremarkable. GALLBLADDER AND BILE DUCTS: Gallbladder is unremarkable. No biliary ductal dilatation. SPLEEN: No acute abnormality. PANCREAS: No acute abnormality. ADRENAL GLANDS: No acute abnormality. KIDNEYS, URETERS AND BLADDER: Multiple right renal calculi with the largest measuring 11 mm. No hydronephrosis. No perinephric or periureteral stranding. Urinary bladder is unremarkable. GI AND BOWEL: Stomach demonstrates no acute abnormality. Appendix is not definitely visualized. There is no bowel obstruction. PERITONEUM AND RETROPERITONEUM: No ascites. No free air. VASCULATURE: Aorta is normal in caliber. There are atherosclerotic calcifications of the aorta and iliac arteries. LYMPH NODES: No lymphadenopathy. REPRODUCTIVE ORGANS: No acute abnormality. BONES AND SOFT TISSUES: No acute osseous abnormality. No focal soft tissue abnormality. LIMITATIONS: Evaluation is limited secondary to lack of oral and intravenous contrast and lack of intraperitoneal fat. IMPRESSION: 1. Multiple right renal calculi, largest measuring 11 mm. 2. Appendix not definitely visualized; evaluation limited by lack of IV/oral contrast and low intraperitoneal fat. Electronically signed by: Greig Pique MD 02/11/2024 05:19 PM EDT RP Workstation: [id number]      Subjective: Patient seen and examined at bedside today.   Hemodynamically stable.  Comfortable today.  No new complaints . medically stable for discharge  Discharge Exam: Vitals:   02/12/24 2117 02/13/24 0519  BP: 133/71 123/78  Pulse: 61 62  Resp: 18 18  Temp: 99.5 F (37.5 C) 98.2 F (36.8 C)  SpO2: 100% 100%   Vitals:   02/12/24 0756 02/12/24 1205 02/12/24 2117 02/13/24 0519  BP: (!) 140/111 123/80 133/71 123/78  Pulse: 79 66 61 62  Resp: 17 15 18 18   Temp: 98.6 F (37 C) 99.1 F (37.3 C) 99.5 F (37.5 C) 98.2 F (36.8 C)  TempSrc:      SpO2: 100% 100% 100% 100%    General: Pt is alert, awake, not in acute distress Cardiovascular: RRR, S1/S2 +, no rubs, no gallops Respiratory: CTA bilaterally, no wheezing, no rhonchi Abdominal: Soft, NT, ND, bowel sounds + Extremities: no edema, no cyanosis    The results of significant diagnostics from this hospitalization (including imaging, microbiology, ancillary and laboratory) are listed below for reference.     Microbiology: No results found for this or any previous visit (from the past 240 hours).   Labs: BNP (last 3 results) No results for input(s): BNP in the last 8760 hours. Basic Metabolic Panel: Recent Labs  Lab 02/11/24 1551 02/11/24 1601 02/11/24 2314 02/12/24 0112 02/13/24 0543 02/13/24 0913  NA 131* 132* 132* 130* 137 138  K 4.3 4.0 4.4 4.3 4.2 4.0  CL 86* 98 95* 96* 105 105  CO2 20*  --  23 19* 25 25  GLUCOSE 124* 128* 121* 111* 85 163*  BUN 46* 50* 48* 45* 17 14  CREATININE 6.80* 7.50* 4.45* 4.02* 0.73 0.71  CALCIUM 10.5*  --  9.0 8.9 8.6* 8.7*  MG  --   --   --  2.7*  --   --    Liver Function Tests: Recent Labs  Lab 02/11/24 1551 02/12/24 0112  AST 40 36  ALT 21 18  ALKPHOS 103 75  BILITOT 0.6 0.7  PROT 10.5* 7.7  ALBUMIN 5.2* 4.0   Recent Labs  Lab 02/11/24 1551  LIPASE 28   No results for input(s): AMMONIA in the last 168 hours. CBC: Recent Labs  Lab 02/11/24 1551 02/11/24 1601 02/11/24 2314 02/12/24 0112 02/13/24 0543  02/13/24 0913  WBC 15.3*  --   --  15.6* 8.8 9.4  NEUTROABS 13.3*  --   --   --   --   --   HGB 18.8* 20.1* 15.3 14.9 12.7* 12.7*  HCT 58.2* 59.0* 46.7 46.4 38.7* 38.4*  MCV 93.4  --   --  93.4 94.9 94.1  PLT 288  --   --  264 197 205   Cardiac Enzymes: No results for input(s): CKTOTAL, CKMB, CKMBINDEX, TROPONINI in the last 168 hours. BNP: Invalid input(s): POCBNP CBG: No results for input(s): GLUCAP in the last 168 hours. D-Dimer No results for input(s): DDIMER in the last 72 hours. Hgb A1c Recent Labs    02/12/24 0112  HGBA1C 5.3   Lipid Profile No results for input(s): CHOL, HDL, LDLCALC, TRIG, CHOLHDL, LDLDIRECT in the last 72 hours. Thyroid  function studies No results for input(s): TSH, T4TOTAL, T3FREE, THYROIDAB in the last 72 hours.  Invalid input(s): FREET3 Anemia work up No results for input(s): VITAMINB12, FOLATE, FERRITIN, TIBC, IRON, RETICCTPCT in the last 72 hours. Urinalysis    Component Value Date/Time   COLORURINE YELLOW 02/11/2024 1859   APPEARANCEUR CLOUDY (A) 02/11/2024 1859   LABSPEC 1.016 02/11/2024 1859   PHURINE 5.0 02/11/2024 1859   GLUCOSEU NEGATIVE 02/11/2024 1859   HGBUR LARGE (A) 02/11/2024 1859   BILIRUBINUR NEGATIVE 02/11/2024 1859   KETONESUR NEGATIVE 02/11/2024 1859   PROTEINUR >=300 (A) 02/11/2024 1859   UROBILINOGEN 0.2 05/17/2014 1348   NITRITE NEGATIVE 02/11/2024 1859   LEUKOCYTESUR LARGE (A) 02/11/2024 1859   Sepsis Labs Recent Labs  Lab 02/11/24 1551 02/12/24 0112 02/13/24 0543 02/13/24 0913  WBC 15.3* 15.6* 8.8 9.4   Microbiology No results found for this or any previous visit (from the past 240 hours).  Please note: You were cared for by a hospitalist during your hospital stay. Once you are discharged, your primary care physician will handle any further medical issues. Please note that NO REFILLS for any discharge medications will be authorized once you are discharged,  as it is imperative that you return to your primary care physician (or establish a relationship with a primary care physician if you do not have one) for your post hospital discharge needs so that they can reassess your need for medications and monitor your lab values.    Time coordinating discharge: 40 minutes  SIGNED:   Ivonne Mustache, MD  [facility] 02/13/2024, 10:28 AM Pager [phone]  If 7PM-7AM, please contact night-coverage [email or web address] Password [email or web address]

## 2024-02-14 LAB — GC/CHLAMYDIA PROBE AMP (~~LOC~~) NOT AT ARMC
Chlamydia: NEGATIVE
Comment: NEGATIVE
Comment: NORMAL
Neisseria Gonorrhea: NEGATIVE

## 2024-02-18 ENCOUNTER — Ambulatory Visit: Payer: MEDICAID | Admitting: Family Medicine

## 2024-02-19 ENCOUNTER — Telehealth: Payer: Self-pay | Admitting: Family Medicine

## 2024-02-19 ENCOUNTER — Encounter: Payer: MEDICAID | Admitting: Family

## 2024-02-19 LAB — THC,MS,WB/SP RFX

## 2024-02-19 NOTE — Progress Notes (Signed)
 Erroneous encounter-disregard

## 2024-02-19 NOTE — Telephone Encounter (Signed)
 Called pt and left vm to call office back to reschedule missed appt

## 2024-02-21 LAB — OPIATES,MS,WB/SP RFX
6-Acetylmorphine: NEGATIVE
Codeine: NEGATIVE ng/mL
Dihydrocodeine: NEGATIVE ng/mL
Hydrocodone: NEGATIVE ng/mL
Hydromorphone: NEGATIVE ng/mL
Morphine: 4.7 ng/mL
Opiate Confirmation: POSITIVE

## 2024-02-21 LAB — OXYCODONES,MS,WB/SP RFX
Oxycocone: NEGATIVE ng/mL
Oxycodones Confirmation: NEGATIVE
Oxymorphone: NEGATIVE ng/mL

## 2024-02-27 LAB — DRUG SCREEN 10 W/CONF, SERUM
Amphetamines, IA: NEGATIVE ng/mL
Barbiturates, IA: NEGATIVE ug/mL
Benzodiazepines, IA: NEGATIVE ng/mL
Methadone, IA: NEGATIVE ng/mL
Opiates, IA: POSITIVE ng/mL — AB
Oxycodones, IA: NEGATIVE ng/mL
Phencyclidine, IA: NEGATIVE ng/mL
Propoxyphene, IA: NEGATIVE ng/mL

## 2024-02-27 LAB — COCAINE,MS,WB/SP RFX

## 2024-03-09 ENCOUNTER — Other Ambulatory Visit: Payer: Self-pay

## 2024-03-09 ENCOUNTER — Telehealth: Payer: MEDICAID | Admitting: Nurse Practitioner

## 2024-03-09 DIAGNOSIS — I10 Essential (primary) hypertension: Secondary | ICD-10-CM | POA: Diagnosis not present

## 2024-03-09 DIAGNOSIS — N200 Calculus of kidney: Secondary | ICD-10-CM

## 2024-03-09 DIAGNOSIS — K219 Gastro-esophageal reflux disease without esophagitis: Secondary | ICD-10-CM | POA: Diagnosis not present

## 2024-03-09 DIAGNOSIS — R110 Nausea: Secondary | ICD-10-CM

## 2024-03-09 DIAGNOSIS — F5101 Primary insomnia: Secondary | ICD-10-CM

## 2024-03-09 MED ORDER — TAMSULOSIN HCL 0.4 MG PO CAPS
0.4000 mg | ORAL_CAPSULE | Freq: Every day | ORAL | 0 refills | Status: DC
  Filled 2024-03-09: qty 30, 30d supply, fill #0

## 2024-03-09 MED ORDER — AMLODIPINE BESYLATE 5 MG PO TABS
5.0000 mg | ORAL_TABLET | Freq: Every day | ORAL | 0 refills | Status: DC
  Filled 2024-03-09: qty 30, 30d supply, fill #0

## 2024-03-09 MED ORDER — TRAZODONE HCL 50 MG PO TABS
25.0000 mg | ORAL_TABLET | Freq: Every evening | ORAL | 0 refills | Status: AC | PRN
  Filled 2024-03-09: qty 30, 30d supply, fill #0

## 2024-03-09 MED ORDER — ONDANSETRON 4 MG PO TBDP
4.0000 mg | ORAL_TABLET | Freq: Three times a day (TID) | ORAL | 0 refills | Status: AC | PRN
  Filled 2024-03-09: qty 45, 15d supply, fill #0

## 2024-03-09 MED ORDER — PANTOPRAZOLE SODIUM 40 MG PO TBEC
40.0000 mg | DELAYED_RELEASE_TABLET | Freq: Every day | ORAL | 0 refills | Status: DC
  Filled 2024-03-09: qty 30, 30d supply, fill #0

## 2024-03-09 NOTE — Progress Notes (Signed)
 Acute Video Visit    Virtual Visit Consent:   Daniel [name], you are scheduled for a virtual visit with a [place] provider today.     Just as with appointments in the office, your consent must be obtained to participate.  Your consent will be active for this visit and any virtual visit you may have with one of our providers in the next 365 days.     If you have a MyChart account, a copy of this consent can be sent to you electronically.  All virtual visits are billed to your insurance company just like a traditional visit in the office.    If the connection with a video visit is poor, the visit may have to be switched to a telephone visit.  With either a video or telephone visit, we are not always able to ensure that we have a secure connection.     I need to obtain your verbal consent now.   Are you willing to proceed with your visit today?    Daniel [name] has provided verbal consent on 03/09/2024 for a virtual visit (video or telephone).   [name], FNP  Date: 03/09/2024 1:12 PM  Subjective:     Patient ID: Daniel [name], male    DOB: 1965/04/21, 58 y.o.   MRN: [id number]  ILauraine Kitty, connected with  Daniel [name]  ([id number], 17-Oct-1965) on 03/09/24 at  1:20 PM EST by a video-enabled telemedicine application and verified that I am speaking with the correct person using two identifiers.   Location: Patient: [facility]  Provider: Virtual Visit Location Provider: Home Office   I discussed the limitations of evaluation and management by telemedicine and the availability of in person appointments. The patient expressed understanding and agreed to proceed.      HPI  Daniel [name] is a 58 y.o. who identifies as a male who was assigned male at birth, and is being seen today for initiation of primary care at the [facility].   The patient has been a patient at [facility] in the past, was recently discharged from [facility] rehabilitation.    Presenting to [facility] today to initiate medical care and to seek SDOH resources  Medical history is significant for HTN, chronic gastritis, chronic renal failure, alcohol and tobacco use.   Patient is also seeing a therapist for grief due to multiple family members that he has lost. Denies acute needs today aside from needing to have medications refilled to the pharmacy and transferring his primary care needs to the [facility]      Objective:      Physical Exam Constitutional:      General: He is not in acute distress.    Appearance: Normal appearance. He is not ill-appearing.  HENT:     Nose: Nose normal.     Mouth/Throat:     Mouth: Mucous membranes are moist.  Pulmonary:     Effort: Pulmonary effort is normal.  Musculoskeletal:     Cervical back: Neck supple.  Neurological:     Mental Status: He is alert and oriented to person, place, and time.  Psychiatric:        Mood and Affect: Mood normal.           Assessment & Plan:      1. Primary insomnia (Primary)  - traZODone  (DESYREL ) 50 MG tablet; Take 0.5-1 tablets (25-50 mg total) by mouth at bedtime as needed for sleep.  Dispense: 30 tablet; Refill: 0  2. Gastroesophageal reflux disease without esophagitis  - pantoprazole  (PROTONIX ) 40 MG tablet; Take 1 tablet (40 mg total) by mouth daily.  Dispense: 30 tablet; Refill: 0  3. Nausea  - ondansetron  (ZOFRAN -ODT) 4 MG disintegrating tablet; Take 1 tablet (4 mg total) by mouth every 8 (eight) hours as needed.  Dispense: 45 tablet; Refill: 0  4. Hypertension, unspecified type  - amLODipine  (NORVASC ) 5 MG tablet; Take 1 tablet (5 mg total) by mouth daily.  Dispense: 30 tablet; Refill: 0  5. Nephrolithiasis  - tamsulosin  (FLOMAX ) 0.4 MG CAPS capsule; Take 1 capsule (0.4 mg total) by mouth daily.  Dispense: 30 capsule; Refill: 0   Follow Up Instructions: I discussed the assessment and treatment plan with the patient. The patient was provided an opportunity to ask  questions and all were answered. The patient agreed with the plan and demonstrated an understanding of the instructions.  A copy of instructions were sent to the patient via MyChart unless otherwise noted below.    The patient was advised to call back or seek an in-person evaluation if the symptoms worsen or if the condition fails to improve as anticipated.    [name], FNP  **Disclaimer: This note may have been dictated with voice recognition software. Similar sounding words can inadvertently be transcribed and this note may contain transcription errors which may not have been corrected upon publication of note.**

## 2024-03-18 ENCOUNTER — Other Ambulatory Visit: Payer: Self-pay

## 2024-04-05 ENCOUNTER — Emergency Department (HOSPITAL_COMMUNITY)
Admission: EM | Admit: 2024-04-05 | Discharge: 2024-04-06 | Disposition: A | Discharge status: Home / Self Care | Payer: MEDICAID | Attending: Emergency Medicine | Admitting: Emergency Medicine

## 2024-04-05 DIAGNOSIS — I10 Essential (primary) hypertension: Secondary | ICD-10-CM | POA: Diagnosis not present

## 2024-04-05 DIAGNOSIS — Z79899 Other long term (current) drug therapy: Secondary | ICD-10-CM | POA: Diagnosis not present

## 2024-04-05 DIAGNOSIS — Z720 Tobacco use: Secondary | ICD-10-CM | POA: Diagnosis not present

## 2024-04-05 DIAGNOSIS — R7309 Other abnormal glucose: Secondary | ICD-10-CM | POA: Insufficient documentation

## 2024-04-05 DIAGNOSIS — R1084 Generalized abdominal pain: Secondary | ICD-10-CM | POA: Diagnosis not present

## 2024-04-05 DIAGNOSIS — R109 Unspecified abdominal pain: Secondary | ICD-10-CM

## 2024-04-05 DIAGNOSIS — D72829 Elevated white blood cell count, unspecified: Secondary | ICD-10-CM | POA: Diagnosis not present

## 2024-04-05 DIAGNOSIS — R112 Nausea with vomiting, unspecified: Secondary | ICD-10-CM | POA: Diagnosis present

## 2024-04-05 LAB — URINALYSIS, ROUTINE W REFLEX MICROSCOPIC
Bacteria, UA: NONE SEEN
Bilirubin Urine: NEGATIVE
Glucose, UA: NEGATIVE mg/dL
Hgb urine dipstick: NEGATIVE
Ketones, ur: NEGATIVE mg/dL
Nitrite: NEGATIVE
Protein, ur: NEGATIVE mg/dL
Specific Gravity, Urine: 1.012 (ref 1.005–1.030)
pH: 8 (ref 5.0–8.0)

## 2024-04-05 NOTE — ED Triage Notes (Signed)
 Pt arrived via [facility] from Home, for ab pain nausea and vomiting onset 3hrs, HX of renal failure not dialysis.  Zofran  4mg , 20 R AC. 138/90 99% 78 HR 111 cbg

## 2024-04-06 ENCOUNTER — Other Ambulatory Visit: Payer: Self-pay

## 2024-04-06 ENCOUNTER — Encounter (HOSPITAL_COMMUNITY): Payer: Self-pay

## 2024-04-06 LAB — CBC
HCT: 44 % (ref 39.0–52.0)
Hemoglobin: 14 g/dL (ref 13.0–17.0)
MCH: 31.3 pg (ref 26.0–34.0)
MCHC: 31.8 g/dL (ref 30.0–36.0)
MCV: 98.4 fL (ref 80.0–100.0)
Platelets: 312 K/uL (ref 150–400)
RBC: 4.47 MIL/uL (ref 4.22–5.81)
RDW: 12.8 % (ref 11.5–15.5)
WBC: 13.8 K/uL — ABNORMAL HIGH (ref 4.0–10.5)
nRBC: 0 % (ref 0.0–0.2)

## 2024-04-06 LAB — LIPASE, BLOOD: Lipase: 32 U/L (ref 11–51)

## 2024-04-06 LAB — COMPREHENSIVE METABOLIC PANEL WITH GFR
ALT: 18 U/L (ref 0–44)
AST: 24 U/L (ref 15–41)
Albumin: 4 g/dL (ref 3.5–5.0)
Alkaline Phosphatase: 89 U/L (ref 38–126)
Anion gap: 8 (ref 5–15)
BUN: 10 mg/dL (ref 6–20)
CO2: 29 mmol/L (ref 22–32)
Calcium: 9.2 mg/dL (ref 8.9–10.3)
Chloride: 106 mmol/L (ref 98–111)
Creatinine, Ser: 0.83 mg/dL (ref 0.61–1.24)
GFR, Estimated: >60 mL/min
Glucose, Bld: 119 mg/dL — ABNORMAL HIGH (ref 70–99)
Potassium: 3.8 mmol/L (ref 3.5–5.1)
Sodium: 143 mmol/L (ref 135–145)
Total Bilirubin: 0.2 mg/dL (ref 0.0–1.2)
Total Protein: 7.9 g/dL (ref 6.5–8.1)

## 2024-04-06 LAB — ETHANOL: Alcohol, Ethyl (B): <15 mg/dL

## 2024-04-06 MED ORDER — PANTOPRAZOLE SODIUM 40 MG IV SOLR
40.0000 mg | Freq: Once | INTRAVENOUS | Status: AC
  Administered 2024-04-06: 40 mg via INTRAVENOUS
  Filled 2024-04-06: qty 10

## 2024-04-06 MED ORDER — SODIUM CHLORIDE 0.9 % IV BOLUS
1000.0000 mL | Freq: Once | INTRAVENOUS | Status: AC
  Administered 2024-04-06: 1000 mL via INTRAVENOUS

## 2024-04-06 MED ORDER — DROPERIDOL 2.5 MG/ML IJ SOLN
2.5000 mg | Freq: Once | INTRAMUSCULAR | Status: AC
  Administered 2024-04-06: 2.5 mg via INTRAVENOUS
  Filled 2024-04-06: qty 2

## 2024-04-06 MED ORDER — ONDANSETRON 4 MG PO TBDP
4.0000 mg | ORAL_TABLET | Freq: Three times a day (TID) | ORAL | 0 refills | Status: AC | PRN
  Filled 2024-04-06: qty 15, 5d supply, fill #0
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
## 2024-04-06 NOTE — ED Notes (Signed)
 Reassessment post PO challenge, pt tolerated well.

## 2024-04-14 ENCOUNTER — Other Ambulatory Visit (HOSPITAL_COMMUNITY): Payer: Self-pay

## 2024-04-14 ENCOUNTER — Telehealth: Payer: MEDICAID | Admitting: Nurse Practitioner

## 2024-04-14 ENCOUNTER — Other Ambulatory Visit: Payer: Self-pay

## 2024-04-14 DIAGNOSIS — R110 Nausea: Secondary | ICD-10-CM

## 2024-04-14 DIAGNOSIS — N200 Calculus of kidney: Secondary | ICD-10-CM | POA: Diagnosis not present

## 2024-04-14 DIAGNOSIS — G4700 Insomnia, unspecified: Secondary | ICD-10-CM

## 2024-04-14 DIAGNOSIS — I10 Essential (primary) hypertension: Secondary | ICD-10-CM | POA: Diagnosis not present

## 2024-04-14 DIAGNOSIS — K219 Gastro-esophageal reflux disease without esophagitis: Secondary | ICD-10-CM | POA: Diagnosis not present

## 2024-04-14 MED ORDER — FAMOTIDINE 20 MG PO TABS
20.0000 mg | ORAL_TABLET | Freq: Two times a day (BID) | ORAL | 0 refills | Status: AC | PRN
  Filled 2024-04-14: qty 60, 30d supply, fill #0

## 2024-04-14 MED ORDER — HYDROXYZINE PAMOATE 25 MG PO CAPS
25.0000 mg | ORAL_CAPSULE | Freq: Every evening | ORAL | 0 refills | Status: AC | PRN
  Filled 2024-04-14: qty 30, 30d supply, fill #0

## 2024-04-14 MED ORDER — PANTOPRAZOLE SODIUM 40 MG PO TBEC
40.0000 mg | DELAYED_RELEASE_TABLET | Freq: Every day | ORAL | 0 refills | Status: AC
  Filled 2024-04-14: qty 30, 30d supply, fill #0

## 2024-04-14 MED ORDER — AMLODIPINE BESYLATE 5 MG PO TABS
5.0000 mg | ORAL_TABLET | Freq: Every day | ORAL | 0 refills | Status: AC
  Filled 2024-04-14: qty 30, 30d supply, fill #0

## 2024-04-14 MED ORDER — TAMSULOSIN HCL 0.4 MG PO CAPS
0.4000 mg | ORAL_CAPSULE | Freq: Every day | ORAL | 0 refills | Status: AC
  Filled 2024-04-14: qty 30, 30d supply, fill #0

## 2024-04-14 MED ORDER — ONDANSETRON 4 MG PO TBDP
4.0000 mg | ORAL_TABLET | Freq: Three times a day (TID) | ORAL | 0 refills | Status: AC | PRN
  Filled 2024-04-14: qty 30, 10d supply, fill #0

## 2024-04-14 NOTE — Congregational Nurse Program (Signed)
" °  Dept: [phone]   Congregational Nurse Program Note  Date of Encounter: 04/14/2024  Past Medical History: Past Medical History:  Diagnosis Date   Anxiety    Chronic lower back pain    Depression    Internal hemorrhoids with other complication    Nephrolithiasis    Rectal abscess     Encounter Details:  Community Questionnaire - 04/14/24 1110       Questionnaire   Ask client: Do you give verbal consent for me to treat you today? Yes    Student Assistance N/A    Location Patient Served  [facility]    Encounter Setting CN site    Population Status Unknown    Insurance Medicaid    Insurance/Financial Assistance Referral N/A    Medication Have Medication Insecurities;Patient Medications Reviewed;Provided Medication Assistance    Medical Provider Yes    Screening Referrals Made N/A    Medical Referrals Made [facility]    Medical Appointment Completed [facility]    CNP Interventions Advocate/Support;Navigate Healthcare System;Case Management;Spiritual Care;Educate;Counsel    Screenings CN Performed N/A    ED Visit Averted Yes    Life-Saving Intervention Made Yes         RN asked to see client by Leotis Mort, CSW. Client states he does not have medications and needs refills, but is unable to afford them right now. RN able to schedule telehealth appointment with [name], NP to help aid in medication management,. RN assisted with tele health visit. Medications called to [facility] pharmacy. RN to pick up and bring back to [facility] for client. RN able to provide spiritual and emotional support. No other needs at this time.   "

## 2024-04-14 NOTE — Progress Notes (Signed)
 "  Acute Video Visit    Virtual Visit Consent:   Daniel [name], you are scheduled for a virtual visit with a [facility] provider today.     Just as with appointments in the office, your consent must be obtained to participate.  Your consent will be active for this visit and any virtual visit you may have with one of our providers in the next 365 days.     If you have a MyChart account, a copy of this consent can be sent to you electronically.  All virtual visits are billed to your insurance company just like a traditional visit in the office.    If the connection with a video visit is poor, the visit may have to be switched to a telephone visit.  With either a video or telephone visit, we are not always able to ensure that we have a secure connection.     I need to obtain your verbal consent now.   Are you willing to proceed with your visit today?    Daniel [name] has provided verbal consent on 04/14/2024 for a virtual visit (video or telephone).   [name], FNP  Date: 04/14/2024 11:23 AM  Subjective:     Patient ID: Daniel [name], male    DOB: [birth year], 58 y.o.   MRN: [id number]  ILauraine Kitty, connected with  Bairon Klemann Kincheloe  ([id number], 1965/07/26) on 04/14/2024 at 11:20 AM EST by a video-enabled telemedicine application and verified that I am speaking with the correct person using two identifiers.   Location: Patient: [facility]  Provider: Virtual Visit Location Provider: Home Office   I discussed the limitations of evaluation and management by telemedicine and the availability of in person appointments. The patient expressed understanding and agreed to proceed.     HPI  Daniel [name] is a 58 y.o. who identifies as a male who was assigned male at birth, and is being seen today for recurrent nausea. Appointment today in conjunction with congregational nurse at the [facility] He says that when he does not have his medications his reflux gets much worse causing nausea and vomiting at  times.   States he has heartburn everyday- was recently in the ED with stomach cramping/nausea. Was given Rx for Zofran  but could not afford it   He has also misplaced his daily medications and has not been taking anything regularly for the past few weeks  Feeling nauseated today, also having loose stool   Medical history significant for substance abuse, HTN, chronic gastritis and GERD,   He does see his PCP at [facility] she is not available today so patient was sent through [facility] for acute needs    Review of Systems  Constitutional: Negative.   HENT: Negative.    Respiratory: Negative.    Cardiovascular: Negative.   Gastrointestinal:  Positive for diarrhea, heartburn and nausea.  Skin: Negative.   Neurological: Negative.         Objective:      Physical Exam Constitutional:      General: He is not in acute distress.    Appearance: Normal appearance. He is not ill-appearing.  HENT:     Nose: Nose normal.     Mouth/Throat:     Mouth: Mucous membranes are moist.  Pulmonary:     Effort: Pulmonary effort is normal.  Abdominal:     Tenderness: There is no abdominal tenderness.  Neurological:     Mental Status: He is alert and oriented  to person, place, and time.  Psychiatric:        Mood and Affect: Mood normal.     Results from ED visit reviewed       Assessment & Plan:   1. Hypertension, unspecified type  - amLODipine  (NORVASC ) 5 MG tablet; Take 1 tablet (5 mg total) by mouth daily.  Dispense: 30 tablet; Refill: 0  2. Gastroesophageal reflux disease without esophagitis  - pantoprazole  (PROTONIX ) 40 MG tablet; Take 1 tablet (40 mg total) by mouth daily.  Dispense: 30 tablet; Refill: 0 - famotidine  (PEPCID ) 20 MG tablet; Take 1 tablet (20 mg total) by mouth 2 (two) times daily as needed for heartburn or indigestion.  Dispense: 60 tablet; Refill: 0  3. Nephrolithiasis  - tamsulosin  (FLOMAX ) 0.4 MG CAPS capsule; Take 1 capsule (0.4 mg total) by mouth daily.  Dispense:  30 capsule; Refill: 0  4. Nausea (Primary)  - ondansetron  (ZOFRAN -ODT) 4 MG disintegrating tablet; Take 1 tablet (4 mg total) by mouth every 8 (eight) hours as needed for nausea.  Dispense: 30 tablet; Refill: 0  5. Insomnia, unspecified type  - hydrOXYzine (VISTARIL) 25 MG capsule; Take 1 capsule (25 mg total) by mouth at bedtime as needed (insomnia).  Dispense: 30 capsule; Refill: 0     Follow Up Instructions: I discussed the assessment and treatment plan with the patient. The patient was provided an opportunity to ask questions and all were answered. The patient agreed with the plan and demonstrated an understanding of the instructions.  A copy of instructions were sent to the patient via MyChart unless otherwise noted below.     The patient was advised to call back or seek an in-person evaluation if the symptoms worsen or if the condition fails to improve as anticipated.    [name], FNP  **Disclaimer: This note may have been dictated with voice recognition software. Similar sounding words can inadvertently be transcribed and this note may contain transcription errors which may not have been corrected upon publication of note.** "

## 2024-04-24 NOTE — Progress Notes (Signed)
 The patient attended a screening event on 04/14/2024. Per chart review the patient has Daniel Jenkins Houseman, NP listed as his PCP and has [employer] for health insurance. Chart review also indicates the patient has a food, housing, transportation, tobacco, and utility SDOH need. Per chart review the patient has not seen the listed PCP within the last 12 months, but has had his most recent visits with [name], FNP.  CHW attempted to reach patient for initial f/u but was unable to reach the patient. Letter sent with [employer] and [facility] PCP resource flyers, [place] and [employer] and [facility] financial assistance information flyers . In case needed by pt. Resources in regards to food, housing, transportation, tobacco, and utility assistance were mailed as well.An additional follow up will be done in according to the health equity team's protocol.

## 2024-05-13 NOTE — Congregational Nurse Program (Signed)
" °  Dept: [phone]   Congregational Nurse Program Note  Date of Encounter: 05/13/2024  Past Medical History: Past Medical History:  Diagnosis Date   Anxiety    Chronic lower back pain    Depression    Internal hemorrhoids with other complication    Nephrolithiasis    Rectal abscess     Encounter Details:  Community Questionnaire - 05/13/24 1000       Questionnaire   Ask client: Do you give verbal consent for me to treat you today? Yes    Student Assistance N/A    Location Patient Served  [facility]    Encounter Setting CN site    Population Status Unknown    Insurance Medicaid    Insurance/Financial Assistance Referral N/A    Medication Have Medication Insecurities    Medical Provider Yes    Medical Referrals Made [facility]    Medical Appointment Completed [facility]    Screenings CN Performed (remember to also record results) Vision    CN Interventions Advocate/Support;Navigate Healthcare System    ED Visit Averted N/A         RN saw client at [facility]. Client requesting need for reading glasses. RN able to try  and several strengths to determine which one was best for him. RN also made sure client was aware that [employer] will see client for vision screening and glasses if needed for $4 copay for each. Client denies any other needs at this time.    "
